# Patient Record
Sex: Female | Born: 1955 | Race: Black or African American | Hispanic: No | Smoking: Current every day smoker
Health system: Southern US, Community
[De-identification: ages and names within clinical notes are randomized; demographics above are authoritative.]

## PROBLEM LIST (undated history)

## (undated) DIAGNOSIS — R0989 Other specified symptoms and signs involving the circulatory and respiratory systems: Secondary | ICD-10-CM

## (undated) DIAGNOSIS — E119 Type 2 diabetes mellitus without complications: Secondary | ICD-10-CM

## (undated) DIAGNOSIS — F1020 Alcohol dependence, uncomplicated: Secondary | ICD-10-CM

## (undated) DIAGNOSIS — J449 Chronic obstructive pulmonary disease, unspecified: Secondary | ICD-10-CM

## (undated) DIAGNOSIS — D649 Anemia, unspecified: Secondary | ICD-10-CM

## (undated) DIAGNOSIS — I10 Essential (primary) hypertension: Secondary | ICD-10-CM

## (undated) DIAGNOSIS — F1027 Alcohol dependence with alcohol-induced persisting dementia: Secondary | ICD-10-CM

## (undated) DIAGNOSIS — K859 Acute pancreatitis without necrosis or infection, unspecified: Secondary | ICD-10-CM

## (undated) DIAGNOSIS — K760 Fatty (change of) liver, not elsewhere classified: Secondary | ICD-10-CM

## (undated) HISTORY — PX: ABDOMINAL HYSTERECTOMY: SHX81

---

## 2000-12-01 ENCOUNTER — Inpatient Hospital Stay (HOSPITAL_COMMUNITY): Admission: EM | Admit: 2000-12-01 | Discharge: 2000-12-04 | Payer: Self-pay | Admitting: *Deleted

## 2001-06-20 ENCOUNTER — Emergency Department (HOSPITAL_COMMUNITY): Admission: EM | Admit: 2001-06-20 | Discharge: 2001-06-20 | Payer: Self-pay | Admitting: Emergency Medicine

## 2001-06-20 ENCOUNTER — Encounter: Payer: Self-pay | Admitting: Emergency Medicine

## 2001-07-21 ENCOUNTER — Emergency Department (HOSPITAL_COMMUNITY): Admission: EM | Admit: 2001-07-21 | Discharge: 2001-07-21 | Payer: Self-pay | Admitting: Emergency Medicine

## 2001-12-28 ENCOUNTER — Encounter: Payer: Self-pay | Admitting: Emergency Medicine

## 2001-12-28 ENCOUNTER — Emergency Department (HOSPITAL_COMMUNITY): Admission: EM | Admit: 2001-12-28 | Discharge: 2001-12-28 | Payer: Self-pay | Admitting: Emergency Medicine

## 2002-01-03 ENCOUNTER — Emergency Department (HOSPITAL_COMMUNITY): Admission: EM | Admit: 2002-01-03 | Discharge: 2002-01-03 | Payer: Self-pay | Admitting: *Deleted

## 2002-06-23 ENCOUNTER — Emergency Department (HOSPITAL_COMMUNITY): Admission: EM | Admit: 2002-06-23 | Discharge: 2002-06-23 | Payer: Self-pay | Admitting: *Deleted

## 2002-06-23 ENCOUNTER — Encounter: Payer: Self-pay | Admitting: *Deleted

## 2002-11-24 ENCOUNTER — Emergency Department (HOSPITAL_COMMUNITY): Admission: EM | Admit: 2002-11-24 | Discharge: 2002-11-25 | Payer: Self-pay | Admitting: *Deleted

## 2002-11-25 ENCOUNTER — Encounter: Payer: Self-pay | Admitting: *Deleted

## 2003-09-05 ENCOUNTER — Emergency Department (HOSPITAL_COMMUNITY): Admission: EM | Admit: 2003-09-05 | Discharge: 2003-09-05 | Payer: Self-pay | Admitting: Emergency Medicine

## 2005-02-17 ENCOUNTER — Emergency Department (HOSPITAL_COMMUNITY): Admission: EM | Admit: 2005-02-17 | Discharge: 2005-02-17 | Payer: Self-pay | Admitting: Emergency Medicine

## 2006-02-04 ENCOUNTER — Ambulatory Visit (HOSPITAL_COMMUNITY): Admission: RE | Admit: 2006-02-04 | Discharge: 2006-02-04 | Payer: Self-pay | Admitting: Family Medicine

## 2006-06-03 ENCOUNTER — Emergency Department (HOSPITAL_COMMUNITY): Admission: EM | Admit: 2006-06-03 | Discharge: 2006-06-03 | Payer: Self-pay | Admitting: Emergency Medicine

## 2007-06-01 ENCOUNTER — Emergency Department (HOSPITAL_COMMUNITY): Admission: EM | Admit: 2007-06-01 | Discharge: 2007-06-01 | Payer: Self-pay | Admitting: Emergency Medicine

## 2007-07-12 ENCOUNTER — Emergency Department (HOSPITAL_COMMUNITY): Admission: EM | Admit: 2007-07-12 | Discharge: 2007-07-12 | Payer: Self-pay | Admitting: Emergency Medicine

## 2007-12-06 ENCOUNTER — Emergency Department (HOSPITAL_COMMUNITY): Admission: EM | Admit: 2007-12-06 | Discharge: 2007-12-07 | Payer: Self-pay | Admitting: Emergency Medicine

## 2008-02-09 ENCOUNTER — Emergency Department (HOSPITAL_COMMUNITY): Admission: EM | Admit: 2008-02-09 | Discharge: 2008-02-09 | Payer: Self-pay | Admitting: Emergency Medicine

## 2008-04-25 ENCOUNTER — Ambulatory Visit (HOSPITAL_COMMUNITY): Admission: RE | Admit: 2008-04-25 | Discharge: 2008-04-25 | Payer: Self-pay | Admitting: Family Medicine

## 2010-10-31 NOTE — Discharge Summary (Signed)
[facility]  Patient:    Gloria Santana, Gloria Santana                      MRN: [id number] Adm. Date:  [id number] Disc. Date: [id number] Attending:  Hipolito Bayley J                           Discharge Summary  INTRODUCTION:  [name] is a 55 year old white separated female, mother of three adult children.  She was admitted voluntarily after being referred by the boss, who felt that her depression was getting out of control.  Patient has history of alcoholism and recently she was drinking enough to deserve detox.  She has history of DTs.  Medically, patient suffers from emphysema for several years and she also lost some weight.  Details are available in the admission note.  HOSPITAL COURSE:  After admitting to the ward, patient was placed on special observation and phenobarbital detox.  Vital signs were stable with normal blood pressure, respiration rate and pulse and no overt signs of withdrawal. On the June 20, still kept ruminating about the past and I decided to increase Effexor.  Patient wanted to check HIV and the test was ordered.  She tolerated detox well and, on June 21, reported good sleep, no signs of withdrawal, no dangerous ideas and decreased and discontinued phenobarbital.  On June 22, she presented with bright affect, absence of dangerous ideas, no psychosis, no side effects from medication.  No signs of withdrawal.  It was recommended that patient be discharged home and follow in outpatient treatment.  LABORATORY DATA:  Vital signs, as mentioned before, were stable throughout the hospitalization.  CBC showed slightly decreased number of white blood cells 3.9 and differentiation was basically normal.  Chemistry 17 was normal. Slight elevation of thyroid T3 uptake 13.1 with normal up to 37.  T4 and TSH was within normal limits.  Urinalysis, with exception of some cloudiness, was normal.  DIAGNOSTIC IMPRESSION: Axis I:    1. Major depression,  recurrent, moderate to severe.            2. Alcohol dependence. Axis II:   Personality disorder not otherwise specified. Axis III:  1. Emphysema.            2. History of anemia. Axis IV:   Severe stressors (related to social environment, financial problems            and health problems). Axis V:  DISCHARGE MEDICATIONS: 1. Effexor XR 75 mg, 2 q.a.m. or 1 b.i.d. 2. Seroquel 25 mg, 1/2 tablet p.r.n. for racing thoughts and insomnia. 3. Ferrous sulfate 325 mg, 1 q.d. 4. Multivitamins 1 tablet q.d.  DISCHARGE RECOMMENDATIONS:  She should not drink alcohol.  Follow with AA groups.  Can return to work on December 06, 2000.  Patient has appointment scheduled with [facility] and is supposed to call to confirm appointment which she scheduled December 14, 2000.  Patient understood instructions and, at the time of discharge, did not have any side effects from medication and, in good condition, was discharged home. DD:  01/12/01 TD:  01/15/01 Job: [id number]
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
## 2011-03-12 LAB — DIFFERENTIAL
Band Neutrophils: 1
Basophils Relative: 2 — ABNORMAL HIGH
Lymphs Abs: 2.4
Metamyelocytes Relative: 0
Myelocytes: 0
Neutro Abs: 1.7
nRBC: 0

## 2011-03-12 LAB — BASIC METABOLIC PANEL
Chloride: 91 — ABNORMAL LOW
GFR calc Af Amer: >60
Glucose, Bld: 83
Potassium: 3.7
Sodium: 124 — ABNORMAL LOW

## 2011-03-12 LAB — POCT CARDIAC MARKERS
CKMB, poc: <1 — ABNORMAL LOW
Myoglobin, poc: 41.6
Operator id: 217151
Troponin i, poc: 0.07 — ABNORMAL HIGH

## 2011-03-12 LAB — CBC
MCV: 85.7
RBC: 4.17

## 2011-10-16 ENCOUNTER — Emergency Department (HOSPITAL_COMMUNITY)
Admission: EM | Admit: 2011-10-16 | Discharge: 2011-10-16 | Disposition: A | Discharge status: Home / Self Care | Payer: Self-pay | Attending: Emergency Medicine | Admitting: Emergency Medicine

## 2011-10-16 ENCOUNTER — Encounter (HOSPITAL_COMMUNITY): Payer: Self-pay | Admitting: *Deleted

## 2011-10-16 DIAGNOSIS — J449 Chronic obstructive pulmonary disease, unspecified: Secondary | ICD-10-CM | POA: Insufficient documentation

## 2011-10-16 DIAGNOSIS — F17200 Nicotine dependence, unspecified, uncomplicated: Secondary | ICD-10-CM | POA: Insufficient documentation

## 2011-10-16 DIAGNOSIS — M79609 Pain in unspecified limb: Secondary | ICD-10-CM | POA: Insufficient documentation

## 2011-10-16 DIAGNOSIS — J4489 Other specified chronic obstructive pulmonary disease: Secondary | ICD-10-CM | POA: Insufficient documentation

## 2011-10-16 DIAGNOSIS — L089 Local infection of the skin and subcutaneous tissue, unspecified: Secondary | ICD-10-CM | POA: Insufficient documentation

## 2011-10-16 HISTORY — DX: Chronic obstructive pulmonary disease, unspecified: J44.9

## 2011-10-16 MED ORDER — MICONAZOLE NITRATE 2 % EX AERP: INHALATION_SPRAY | CUTANEOUS | Status: DC

## 2011-10-16 MED ORDER — PENICILLIN V POTASSIUM 500 MG PO TABS: ORAL_TABLET | ORAL | Status: DC

## 2011-10-16 MED ORDER — PENICILLIN V POTASSIUM 250 MG PO TABS
500.0000 mg | ORAL_TABLET | Freq: Once | ORAL | Status: AC
  Administered 2011-10-16: 500 mg via ORAL
  Filled 2011-10-16: qty 2

## 2011-10-16 MED ORDER — ONDANSETRON HCL 4 MG PO TABS
4.0000 mg | ORAL_TABLET | Freq: Once | ORAL | Status: AC
  Administered 2011-10-16: 4 mg via ORAL
  Filled 2011-10-16: qty 1

## 2011-10-16 MED ORDER — FLUCONAZOLE 100 MG PO TABS
150.0000 mg | ORAL_TABLET | Freq: Once | ORAL | Status: AC
  Administered 2011-10-16: 150 mg via ORAL
  Filled 2011-10-16: qty 2

## 2011-10-16 NOTE — ED Provider Notes (Addendum)
History     CSN: [id number]  Arrival date & time 10/16/11  2126   First MD Initiated Contact with Patient 10/16/11 2148      Chief Complaint  Patient presents with  . Foot Pain    (Consider location/radiation/quality/duration/timing/severity/associated sxs/prior treatment) HPI Comments: Pt reports a 6 month hx of problems with soreness to the plantar surface of the right foot. Most recently she has noted lesions between the toes and now has some increase redness of the dorsum of the feet. No fever reported. Pain with walking at times.  The history is provided by the patient.    Past Medical History  Diagnosis Date  . COPD (chronic obstructive pulmonary disease)     Past Surgical History  Procedure Date  . Abdominal hysterectomy     Family History  Problem Relation Age of Onset  . Diabetes Mother     History  Substance Use Topics  . Smoking status: Current Everyday Smoker  . Smokeless tobacco: Not on file  . Alcohol Use: No    OB History    Grav Para Term Preterm Abortions TAB SAB Ect Mult Living                  Review of Systems  Constitutional: Negative for activity change.       All ROS Neg except as noted in HPI  HENT: Negative for nosebleeds and neck pain.   Eyes: Negative for photophobia and discharge.  Respiratory: Positive for shortness of breath. Negative for cough and wheezing.   Cardiovascular: Negative for chest pain and palpitations.  Gastrointestinal: Negative for abdominal pain and blood in stool.  Genitourinary: Negative for dysuria, frequency and hematuria.  Musculoskeletal: Negative for back pain and arthralgias.  Skin: Negative.   Neurological: Negative for dizziness, seizures and speech difficulty.  Psychiatric/Behavioral: Negative for hallucinations and confusion.    Allergies  Review of patient's allergies indicates no known allergies.  Home Medications  No current outpatient prescriptions on file.  BP 134/84  Pulse 69   Temp(Src) 97.4 F (36.3 C) (Oral)  Resp 20  Ht 5\' 7"  (1.702 m)  Wt 120 lb (54.432 kg)  BMI 18.79 kg/m2  SpO2 100%  Physical Exam  Nursing note and vitals reviewed. Constitutional: She is oriented to person, place, and time. She appears well-developed and well-nourished.  Non-toxic appearance.  HENT:  Head: Normocephalic.  Right Ear: Tympanic membrane and external ear normal.  Left Ear: Tympanic membrane and external ear normal.  Eyes: EOM and lids are normal. Pupils are equal, round, and reactive to light.  Neck: Normal range of motion. Neck supple. Carotid bruit is not present.  Cardiovascular: Normal rate, regular rhythm, normal heart sounds, intact distal pulses and normal pulses.   Pulmonary/Chest: Breath sounds normal. No respiratory distress.       Course breath sounds. No rales or wheezes.  Abdominal: Soft. Bowel sounds are normal. There is no tenderness. There is no guarding.  Musculoskeletal: Normal range of motion.       Scaling of skin of the plantar surface of the rt and left feet. Lesions between 2nd and 3rd toes. Mild to mod increase redness of the dorsum of the right foot. No streaking. No hot. DP difficult to palpate.  Lymphadenopathy:       Head (right side): No submandibular adenopathy present.       Head (left side): No submandibular adenopathy present.    She has no cervical adenopathy.  Neurological: She is alert and  oriented to person, place, and time. She has normal strength. No cranial nerve deficit or sensory deficit.  Skin: Skin is warm and dry.  Psychiatric: She has a normal mood and affect. Her speech is normal.    ED Course  Procedures: BEDSIDE DOPPLER OF LOWER EXTREMITIES -  The patient has a monophasic signal of the right and left dorsalis pedis. The left posterior tibial pulse is also monophasic. The right posterior tibial pulse is biphasic.  Labs Reviewed - No data to display No results found.   No diagnosis found.    MDM  I have reviewed  nursing notes, vital signs, and all appropriate lab and imaging results for this patient.  Pt believed to have a fungal infection with a secondary infection present. Will treat with diflucan and antifungal powder and penicillin. Pt to have feet rechecked in 3 days.       Kathie Dike, PA 10/16/11 2248  Kathie Dike, PA 01/08/12 0200  Kathie Dike, PA 02/12/12 1150

## 2011-10-16 NOTE — ED Provider Notes (Signed)
Medical screening examination/treatment/procedure(s) were performed by non-physician practitioner and as supervising physician I was immediately available for consultation/collaboration.   Glynn Octave, MD 10/16/11 2340

## 2011-10-16 NOTE — ED Notes (Signed)
Pt presents with foul odor rash in between toes on rt foot. Pt denies pain at this time however states is just uncomfortable, burning and itches. Pt denies being a diabetic. No open sores or ulcers noted. Pt reports standing at work all day and wears tennis shoes.

## 2011-10-16 NOTE — ED Notes (Signed)
Sore areas to plantar surface of rt foot and between toes for 6 mos.  Has been soaking foot.and using cremes.

## 2011-10-16 NOTE — Discharge Instructions (Signed)
Please dry feet carefully. Apply miconazole powder each morning and evening. Penicillin 2 times daily after a meal. Use a baby aspirin daily. Please return to the Emergency Dept. On Monday May 6th for recheck of your feet.

## 2011-10-18 ENCOUNTER — Other Ambulatory Visit: Payer: Self-pay

## 2012-01-08 NOTE — ED Provider Notes (Signed)
Medical screening examination/treatment/procedure(s) were performed by non-physician practitioner and as supervising physician I was immediately available for consultation/collaboration.   Glynn Octave, MD 01/08/12 [phone]

## 2012-02-15 NOTE — ED Provider Notes (Signed)
Medical screening examination/treatment/procedure(s) were performed by non-physician practitioner and as supervising physician I was immediately available for consultation/collaboration.   Raynald Rouillard, MD 02/15/12 1501 

## 2012-06-16 ENCOUNTER — Emergency Department (HOSPITAL_COMMUNITY)
Admission: EM | Admit: 2012-06-16 | Discharge: 2012-06-16 | Disposition: A | Discharge status: Home / Self Care | Payer: Self-pay | Attending: Emergency Medicine | Admitting: Emergency Medicine

## 2012-06-16 ENCOUNTER — Emergency Department (HOSPITAL_COMMUNITY): Payer: Self-pay

## 2012-06-16 ENCOUNTER — Encounter (HOSPITAL_COMMUNITY): Payer: Self-pay | Admitting: *Deleted

## 2012-06-16 DIAGNOSIS — J4489 Other specified chronic obstructive pulmonary disease: Secondary | ICD-10-CM | POA: Insufficient documentation

## 2012-06-16 DIAGNOSIS — Y92009 Unspecified place in unspecified non-institutional (private) residence as the place of occurrence of the external cause: Secondary | ICD-10-CM | POA: Insufficient documentation

## 2012-06-16 DIAGNOSIS — S1093XA Contusion of unspecified part of neck, initial encounter: Secondary | ICD-10-CM | POA: Insufficient documentation

## 2012-06-16 DIAGNOSIS — W1809XA Striking against other object with subsequent fall, initial encounter: Secondary | ICD-10-CM | POA: Insufficient documentation

## 2012-06-16 DIAGNOSIS — F17200 Nicotine dependence, unspecified, uncomplicated: Secondary | ICD-10-CM | POA: Insufficient documentation

## 2012-06-16 DIAGNOSIS — R1110 Vomiting, unspecified: Secondary | ICD-10-CM | POA: Insufficient documentation

## 2012-06-16 DIAGNOSIS — J449 Chronic obstructive pulmonary disease, unspecified: Secondary | ICD-10-CM | POA: Insufficient documentation

## 2012-06-16 DIAGNOSIS — F1010 Alcohol abuse, uncomplicated: Secondary | ICD-10-CM

## 2012-06-16 DIAGNOSIS — Y9301 Activity, walking, marching and hiking: Secondary | ICD-10-CM | POA: Insufficient documentation

## 2012-06-16 DIAGNOSIS — S0093XA Contusion of unspecified part of head, initial encounter: Secondary | ICD-10-CM

## 2012-06-16 DIAGNOSIS — S0003XA Contusion of scalp, initial encounter: Secondary | ICD-10-CM | POA: Insufficient documentation

## 2012-06-16 NOTE — ED Provider Notes (Signed)
History  This chart was scribed for Ward Givens, MD by Bennett Scrape, ED Scribe. This patient was seen in room APA16A/APA16A and the patient's care was started at 4:31 PM.  CSN: [id number]  Arrival date & time 06/16/12  1616   First MD Initiated Contact with Patient 06/16/12 1631      Chief Complaint  Patient presents with  . Headache     Patient is a 57 y.o. female presenting with headaches. The history is provided by the patient. No language interpreter was used.  Headache  This is a new problem. The current episode started 2 days ago. The problem has not changed since onset.The headache is associated with nothing. The pain is located in the right unilateral region. The pain does not radiate. Associated symptoms include vomiting. Pertinent negatives include no fever, no shortness of breath and no nausea. She has tried nothing for the symptoms.    Gloria Santana is a 57 y.o. female who presents to the Emergency Department complaining of intermittent HAs described as pressure lasting 10 to 15 minutes each after hitting the right side of her head after a fall 2 days ago while trying to walk to her home bathroom. She reports that she hit her head on her dresser and states that she had been drinking alcohol that night. She denies LOC.  She reports that eating improves her HA. She denies anything making it feel better, but hasn't tried anything. She denies photophobia or noise sensitvity.  She reports 2 episodes of emesis that is not always with the HAs for the past 2 days. She reports that the HAs are similar to prior HAs and she denies taking OTC medications at home to improve her symptoms. She denies neck pain, trouble walking, visual disturbances, diarrhea, abdominal pain    She has a h/o COPD and denies being on daily medciations. She is a current everyday 2 ppd smoker and everyday alcohol user (2 to 3 40oz beers daily). Pt reports that she has been drinking more than that since losing her  job 6 months ago. She states that she has been to detox for 3 days several years ago but resumed drinking.  [facility] is [facility] at the [facility].  Past Medical History  Diagnosis Date  . COPD (chronic obstructive pulmonary disease)     Past Surgical History  Procedure Date  . Abdominal hysterectomy     Family History  Problem Relation Age of Onset  . Diabetes Mother     History  Substance Use Topics  . Smoking status: Current Every Day Smoker  . Smokeless tobacco: Not on file  . Alcohol Use: Yes  Used to work at [employer] since July Lives at home Lives with daughters  No OB history provided.  Review of Systems  Constitutional: Negative for fever and chills.  Eyes: Negative for visual disturbance.  Respiratory: Negative for shortness of breath.   Cardiovascular: Negative for chest pain.  Gastrointestinal: Positive for vomiting. Negative for nausea, abdominal pain and diarrhea.  Neurological: Positive for headaches. Negative for dizziness and syncope.  All other systems reviewed and are negative.    Allergies  Review of patient's allergies indicates no known allergies.  Home Medications   No current outpatient prescriptions on file.  Triage Vitals: BP 127/95  Pulse 93  Temp 97.9 F (36.6 C) (Oral)  Resp 18  Ht 5\' 9"  (1.753 m)  Wt 120 lb (54.432 kg)  BMI 17.72 kg/m2  SpO2 99%  Vital signs normal    Physical Exam  Nursing note and vitals reviewed. Constitutional: She is oriented to person, place, and time. She appears well-developed and well-nourished.  Non-toxic appearance. She does not appear ill. No distress.  HENT:  Head: Normocephalic and atraumatic.  Right Ear: External ear normal.  Left Ear: External ear normal.  Nose: Nose normal. No mucosal edema or rhinorrhea.  Mouth/Throat: Oropharynx is clear and moist and mucous membranes are normal. No dental abscesses or uvula swelling.  Eyes: Conjunctivae normal and EOM are normal. Pupils  are equal, round, and reactive to light.  Neck: Normal range of motion and full passive range of motion without pain. Neck supple.  Cardiovascular: Normal rate, regular rhythm and normal heart sounds.  Exam reveals no gallop and no friction rub.   No murmur heard. Pulmonary/Chest: Effort normal. No respiratory distress. She has no wheezes. She has no rhonchi. She has no rales. She exhibits no tenderness and no crepitus.       Diminished lung sounds and diffuse rhonchi  Abdominal: Soft. Normal appearance and bowel sounds are normal. She exhibits no distension. There is no tenderness. There is no rebound and no guarding.  Musculoskeletal: Normal range of motion. She exhibits no edema and no tenderness.       Moves all extremities well.   Neurological: She is alert and oriented to person, place, and time. She has normal strength. No cranial nerve deficit.  Skin: Skin is warm, dry and intact. No rash noted. No erythema. No pallor.  Psychiatric: She has a normal mood and affect. Her speech is normal and behavior is normal. Her mood appears not anxious.    ED Course  Procedures (including critical care time)  DIAGNOSTIC STUDIES: Oxygen Saturation is 99% on room air, normal by my interpretation.    COORDINATION OF CARE: 4:56 PM- Advised pt that she needs to quit smoking and drinking alcohol. Pt turned down detox stating that she can quit drinking on her own; however, family states that they will try to convince her to take the help. Pt went to [facility] in [place] 3 years ago, but doesn't want to go back now. Discussed treatment plan which includes CT scan in the head with pt at bedside and pt agreed to plan.  6:47 PM- CT has resulted is PACS and is negative but has not crossed over into EPIC  6:54 PM-Pt rechecked and feels improved. Informed pt of CT results. Pt is still declining detox help. Discussed discharge plan with pt and pt agreed.    CT head Negative for acute intracranial  problem.   1. Contusion of head   2. Alcohol abuse    Plan discharge  Devoria Albe, MD, FACEP    MDM   I personally performed the services described in this documentation, which was scribed in my presence. The recorded information has been reviewed and considered.  Devoria Albe, MD, FACEP        Ward Givens, MD 06/16/12 [phone]

## 2012-06-16 NOTE — ED Notes (Signed)
Fell 2 days ago , struck lt side of head on dresser.  Headache since then.  No n/v.

## 2012-09-30 ENCOUNTER — Emergency Department (HOSPITAL_COMMUNITY)
Admission: EM | Admit: 2012-09-30 | Discharge: 2012-09-30 | Disposition: A | Discharge status: Home / Self Care | Payer: No Typology Code available for payment source | Attending: Emergency Medicine | Admitting: Emergency Medicine

## 2012-09-30 ENCOUNTER — Encounter (HOSPITAL_COMMUNITY): Payer: Self-pay | Admitting: *Deleted

## 2012-09-30 DIAGNOSIS — J449 Chronic obstructive pulmonary disease, unspecified: Secondary | ICD-10-CM | POA: Insufficient documentation

## 2012-09-30 DIAGNOSIS — F17200 Nicotine dependence, unspecified, uncomplicated: Secondary | ICD-10-CM | POA: Insufficient documentation

## 2012-09-30 DIAGNOSIS — J3489 Other specified disorders of nose and nasal sinuses: Secondary | ICD-10-CM | POA: Insufficient documentation

## 2012-09-30 DIAGNOSIS — J329 Chronic sinusitis, unspecified: Secondary | ICD-10-CM | POA: Insufficient documentation

## 2012-09-30 DIAGNOSIS — R040 Epistaxis: Secondary | ICD-10-CM | POA: Insufficient documentation

## 2012-09-30 DIAGNOSIS — J4489 Other specified chronic obstructive pulmonary disease: Secondary | ICD-10-CM | POA: Insufficient documentation

## 2012-09-30 LAB — CBC WITH DIFFERENTIAL/PLATELET
Basophils Absolute: 0 10*3/uL (ref 0.0–0.1)
Eosinophils Absolute: 0.1 10*3/uL (ref 0.0–0.7)
Eosinophils Relative: 3 % (ref 0–5)
Hemoglobin: 12 g/dL (ref 12.0–15.0)
Lymphs Abs: 1.4 10*3/uL (ref 0.7–4.0)
MCH: 28.3 pg (ref 26.0–34.0)
MCHC: 34.9 g/dL (ref 30.0–36.0)
Monocytes Relative: 12 % (ref 3–12)
Neutrophils Relative %: 40 % — ABNORMAL LOW (ref 43–77)
RBC: 4.24 MIL/uL (ref 3.87–5.11)

## 2012-09-30 LAB — PROTIME-INR
INR: 1.18 (ref 0.00–1.49)
Prothrombin Time: 14.8 seconds (ref 11.6–15.2)

## 2012-09-30 LAB — BASIC METABOLIC PANEL
BUN: 8 mg/dL (ref 6–23)
Glucose, Bld: 83 mg/dL (ref 70–99)
Potassium: 4.3 mEq/L (ref 3.5–5.1)

## 2012-09-30 MED ORDER — AZITHROMYCIN 250 MG PO TABS: ORAL_TABLET | ORAL | Status: DC

## 2012-09-30 MED ORDER — SILVER NITRATE-POT NITRATE 75-25 % EX MISC
1.0000 | Freq: Once | CUTANEOUS | Status: AC
  Administered 2012-09-30: 1 via TOPICAL
  Filled 2012-09-30: qty 1

## 2012-09-30 NOTE — ED Notes (Addendum)
Nosebleed from lt nostril  X 4 episodes   For 1 week.    Nasal congestion with yellow mucus.   No bleeding at present

## 2012-09-30 NOTE — ED Notes (Signed)
Pt seen and evaluated by EDPa for initial assessment. 

## 2012-10-02 NOTE — ED Provider Notes (Signed)
History     CSN: [id number]  Arrival date & time 09/30/12  1540   First MD Initiated Contact with Patient 09/30/12 1629      Chief Complaint  Patient presents with  . Epistaxis    (Consider location/radiation/quality/duration/timing/severity/associated sxs/prior treatment) Patient is a 57 y.o. female presenting with nosebleeds. The history is provided by the patient.  Epistaxis  This is a new problem. The current episode started more than 2 days ago. Episode frequency: intermittently. The problem has not changed since onset.The problem is associated with an unknown (sinus pressure, nasal congestion, sneezing) factor. The bleeding has been from the left nare. She has tried applying pressure for the symptoms. The treatment provided mild relief. Her past medical history is significant for colds and sinus problems. Her past medical history does not include nose-picking or frequent nosebleeds.    Past Medical History  Diagnosis Date  . COPD (chronic obstructive pulmonary disease)     Past Surgical History  Procedure Laterality Date  . Abdominal hysterectomy      Family History  Problem Relation Age of Onset  . Diabetes Mother     History  Substance Use Topics  . Smoking status: Current Every Day Smoker  . Smokeless tobacco: Not on file  . Alcohol Use: Yes    OB History   Grav Para Term Preterm Abortions TAB SAB Ect Mult Living                  Review of Systems  Constitutional: Negative for fever, chills, activity change and appetite change.  HENT: Positive for nosebleeds, congestion, rhinorrhea, sneezing and sinus pressure. Negative for sore throat, facial swelling, trouble swallowing, neck pain, neck stiffness and tinnitus.   Respiratory: Negative for chest tightness and shortness of breath.   Cardiovascular: Negative for chest pain.  Skin: Negative for rash.  Neurological: Negative for syncope, facial asymmetry, speech difficulty, weakness, light-headedness,  numbness and headaches.  Hematological: Does not bruise/bleed easily.  All other systems reviewed and are negative.    Allergies  Review of patient's allergies indicates no known allergies.  Home Medications   Current Outpatient Rx  Name  Route  Sig  Dispense  Refill  . aspirin-sod bicarb-citric acid (ALKA-SELTZER) 325 MG TBEF   Oral   Take 325 mg by mouth daily as needed.         Marland Kitchen ibuprofen (ADVIL,MOTRIN) 200 MG tablet   Oral   Take 400 mg by mouth daily as needed for pain.         . Multiple Vitamin (MULTIVITAMIN WITH MINERALS) TABS   Oral   Take 1 tablet by mouth daily.         Marland Kitchen azithromycin (ZITHROMAX Z-PAK) 250 MG tablet      Take two tablets on day one, then one tab qd days 2-5   6 tablet   0     BP 128/93  Pulse 90  Temp(Src) 98.4 F (36.9 C) (Oral)  Resp 18  Ht 5' 7.5" (1.715 m)  Wt 120 lb (54.432 kg)  BMI 18.51 kg/m2  SpO2 100%  Physical Exam  Nursing note and vitals reviewed. Constitutional: She is oriented to person, place, and time. She appears well-developed and well-nourished. No distress.  HENT:  Head: Normocephalic and atraumatic. No trismus in the jaw.  Right Ear: Tympanic membrane and ear canal normal.  Left Ear: Tympanic membrane and ear canal normal.  Nose: Mucosal edema and rhinorrhea present. No septal deviation or nasal septal hematoma.  Epistaxis is observed. Right sinus exhibits maxillary sinus tenderness. Left sinus exhibits maxillary sinus tenderness.  Mouth/Throat: Uvula is midline and mucous membranes are normal. No edematous. No oropharyngeal exudate, posterior oropharyngeal edema, posterior oropharyngeal erythema or tonsillar abscesses.  Abrasion to left septum.  Scant bleeding present.    Neck: Normal range of motion and phonation normal. Neck supple. No Brudzinski's sign and no Kernig's sign noted.  Cardiovascular: Normal rate, regular rhythm, normal heart sounds and intact distal pulses.   No murmur  heard. Pulmonary/Chest: Effort normal and breath sounds normal. She has no wheezes. She has no rales.  Musculoskeletal: Normal range of motion. She exhibits no edema.  Lymphadenopathy:    She has no cervical adenopathy.  Neurological: She is alert and oriented to person, place, and time. She exhibits normal muscle tone. Coordination normal.  Skin: Skin is warm and dry.    ED Course  EPISTAXIS MANAGEMENT Date/Time: 09/30/2012 6:10 PM Performed by: Trisha Mangle, Jaymason Ledesma L. Authorized by: Maxwell Caul Consent: Verbal consent obtained. written consent not obtained. Consent given by: patient Patient understanding: patient states understanding of the procedure being performed Patient identity confirmed: verbally with patient and arm band Time out: Immediately prior to procedure a "time out" was called to verify the correct patient, procedure, equipment, support staff and site/side marked as required. Anesthesia method: none. Patient sedated: no Treatment site: left anterior Repair method: silver nitrate Post-procedure assessment: bleeding stopped Treatment complexity: simple Patient tolerance: Patient tolerated the procedure well with no immediate complications.   (including critical care time)  Labs Reviewed  CBC WITH DIFFERENTIAL - Abnormal; Notable for the following:    WBC 3.1 (*)    HCT 34.4 (*)    Neutrophils Relative 40 (*)    Neutro Abs 1.2 (*)    All other components within normal limits  PROTIME-INR  BASIC METABOLIC PANEL     1. Left-sided epistaxis   2. Sinusitis       MDM     Minimal epistaxis of the left nostril secondary to septal abrasion.  Bleeding resolved after application of silver nitrate.  Airway patent.  Pt advised to avoid blowing her nose or sneezing for 24-48 hrs.  She agrees to return here if sx's return.  abrasion likely occurred secondary to excessive sneezing and blowing her nose.  PT, H&H are nml, no vertiginous sx's.  Pt is well appearing  and appears stable for discharge.    Tkai Large L. Trisha Mangle, PA-C 10/02/12 1916

## 2012-10-05 NOTE — ED Provider Notes (Signed)
Medical screening examination/treatment/procedure(s) were performed by non-physician practitioner and as supervising physician I was immediately available for consultation/collaboration. Kalvyn Desa, MD, FACEP   Chancelor Hardrick L Shlome Baldree, MD 10/05/12 1050 

## 2013-01-16 ENCOUNTER — Encounter (HOSPITAL_COMMUNITY): Payer: Self-pay

## 2013-01-16 ENCOUNTER — Emergency Department (HOSPITAL_COMMUNITY)
Admission: EM | Admit: 2013-01-16 | Discharge: 2013-01-16 | Discharge status: Against Medical Advice | Payer: No Typology Code available for payment source | Attending: Emergency Medicine | Admitting: Emergency Medicine

## 2013-01-16 ENCOUNTER — Emergency Department (HOSPITAL_COMMUNITY): Payer: No Typology Code available for payment source

## 2013-01-16 DIAGNOSIS — J4489 Other specified chronic obstructive pulmonary disease: Secondary | ICD-10-CM | POA: Insufficient documentation

## 2013-01-16 DIAGNOSIS — IMO0002 Reserved for concepts with insufficient information to code with codable children: Secondary | ICD-10-CM | POA: Insufficient documentation

## 2013-01-16 DIAGNOSIS — R062 Wheezing: Secondary | ICD-10-CM | POA: Insufficient documentation

## 2013-01-16 DIAGNOSIS — Y939 Activity, unspecified: Secondary | ICD-10-CM | POA: Insufficient documentation

## 2013-01-16 DIAGNOSIS — Y92009 Unspecified place in unspecified non-institutional (private) residence as the place of occurrence of the external cause: Secondary | ICD-10-CM | POA: Insufficient documentation

## 2013-01-16 DIAGNOSIS — F17200 Nicotine dependence, unspecified, uncomplicated: Secondary | ICD-10-CM | POA: Insufficient documentation

## 2013-01-16 DIAGNOSIS — S21209A Unspecified open wound of unspecified back wall of thorax without penetration into thoracic cavity, initial encounter: Secondary | ICD-10-CM | POA: Insufficient documentation

## 2013-01-16 DIAGNOSIS — S298XXA Other specified injuries of thorax, initial encounter: Secondary | ICD-10-CM | POA: Insufficient documentation

## 2013-01-16 DIAGNOSIS — W19XXXA Unspecified fall, initial encounter: Secondary | ICD-10-CM

## 2013-01-16 DIAGNOSIS — S41109A Unspecified open wound of unspecified upper arm, initial encounter: Secondary | ICD-10-CM | POA: Insufficient documentation

## 2013-01-16 DIAGNOSIS — Z79899 Other long term (current) drug therapy: Secondary | ICD-10-CM | POA: Insufficient documentation

## 2013-01-16 DIAGNOSIS — J449 Chronic obstructive pulmonary disease, unspecified: Secondary | ICD-10-CM | POA: Insufficient documentation

## 2013-01-16 DIAGNOSIS — W1809XA Striking against other object with subsequent fall, initial encounter: Secondary | ICD-10-CM | POA: Insufficient documentation

## 2013-01-16 MED ORDER — TETANUS-DIPHTH-ACELL PERTUSSIS 5-2.5-18.5 LF-MCG/0.5 IM SUSP: 0.5000 mL | Freq: Once | INTRAMUSCULAR | Status: DC

## 2013-01-16 NOTE — ED Provider Notes (Signed)
Medical screening examination/treatment/procedure(s) were performed by non-physician practitioner and as supervising physician I was immediately available for consultation/collaboration.  Doug Sou, MD 01/16/13 [phone]
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
## 2013-01-16 NOTE — ED Notes (Signed)
Pt is out in hallway, says she can take care of herself.and walked out.
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
## 2013-04-13 ENCOUNTER — Emergency Department (HOSPITAL_COMMUNITY)
Admission: EM | Admit: 2013-04-13 | Discharge: 2013-04-13 | Disposition: A | Discharge status: Home / Self Care | Payer: No Typology Code available for payment source | Attending: Emergency Medicine | Admitting: Emergency Medicine

## 2013-04-13 ENCOUNTER — Emergency Department (HOSPITAL_COMMUNITY): Payer: No Typology Code available for payment source

## 2013-04-13 ENCOUNTER — Encounter (HOSPITAL_COMMUNITY): Payer: Self-pay | Admitting: Emergency Medicine

## 2013-04-13 ENCOUNTER — Emergency Department (HOSPITAL_COMMUNITY): Payer: Self-pay

## 2013-04-13 DIAGNOSIS — J40 Bronchitis, not specified as acute or chronic: Secondary | ICD-10-CM

## 2013-04-13 DIAGNOSIS — J449 Chronic obstructive pulmonary disease, unspecified: Secondary | ICD-10-CM

## 2013-04-13 DIAGNOSIS — J441 Chronic obstructive pulmonary disease with (acute) exacerbation: Secondary | ICD-10-CM | POA: Insufficient documentation

## 2013-04-13 DIAGNOSIS — F17200 Nicotine dependence, unspecified, uncomplicated: Secondary | ICD-10-CM | POA: Insufficient documentation

## 2013-04-13 DIAGNOSIS — E041 Nontoxic single thyroid nodule: Secondary | ICD-10-CM | POA: Insufficient documentation

## 2013-04-13 LAB — BASIC METABOLIC PANEL
CO2: 29 mEq/L (ref 19–32)
Calcium: 9.8 mg/dL (ref 8.4–10.5)
Creatinine, Ser: 0.73 mg/dL (ref 0.50–1.10)
GFR calc Af Amer: >90 mL/min (ref >90)
GFR calc non Af Amer: >90 mL/min (ref >90)
Glucose, Bld: 77 mg/dL (ref 70–99)
Sodium: 137 mEq/L (ref 135–145)

## 2013-04-13 LAB — CBC WITH DIFFERENTIAL/PLATELET
Basophils Absolute: 0 10*3/uL (ref 0.0–0.1)
Basophils Relative: 1 % (ref 0–1)
Eosinophils Relative: 3 % (ref 0–5)
MCH: 28.9 pg (ref 26.0–34.0)
MCV: 83.8 fL (ref 78.0–100.0)
Monocytes Absolute: 0.6 10*3/uL (ref 0.1–1.0)
Monocytes Relative: 16 % — ABNORMAL HIGH (ref 3–12)
Neutrophils Relative %: 33 % — ABNORMAL LOW (ref 43–77)

## 2013-04-13 MED ORDER — PREDNISONE 20 MG PO TABS: ORAL_TABLET | ORAL | Status: DC

## 2013-04-13 MED ORDER — AZITHROMYCIN 250 MG PO TABS: ORAL_TABLET | ORAL | Status: DC

## 2013-04-13 MED ORDER — IOHEXOL 300 MG/ML  SOLN
80.0000 mL | Freq: Once | INTRAMUSCULAR | Status: AC | PRN
  Administered 2013-04-13: 80 mL via INTRAVENOUS

## 2013-04-13 MED ORDER — SODIUM CHLORIDE 0.9 % IV BOLUS (SEPSIS)
500.0000 mL | Freq: Once | INTRAVENOUS | Status: AC
  Administered 2013-04-13: 500 mL via INTRAVENOUS

## 2013-04-13 MED ORDER — ALBUTEROL SULFATE HFA 108 (90 BASE) MCG/ACT IN AERS
2.0000 | INHALATION_SPRAY | Freq: Once | RESPIRATORY_TRACT | Status: AC
  Administered 2013-04-13: 2 via RESPIRATORY_TRACT
  Filled 2013-04-13: qty 6.7

## 2013-04-13 MED ORDER — HYDROCODONE-HOMATROPINE 5-1.5 MG/5ML PO SYRP: 5.0000 mL | ORAL_SOLUTION | Freq: Four times a day (QID) | ORAL | Status: DC | PRN

## 2013-04-13 MED ORDER — HYDROCOD POLST-CHLORPHEN POLST 10-8 MG/5ML PO LQCR
5.0000 mL | Freq: Once | ORAL | Status: AC
  Administered 2013-04-13: 5 mL via ORAL
  Filled 2013-04-13: qty 5

## 2013-04-13 NOTE — Progress Notes (Signed)
ED/CM noted patient did not have health insurance and/or PCP listed in the computer.  Patient was given the [place] resource handout with information on the clinics, food pantries, and the handout for new health insurance sign-up.  Patient expressed appreciation for this. 

## 2013-04-13 NOTE — ED Notes (Signed)
Coughing times two weeks.  Getting worse.

## 2013-04-13 NOTE — ED Provider Notes (Signed)
CSN: [id number]     Arrival date & time 04/13/13  1500 History  This chart was scribed for Donnetta Hutching, MD by Dorothey Baseman, ED Scribe. This patient was seen in room APA01/[facility]01 and the patient's care was started at 3:17 PM.    Chief Complaint  Patient presents with  . Cough   The history is provided by the patient and a relative. No language interpreter was used.   HPI Comments: Gloria Santana is a 57 y.o. female who presents to the Emergency Department complaining of a cough with yellow sputum and associated congestion onset intermittently for the past 3 weeks. Patient also reports one episode of emesis on Tuesday, 2 days ago, with some exacerbation of shortness of breath. She denies fever. She reports a history of COPD. Patient is a current every day smoker, 1.5 PPD.   Past Medical History  Diagnosis Date  . COPD (chronic obstructive pulmonary disease)    Past Surgical History  Procedure Laterality Date  . Abdominal hysterectomy     Family History  Problem Relation Age of Onset  . Diabetes Mother    History  Substance Use Topics  . Smoking status: Current Every Day Smoker  . Smokeless tobacco: Not on file  . Alcohol Use: Yes   OB History   Grav Para Term Preterm Abortions TAB SAB Ect Mult Living                 Review of Systems  A complete 10 system review of systems was obtained and all systems are negative except as noted in the HPI and PMH.   Allergies  Review of patient's allergies indicates no known allergies.  Home Medications   Current Outpatient Rx  Name  Route  Sig  Dispense  Refill  . aspirin-sod bicarb-citric acid (ALKA-SELTZER) 325 MG TBEF   Oral   Take 325 mg by mouth daily as needed.         Marland Kitchen azithromycin (ZITHROMAX Z-PAK) 250 MG tablet      Take two tablets on day one, then one tab qd days 2-5   6 tablet   0   . ibuprofen (ADVIL,MOTRIN) 200 MG tablet   Oral   Take 400 mg by mouth daily as needed for pain.         . Multiple Vitamin  (MULTIVITAMIN WITH MINERALS) TABS   Oral   Take 1 tablet by mouth daily.          Triage Vitals: BP 124/89  Pulse 88  Temp(Src) 97.4 F (36.3 C) (Oral)  Resp 16  Ht 5\' 7"  (1.702 m)  Wt 112 lb (50.803 kg)  BMI 17.54 kg/m2  SpO2 98%  Physical Exam  Nursing note and vitals reviewed. Constitutional: She is oriented to person, place, and time. She appears well-developed and well-nourished.  HENT:  Head: Normocephalic and atraumatic.  Eyes: Conjunctivae and EOM are normal. Pupils are equal, round, and reactive to light.  Neck: Normal range of motion. Neck supple.  Cardiovascular: Normal rate, regular rhythm and normal heart sounds.   Pulmonary/Chest: Effort normal and breath sounds normal. No respiratory distress. She has no wheezes.  Abdominal: Soft. Bowel sounds are normal.  Musculoskeletal: Normal range of motion.  Neurological: She is alert and oriented to person, place, and time.  Skin: Skin is warm and dry.  Psychiatric: She has a normal mood and affect.    ED Course  Procedures (including critical care time)  DIAGNOSTIC STUDIES: Oxygen Saturation is 98%  on room air, normal by my interpretation.    COORDINATION OF CARE: 3:20 PM- Will order a chest x-ray, antibiotics, and a prednisone nebulizer. Advised patient to quit smoking. Discussed treatment plan with patient at bedside and patient verbalized agreement.    Labs Review Labs Reviewed  CBC WITH DIFFERENTIAL - Abnormal; Notable for the following:    Neutrophils Relative % 33 (*)    Neutro Abs 1.3 (*)    Lymphocytes Relative 47 (*)    Monocytes Relative 16 (*)    All other components within normal limits  BASIC METABOLIC PANEL    Imaging Review Dg Chest 2 View  04/13/2013   CLINICAL DATA:  Cough and congestion x3 weeks, history of emphysema  EXAM: CHEST  2 VIEW  COMPARISON:  Prior chest x-ray 12/06/2007  FINDINGS: Is stable cardiac and mediastinal contours. The lungs are hyperexpanded and there are central  bronchitic changes consistent with COPD/emphysema. Interval development of a 2.8 x 1.3 cm round soft tissue density nodule in the periphery of the left lower lobe. No focal airspace consolidation. No pleural effusion or pneumothorax. No acute osseous abnormality.  IMPRESSION: 1. New 2.8 x 1.3 cm pulmonary nodule in the periphery of the left lung base. Differential considerations include primary lung malignancy and metastatic pulmonary nodule. Recommend further evaluation with dedicated CT scan of the chest (can be performed on a nonemergent basis). 2. Otherwise, stable background changes of COPD and emphysema.   Electronically Signed   By: Malachy Moan M.D.   On: 04/13/2013 15:48   Ct Chest W Contrast  04/13/2013   CLINICAL DATA:  Lung nodule on chest x-ray  EXAM: CT CHEST WITH CONTRAST  TECHNIQUE: Multidetector CT imaging of the chest was performed during intravenous contrast administration.  CONTRAST:  80mL OMNIPAQUE IOHEXOL 300 MG/ML  SOLN  COMPARISON:  Chest x-ray same day  FINDINGS: Images of the thoracic inlet are unremarkable. Central airways are patent. Small calcified mediastinal and hilar lymph nodes are noted consistent wall prior granulomatous disease. Calcified left anterior mediastinal lymph nodes the largest measures 9 mm.  Heart size within normal limits. Visualized upper abdomen shows no adrenal gland mass.  No aortic aneurysm. No mediastinal hematoma. There is a low-density nodule left lobe of thyroid gland measures 7.5 mm. Further evaluation with thyroid gland ultrasound is recommended.  Images of the lung parenchyma shows no acute infiltrate or pulmonary edema. No pulmonary nodule.  Nodular density on the chest x-ray corresponds to a old appearing fracture of the left posterior 9th rib with incomplete union.  There is no pneumothorax. No pleural or pericardial effusion.  Sagittal images of the spine shows degenerative changes thoracic spine. Sagittal view of the sternum is unremarkable.   IMPRESSION: 1. No acute infiltrate or pulmonary edema. No pulmonary nodules. Nodular density on the chest x-ray corresponds to a old appearing fracture of the left posterior 9th rib with incomplete union. 2. Multiple calcified hilar and mediastinal lymph nodes consistent with prior granulomatous disease. 3. There is 7.5 mm low-density nodule left lower thyroid gland. Further evaluation with thyroid gland ultrasound is recommended.   Electronically Signed   By: Natasha Mead M.D.   On: 04/13/2013 19:10    EKG Interpretation   None       MDM  No diagnosis found. CT chest shows no obvious malignancy.   There is a 7.5 mm density in the left lower thyroid gland.  Will schedule ultrasound of same. Discharge medications Zithromax, prednisone, Albuterol inhaler, Hycodan cough syrup.  Advised to stop smoking.  I personally performed the services described in this documentation, which was scribed in my presence. The recorded information has been reviewed and is accurate.     Donnetta Hutching, MD 04/13/13 [phone]

## 2013-04-13 NOTE — ED Notes (Signed)
Patient has coughing episode - gagging and coughing - states she feels that she cannot get the mucous up, and feels as she cant breathe.  O2 sats 99-100% room air

## 2013-04-14 ENCOUNTER — Ambulatory Visit (HOSPITAL_COMMUNITY)
Admit: 2013-04-14 | Discharge: 2013-04-14 | Disposition: A | Discharge status: Home / Self Care | Payer: No Typology Code available for payment source | Source: Ambulatory Visit | Attending: Emergency Medicine | Admitting: Emergency Medicine

## 2013-04-14 DIAGNOSIS — E042 Nontoxic multinodular goiter: Secondary | ICD-10-CM | POA: Insufficient documentation

## 2013-04-14 NOTE — ED Provider Notes (Signed)
1. Three separate heterogeneous solid thyroid nodules are  identified. Findings do not meet current SRU consensus criteria for  biopsy. Follow-up by clinical exam is recommended. If patient has  known risk factors for thyroid carcinoma, consider follow-up  ultrasound in 12 months. If patient is clinically hyperthyroid,  consider nuclear medicine thyroid uptake and scan.    Reviewed [place] results.  Plan referral to endocrinologist. Patient advised.   Hilario Quarry, MD 04/14/13 [phone]

## 2013-12-08 ENCOUNTER — Encounter (HOSPITAL_COMMUNITY): Payer: Self-pay | Admitting: Emergency Medicine

## 2013-12-08 ENCOUNTER — Emergency Department (HOSPITAL_COMMUNITY): Payer: BC Managed Care – PPO

## 2013-12-08 ENCOUNTER — Emergency Department (HOSPITAL_COMMUNITY)
Admission: EM | Admit: 2013-12-08 | Discharge: 2013-12-08 | Disposition: A | Discharge status: Home / Self Care | Payer: BC Managed Care – PPO | Attending: Emergency Medicine | Admitting: Emergency Medicine

## 2013-12-08 DIAGNOSIS — E119 Type 2 diabetes mellitus without complications: Secondary | ICD-10-CM | POA: Insufficient documentation

## 2013-12-08 DIAGNOSIS — Z791 Long term (current) use of non-steroidal anti-inflammatories (NSAID): Secondary | ICD-10-CM | POA: Insufficient documentation

## 2013-12-08 DIAGNOSIS — J441 Chronic obstructive pulmonary disease with (acute) exacerbation: Secondary | ICD-10-CM | POA: Insufficient documentation

## 2013-12-08 DIAGNOSIS — Z792 Long term (current) use of antibiotics: Secondary | ICD-10-CM | POA: Insufficient documentation

## 2013-12-08 DIAGNOSIS — E089 Diabetes mellitus due to underlying condition without complications: Secondary | ICD-10-CM

## 2013-12-08 DIAGNOSIS — F17200 Nicotine dependence, unspecified, uncomplicated: Secondary | ICD-10-CM | POA: Insufficient documentation

## 2013-12-08 DIAGNOSIS — Z79899 Other long term (current) drug therapy: Secondary | ICD-10-CM | POA: Insufficient documentation

## 2013-12-08 DIAGNOSIS — IMO0002 Reserved for concepts with insufficient information to code with codable children: Secondary | ICD-10-CM | POA: Insufficient documentation

## 2013-12-08 HISTORY — DX: Other specified symptoms and signs involving the circulatory and respiratory systems: R09.89

## 2013-12-08 LAB — BASIC METABOLIC PANEL
BUN: 9 mg/dL (ref 6–23)
CO2: 22 mEq/L (ref 19–32)
Calcium: 9.5 mg/dL (ref 8.4–10.5)
Chloride: 87 mEq/L — ABNORMAL LOW (ref 96–112)
Creatinine, Ser: 0.74 mg/dL (ref 0.50–1.10)
GFR calc Af Amer: >90 mL/min (ref >90)
GFR calc non Af Amer: >90 mL/min (ref >90)
Glucose, Bld: 310 mg/dL — ABNORMAL HIGH (ref 70–99)
Potassium: 4.5 mEq/L (ref 3.7–5.3)
Sodium: 128 mEq/L — ABNORMAL LOW (ref 137–147)

## 2013-12-08 LAB — CBC WITH DIFFERENTIAL/PLATELET
BASOS PCT: 0 % (ref 0–1)
Basophils Absolute: 0 10*3/uL (ref 0.0–0.1)
EOS ABS: 0 10*3/uL (ref 0.0–0.7)
Eosinophils Relative: 0 % (ref 0–5)
HCT: 37.2 % (ref 36.0–46.0)
Hemoglobin: 13 g/dL (ref 12.0–15.0)
Lymphocytes Relative: 26 % (ref 12–46)
Lymphs Abs: 1.1 10*3/uL (ref 0.7–4.0)
MCH: 27.4 pg (ref 26.0–34.0)
MCHC: 34.9 g/dL (ref 30.0–36.0)
MCV: 78.3 fL (ref 78.0–100.0)
MONOS PCT: 15 % — AB (ref 3–12)
Monocytes Absolute: 0.6 10*3/uL (ref 0.1–1.0)
NEUTROS PCT: 59 % (ref 43–77)
Neutro Abs: 2.4 10*3/uL (ref 1.7–7.7)
Platelets: 191 10*3/uL (ref 150–400)
RBC: 4.75 MIL/uL (ref 3.87–5.11)
RDW: 13.4 % (ref 11.5–15.5)
WBC: 4 10*3/uL (ref 4.0–10.5)

## 2013-12-08 MED ORDER — METFORMIN HCL 500 MG PO TABS: 500.0000 mg | ORAL_TABLET | Freq: Every day | ORAL | Status: DC

## 2013-12-08 MED ORDER — TRAMADOL HCL 50 MG PO TABS: 50.0000 mg | ORAL_TABLET | Freq: Four times a day (QID) | ORAL | Status: DC | PRN

## 2013-12-08 NOTE — ED Provider Notes (Signed)
CSN: [id number]     Arrival date & time 12/08/13  [id number] History  This chart was scribed for Benny LennertJoseph L Zammit, MD by Shari HeritageAisha Amuda, ED Scribe. The patient was seen in room APA08/APA08. Patient's care was started at 10:10 AM.   Chief Complaint  Patient presents with  . Leg Pain    Patient is a 58 y.o. female presenting with leg pain. The history is provided by the patient. No language interpreter was used.  Leg Pain Location:  Leg Leg location:  L lower leg and R lower leg Pain details:    Quality:  Cramping (and soreness)   Radiates to:  Does not radiate   Onset quality:  Gradual   Duration:  2 weeks   Timing:  Constant   Progression:  Worsening Chronicity:  New Associated symptoms: no back pain, no fatigue, no fever, no muscle weakness, no neck pain, no numbness, no swelling and no tingling     HPI Comments: Gloria Santana is a 58 y.o. female who presents to the Emergency Department complaining of gradually worsening, constant bilateral calf pain that began 2 weeks ago. She describes pain as soreness at times and cramping at other times. Pain is worse with weight bearing and ambulation. There is no erythema, leg swelling, or numbness or weakness of the lower extremities. Patient reports that she has a history of "poor circulation" in her legs, but she has never had a venous Doppler study or other imaging to suggest PVD. She denies shortness of breath, chest pain, or any other symptoms at this time. She smokes about 0.5 packs of cigarettes per day.    Past Medical History  Diagnosis Date  . COPD (chronic obstructive pulmonary disease)    Past Surgical History  Procedure Laterality Date  . Abdominal hysterectomy     Family History  Problem Relation Age of Onset  . Diabetes Mother    History  Substance Use Topics  . Smoking status: Current Every Day Smoker  . Smokeless tobacco: Not on file  . Alcohol Use: Yes   OB History   Grav Para Term Preterm Abortions TAB SAB Ect Mult  Living                 Review of Systems  Constitutional: Negative for fever, appetite change and fatigue.  HENT: Negative for congestion, ear discharge and sinus pressure.   Eyes: Negative for discharge.  Respiratory: Negative for cough.   Cardiovascular: Negative for chest pain.  Gastrointestinal: Negative for abdominal pain and diarrhea.  Genitourinary: Negative for frequency and hematuria.  Musculoskeletal: Positive for gait problem and myalgias (bilateral calf pain). Negative for back pain and neck pain.  Skin: Negative for rash.  Neurological: Negative for seizures, weakness, numbness and headaches.  Psychiatric/Behavioral: Negative for hallucinations.    Allergies  Review of patient's allergies indicates no known allergies.  Home Medications   Prior to Admission medications   Medication Sig Start Date End Date Taking? Authorizing Provider  azithromycin (ZITHROMAX Z-PAK) 250 MG tablet 2 po day one, then 1 daily x 4 days 04/13/13   Donnetta HutchingBrian Cook, MD  HYDROcodone-homatropine [facility](HYCODAN) 5-1.5 MG/5ML syrup Take 5 mLs by mouth every 6 (six) hours as needed for cough. 04/13/13   Donnetta HutchingBrian Cook, MD  ibuprofen (ADVIL,MOTRIN) 200 MG tablet Take 400 mg by mouth daily as needed for pain.    Historical Provider, MD  predniSONE (DELTASONE) 20 MG tablet 3 tabs po day one, then 2 po daily x 4 days 04/13/13  Donnetta HutchingBrian Cook, MD   Triage Vitals: BP 131/88  Pulse 125  Temp(Src) 97.5 F (36.4 C) (Oral)  Resp 16  Ht 5\' 6"  (1.676 m)  Wt 125 lb (56.7 kg)  BMI 20.19 kg/m2  SpO2 96% Physical Exam  Constitutional: She is oriented to person, place, and time. She appears well-developed.  HENT:  Head: Normocephalic.  Eyes: Conjunctivae and EOM are normal. No scleral icterus.  Neck: Neck supple. No thyromegaly present.  Cardiovascular: Normal rate and regular rhythm.  Exam reveals no gallop and no friction rub.   No murmur heard. Pulmonary/Chest: No stridor. She has wheezes. She has no rales. She  exhibits no tenderness.  Mild wheezing bilaterally.   Abdominal: She exhibits no distension. There is no tenderness. There is no rebound.  Musculoskeletal: Normal range of motion. She exhibits no edema.  Capillary refills less 2 seconds in toes of both feet. 2+ DP pulses bilaterally. Mild tenderness of posterior legs bilaterally.   Lymphadenopathy:    She has no cervical adenopathy.  Neurological: She is oriented to person, place, and time. She exhibits normal muscle tone. Coordination normal.  Skin: No rash noted. No erythema.  Psychiatric: She has a normal mood and affect. Her behavior is normal.    ED Course  Procedures (including critical care time) DIAGNOSTIC STUDIES: Oxygen Saturation is 96% on room air, adequate by my interpretation.    COORDINATION OF CARE: 10:18 AM- Patient informed of current plan for treatment and evaluation and agrees with plan at this time.     Labs Review Labs Reviewed  CBC WITH DIFFERENTIAL - Abnormal; Notable for the following:    Monocytes Relative 15 (*)    All other components within normal limits  BASIC METABOLIC PANEL - Abnormal; Notable for the following:    Sodium 128 (*)    Chloride 87 (*)    Glucose, Bld 310 (*)    All other components within normal limits    Imaging Review Dg Chest 2 View  12/08/2013   CLINICAL DATA:  Smoker's cough.  EXAM: CHEST  2 VIEW  COMPARISON:  04/13/2013  FINDINGS: The lungs are hyperexpanded with flattening of the hemidiaphragms. There is increased lucency over the left lung as these findings are unchanged and compatible with COPD. There is no focal consolidation or effusion. Calcified mediastinal and hilar lymph nodes are present. Cardiomediastinal silhouette and remainder the exam is unchanged.  IMPRESSION: No active cardiopulmonary disease.  COPD.   Electronically Signed   By: Elberta Fortisaniel  Boyle M.D.   On: 12/08/2013 11:54     EKG Interpretation None      MDM   Final diagnoses:  None   Diabetes   Start  glucophange.  Ultram for leg pain The chart was scribed for me under my direct supervision.  I personally performed the history, physical, and medical decision making and all procedures in the evaluation of this patient.Benny Lennert.   Joseph L Zammit, MD 12/08/13 1315

## 2013-12-08 NOTE — ED Notes (Addendum)
Pt ambulated to RN desk and said, "I need to get going, I have to work tonight and I'm needing him to write me a note for work."  No difficulty with ambulation noted.  MD will be made aware.

## 2013-12-08 NOTE — ED Notes (Signed)
Pt reports concerns of possible lung mass that she was told she had around 4 months ago.  Pt requesting to have that looked into further.  MD Zammit made aware.

## 2013-12-08 NOTE — ED Notes (Signed)
Pt alert & oriented x4, stable gait. Patient given discharge instructions, paperwork & prescription(s). Patient  instructed to stop at the registration desk to finish any additional paperwork. Patient verbalized understanding. Pt left department w/ no further questions. 

## 2013-12-08 NOTE — Discharge Instructions (Signed)
Follow up next week for recheck °

## 2013-12-08 NOTE — ED Notes (Signed)
Pt states she has had "poor circulation" in legs for last few years. Last 2 weeks bilateral leg pain and weakness has gotten progressively worse, with activity and at rest.

## 2013-12-08 NOTE — Care Management Note (Signed)
ED/CM noted patient did not have health insurance and/or PCP listed in the computer.  Patient was given the [place] resource handout with information on the clinics, food pantries, and the handout for new health insurance sign-up.  Patient expressed appreciation for information received. 

## 2014-02-16 ENCOUNTER — Encounter (HOSPITAL_COMMUNITY): Payer: Self-pay | Admitting: Emergency Medicine

## 2014-02-16 ENCOUNTER — Emergency Department (HOSPITAL_COMMUNITY)
Admission: EM | Admit: 2014-02-16 | Discharge: 2014-02-17 | Disposition: A | Discharge status: Home / Self Care | Payer: BC Managed Care – PPO | Attending: Emergency Medicine | Admitting: Emergency Medicine

## 2014-02-16 DIAGNOSIS — I10 Essential (primary) hypertension: Secondary | ICD-10-CM | POA: Diagnosis not present

## 2014-02-16 DIAGNOSIS — E119 Type 2 diabetes mellitus without complications: Secondary | ICD-10-CM | POA: Diagnosis not present

## 2014-02-16 DIAGNOSIS — Z79899 Other long term (current) drug therapy: Secondary | ICD-10-CM | POA: Insufficient documentation

## 2014-02-16 DIAGNOSIS — E871 Hypo-osmolality and hyponatremia: Secondary | ICD-10-CM | POA: Diagnosis not present

## 2014-02-16 DIAGNOSIS — F1010 Alcohol abuse, uncomplicated: Secondary | ICD-10-CM | POA: Insufficient documentation

## 2014-02-16 DIAGNOSIS — J449 Chronic obstructive pulmonary disease, unspecified: Secondary | ICD-10-CM | POA: Insufficient documentation

## 2014-02-16 DIAGNOSIS — Z008 Encounter for other general examination: Secondary | ICD-10-CM | POA: Insufficient documentation

## 2014-02-16 DIAGNOSIS — F17200 Nicotine dependence, unspecified, uncomplicated: Secondary | ICD-10-CM | POA: Diagnosis not present

## 2014-02-16 DIAGNOSIS — J4489 Other specified chronic obstructive pulmonary disease: Secondary | ICD-10-CM | POA: Insufficient documentation

## 2014-02-16 HISTORY — DX: Alcohol dependence, uncomplicated: F10.20

## 2014-02-16 HISTORY — DX: Essential (primary) hypertension: I10

## 2014-02-16 HISTORY — DX: Type 2 diabetes mellitus without complications: E11.9

## 2014-02-16 LAB — BASIC METABOLIC PANEL
Anion gap: 18 — ABNORMAL HIGH (ref 5–15)
BUN: 4 mg/dL — ABNORMAL LOW (ref 6–23)
CHLORIDE: 87 meq/L — AB (ref 96–112)
CO2: 24 mEq/L (ref 19–32)
Calcium: 8.8 mg/dL (ref 8.4–10.5)
Creatinine, Ser: 0.72 mg/dL (ref 0.50–1.10)
GFR calc non Af Amer: >90 mL/min (ref >90)
GLUCOSE: 130 mg/dL — AB (ref 70–99)
POTASSIUM: 4 meq/L (ref 3.7–5.3)
SODIUM: 129 meq/L — AB (ref 137–147)

## 2014-02-16 LAB — COMPREHENSIVE METABOLIC PANEL
ALT: 12 U/L (ref 0–35)
AST: 30 U/L (ref 0–37)
Albumin: 4.7 g/dL (ref 3.5–5.2)
Alkaline Phosphatase: 126 U/L — ABNORMAL HIGH (ref 39–117)
Anion gap: 19 — ABNORMAL HIGH (ref 5–15)
BUN: 4 mg/dL — ABNORMAL LOW (ref 6–23)
CALCIUM: 9.5 mg/dL (ref 8.4–10.5)
CO2: 24 meq/L (ref 19–32)
Chloride: 79 mEq/L — ABNORMAL LOW (ref 96–112)
Creatinine, Ser: 0.56 mg/dL (ref 0.50–1.10)
GFR calc Af Amer: >90 mL/min (ref >90)
Glucose, Bld: 80 mg/dL (ref 70–99)
Potassium: 4.8 mEq/L (ref 3.7–5.3)
SODIUM: 122 meq/L — AB (ref 137–147)
Total Bilirubin: 0.8 mg/dL (ref 0.3–1.2)
Total Protein: 8.1 g/dL (ref 6.0–8.3)

## 2014-02-16 LAB — CBC WITH DIFFERENTIAL/PLATELET
Basophils Absolute: 0 10*3/uL (ref 0.0–0.1)
Basophils Relative: 0 % (ref 0–1)
EOS PCT: 0 % (ref 0–5)
Eosinophils Absolute: 0 10*3/uL (ref 0.0–0.7)
HEMATOCRIT: 37.7 % (ref 36.0–46.0)
Hemoglobin: 13.8 g/dL (ref 12.0–15.0)
LYMPHS ABS: 1.7 10*3/uL (ref 0.7–4.0)
Lymphocytes Relative: 52 % — ABNORMAL HIGH (ref 12–46)
MCH: 28.4 pg (ref 26.0–34.0)
MCHC: 26.6 g/dL — ABNORMAL LOW (ref 30.0–36.0)
MCV: 77.6 fL — AB (ref 78.0–100.0)
Monocytes Absolute: 0.5 10*3/uL (ref 0.1–1.0)
Monocytes Relative: 14 % — ABNORMAL HIGH (ref 3–12)
Neutro Abs: 1.1 10*3/uL — ABNORMAL LOW (ref 1.7–7.7)
Neutrophils Relative %: 34 % — ABNORMAL LOW (ref 43–77)
Platelets: 211 10*3/uL (ref 150–400)
RBC: 4.86 MIL/uL (ref 3.87–5.11)
RDW: 13.7 % (ref 11.5–15.5)
WBC: 3.4 10*3/uL — AB (ref 4.0–10.5)

## 2014-02-16 LAB — URINALYSIS, ROUTINE W REFLEX MICROSCOPIC
Bilirubin Urine: NEGATIVE
Glucose, UA: NEGATIVE mg/dL
HGB URINE DIPSTICK: NEGATIVE
Ketones, ur: NEGATIVE mg/dL
Leukocytes, UA: NEGATIVE
NITRITE: NEGATIVE
PROTEIN: NEGATIVE mg/dL
Specific Gravity, Urine: <1.005 — ABNORMAL LOW (ref 1.005–1.030)
UROBILINOGEN UA: 0.2 mg/dL (ref 0.0–1.0)
pH: 5.5 (ref 5.0–8.0)

## 2014-02-16 LAB — RAPID URINE DRUG SCREEN, HOSP PERFORMED
AMPHETAMINES: NOT DETECTED
Barbiturates: NOT DETECTED
Benzodiazepines: NOT DETECTED
Cocaine: NOT DETECTED
Opiates: NOT DETECTED
TETRAHYDROCANNABINOL: NOT DETECTED

## 2014-02-16 LAB — ETHANOL: ALCOHOL ETHYL (B): 373 mg/dL — AB (ref 0–11)

## 2014-02-16 MED ORDER — LORAZEPAM 1 MG PO TABS: 0.0000 mg | ORAL_TABLET | Freq: Two times a day (BID) | ORAL | Status: DC

## 2014-02-16 MED ORDER — SODIUM CHLORIDE 0.9 % IV BOLUS (SEPSIS)
1000.0000 mL | Freq: Once | INTRAVENOUS | Status: AC
  Administered 2014-02-16: 1000 mL via INTRAVENOUS

## 2014-02-16 MED ORDER — TRAMADOL HCL 50 MG PO TABS: 50.0000 mg | ORAL_TABLET | Freq: Four times a day (QID) | ORAL | Status: DC | PRN

## 2014-02-16 MED ORDER — ZIPRASIDONE MESYLATE 20 MG IM SOLR: 10.0000 mg | Freq: Four times a day (QID) | INTRAMUSCULAR | Status: DC | PRN

## 2014-02-16 MED ORDER — THIAMINE HCL 100 MG/ML IJ SOLN: 100.0000 mg | Freq: Every day | INTRAMUSCULAR | Status: DC

## 2014-02-16 MED ORDER — LORAZEPAM 1 MG PO TABS: 0.0000 mg | ORAL_TABLET | Freq: Four times a day (QID) | ORAL | Status: DC

## 2014-02-16 MED ORDER — SODIUM CHLORIDE 3 % IV SOLN
INTRAVENOUS | Status: AC
  Administered 2014-02-16: 50 mL/h via INTRAVENOUS
  Filled 2014-02-16: qty 500

## 2014-02-16 MED ORDER — METFORMIN HCL 500 MG PO TABS
500.0000 mg | ORAL_TABLET | Freq: Every day | ORAL | Status: DC
  Administered 2014-02-17: 500 mg via ORAL
  Filled 2014-02-16: qty 1

## 2014-02-16 MED ORDER — VITAMIN B-1 100 MG PO TABS
100.0000 mg | ORAL_TABLET | Freq: Every day | ORAL | Status: DC
  Administered 2014-02-17 (×2): 100 mg via ORAL
  Filled 2014-02-16 (×2): qty 1

## 2014-02-16 NOTE — BH Assessment (Signed)
[facility] Assessment Progress Note  Spoke with Dr. Jeraldine Loots and took history of patient, will see pt via tele assessment at 16:15.

## 2014-02-16 NOTE — ED Provider Notes (Signed)
CSN: [id number]     Arrival date & time 02/16/14  1344 History  This chart was scribed for Gerhard Munch, MD by Karle Plumber, ED Scribe. This patient was seen in room APAH8/APAH8 and the patient's care was started at 2:24 PM.  Chief Complaint  Patient presents with  . Medical Clearance   The history is provided by the patient.   HPI Comments:  Gloria Santana is a 58 y.o. female with h/o DM, COPD, and alcoholism brought in by [facility] who presents to the Emergency Department wanting detox from alcohol. She states she has been drinking for "years and years". Her daughters reports that she almost walked in front of a bus yesterday. Daughters reports that she fell yesterday. They states she has not been eating, only drinking beer. Pt reports she feels dehydrated. She denies pain. One of her daughters filled out IVC paperwork yesteday.  Past Medical History  Diagnosis Date  . COPD (chronic obstructive pulmonary disease)   . Poor circulation   . Alcohol dependence   . Diabetes mellitus without complication   . Hypertension    Past Surgical History  Procedure Laterality Date  . Abdominal hysterectomy     Family History  Problem Relation Age of Onset  . Diabetes Mother    History  Substance Use Topics  . Smoking status: Current Every Day Smoker -- 1.00 packs/day    Types: Cigarettes  . Smokeless tobacco: Not on file  . Alcohol Use: 2.4 - 3.6 oz/week    4-6 Cans of beer per week     Comment: 4-6 40 oz beers daily.   OB History   Grav Para Term Preterm Abortions TAB SAB Ect Mult Living                 Review of Systems  Constitutional:       Per HPI, otherwise negative  HENT:       Per HPI, otherwise negative  Respiratory:       Per HPI, otherwise negative  Cardiovascular:       Per HPI, otherwise negative  Gastrointestinal: Negative for vomiting.  Endocrine:       Negative aside from HPI  Genitourinary:       Neg aside from HPI   Musculoskeletal:        Per HPI, otherwise negative  Skin: Negative.   Neurological: Negative for syncope.    Allergies  Review of patient's allergies indicates no known allergies.  Home Medications   Prior to Admission medications   Medication Sig Start Date End Date Taking? Authorizing Provider  metFORMIN (GLUCOPHAGE) 500 MG tablet Take 1 tablet (500 mg total) by mouth daily with breakfast. 12/08/13   Benny Lennert, MD  traMADol (ULTRAM) 50 MG tablet Take 1 tablet (50 mg total) by mouth every 6 (six) hours as needed. 12/08/13   Benny Lennert, MD   Triage Vitals: BP 120/89  Pulse 83  Temp(Src) 98.5 F (36.9 C) (Oral)  Resp 17  Ht  (1.753 m)  Wt 115 lb (52.164 kg)  BMI 16.97 kg/m2  SpO2 100% Physical Exam  Nursing note and vitals reviewed. Constitutional: She is oriented to person, place, and time. She appears well-developed and well-nourished. No distress.  HENT:  Head: Normocephalic and atraumatic.  Eyes: Conjunctivae and EOM are normal.  Cardiovascular: Normal rate and regular rhythm.   Pulmonary/Chest: Effort normal and breath sounds normal. No stridor. No respiratory distress.  Abdominal: She exhibits no distension.  Musculoskeletal: She exhibits no edema.  Neurological: She is alert and oriented to person, place, and time. No cranial nerve deficit.  Skin: Skin is warm and dry.  Psychiatric: She has a normal mood and affect.    ED Course  Procedures (including critical care time) DIAGNOSTIC STUDIES: Oxygen Saturation is 100% on RA, normal by my interpretation.   COORDINATION OF CARE: 2:29 PM- Will order labs, fluids. Pt's family verbalizes understanding and agrees to plan.  Medications  sodium chloride (hypertonic) 3 % solution (50 mL/hr Intravenous New Bag/Given 02/16/14 1703)  sodium chloride 0.9 % bolus 1,000 mL (0 mLs Intravenous Stopped 02/16/14 1557)    Labs Review Labs Reviewed  CBC WITH DIFFERENTIAL - Abnormal; Notable for the following:    WBC 3.4 (*)    MCV 77.6  (*)    MCHC 26.6 (*)    Neutrophils Relative % 34 (*)    Neutro Abs 1.1 (*)    Lymphocytes Relative 52 (*)    Monocytes Relative 14 (*)    All other components within normal limits  COMPREHENSIVE METABOLIC PANEL - Abnormal; Notable for the following:    Sodium 122 (*)    Chloride 79 (*)    BUN 4 (*)    Alkaline Phosphatase 126 (*)    Anion gap 19 (*)    All other components within normal limits  URINALYSIS, ROUTINE W REFLEX MICROSCOPIC - Abnormal; Notable for the following:    Specific Gravity, Urine <1.005 (*)    All other components within normal limits  ETHANOL - Abnormal; Notable for the following:    Alcohol, Ethyl (B) 373 (*)    All other components within normal limits  BASIC METABOLIC PANEL - Abnormal; Notable for the following:    Sodium 129 (*)    Chloride 87 (*)    Glucose, Bld 130 (*)    BUN 4 (*)    Anion gap 18 (*)    All other components within normal limits  URINE RAPID DRUG SCREEN (HOSP PERFORMED)   I discussed patient's case with his family members. Family members have taken an IVC papers an outpatient. He states that they're very concerned about her increased alcohol intake, and states that over the past day she had one episode with severe intoxication, and stumbled close to the traffic.  Initial labs notable for hyponatremia, even beyond the patient's baseline. With some concern for the patient's listlessness, patient will receive hyper tonic saline.  Patient has already received IV fluid bolus.  Update: I discussed patient's case with our behavioral health team. Specifically discussed indication for involuntary commitment. The patient has some inebriation, she denies suicidal intent homicidal intent, psychiatric features. She does not meet criteria for involuntary commitment. Regardless, the patient does endorse desire for assistance with alcohol abuse   Update: Patient ambulatory  9:01 PM Patient resting  Following resuscitation with hypertonic  saline, the patient now has sodium of 129, which is normal for her.   MDM  Patient presents due to alcohol abuse, with request for assistance. Patient is listless initially, and initial labs are notable for critically abnormal sodium value. Patient received fluid resuscitation, and hypertonic saline. Following 3 hours of resuscitation with hypertonic saline, the patient's sodium level returned to her baseline. Patient remained hemodynamically stable following this intervention, and was medically cleared, pending further sobering up for assistance with placement for alcohol abuse  I personally performed the services described in this documentation, which was scribed in my presence. The recorded information has been  reviewed and is accurate.   CRITICAL CARE Performed by: Gerhard Munch Total critical care time: 40 Critical care time was exclusive of separately billable procedures and treating other patients. Critical care was necessary to treat or prevent imminent or life-threatening deterioration. Critical care was time spent personally by me on the following activities: development of treatment plan with patient and/or surrogate as well as nursing, discussions with consultants, evaluation of patient's response to treatment, examination of patient, obtaining history from patient or surrogate, ordering and performing treatments and interventions, ordering and review of laboratory studies, ordering and review of radiographic studies, pulse oximetry and re-evaluation of patient's condition.     Gerhard Munch, MD 02/16/14 2129

## 2014-02-16 NOTE — ED Notes (Signed)
Daughter took out IVC papers last night.  Has called [employer]rif[employer] dept[employer] to get papers sent.

## 2014-02-16 NOTE — ED Notes (Addendum)
Patient presents for help w/alcohol treatment.  Has been alcoholic since prior to 1998.  Daughter states drinking has been heavier over last month to point she is about to lose her job. Patient has had n/v recently. Has not been eating, only drinking beer.

## 2014-02-16 NOTE — ED Notes (Signed)
Pt's daughters left their phone numbers.  Roseanne Reno- [phone] Sharee Holster- [phone]
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
## 2014-02-17 LAB — BASIC METABOLIC PANEL
ANION GAP: 12 (ref 5–15)
BUN: 5 mg/dL — ABNORMAL LOW (ref 6–23)
CO2: 27 mEq/L (ref 19–32)
Calcium: 9.2 mg/dL (ref 8.4–10.5)
Chloride: 94 mEq/L — ABNORMAL LOW (ref 96–112)
Creatinine, Ser: 0.63 mg/dL (ref 0.50–1.10)
GFR calc Af Amer: >90 mL/min (ref >90)
Glucose, Bld: 91 mg/dL (ref 70–99)
POTASSIUM: 4.9 meq/L (ref 3.7–5.3)
Sodium: 133 mEq/L — ABNORMAL LOW (ref 137–147)

## 2014-02-17 NOTE — Discharge Instructions (Signed)
We have offered you placement but you have refused - you may return if you change your mind - see the list of family doctors below as well as substance abuse resources.   Emergency Department Resource Guide 1) Find a Doctor and Pay Out of Pocket Although you won't have to find out who is covered by your insurance plan, it is a good idea to ask around and get recommendations. You will then need to call the office and see if the doctor you have chosen will accept you as a new patient and what types of options they offer for patients who are self-pay. Some doctors offer discounts or will set up payment plans for their patients who do not have insurance, but you will need to ask so you aren't surprised when you get to your appointment.  2) Contact Your Local Health Department Not all health departments have doctors that can see patients for sick visits, but many do, so it is worth a call to see if yours does. If you don't know where your local health department is, you can check in your phone book. The CDC also has a tool to help you locate your state's health department, and many state websites also have listings of all of their local health departments.  3) Find a Walk-in Clinic If your illness is not likely to be very severe or complicated, you may want to try a walk in clinic. These are popping up all over the country in pharmacies, drugstores, and shopping centers. They're usually staffed by nurse practitioners or physician assistants that have been trained to treat common illnesses and complaints. They're usually fairly quick and inexpensive. However, if you have serious medical issues or chronic medical problems, these are probably not your best option.  No Primary Care Doctor: - Call Health Connect at  [phone] - they can help you locate a primary care doctor that  accepts your insurance, provides certain services, etc. - Physician Referral Service- [phone]  Chronic Pain  Problems: Organization         Address  Phone   Notes  [facility]  [phone] Patients need to be referred by their primary care doctor.   Medication Assistance: Organization         Address  Phone   Notes  [facility] Medication [facility] [address] [place] [place], [place] [address] [phone] --Must be a resident of [facility] -- Must have NO insurance coverage whatsoever (no Medicaid/ Medicare, etc.) -- The pt. MUST have a primary care doctor that directs their care regularly and follows them in the community   [facility]  [phone]   [employer]  [phone]    Agencies that provide inexpensive medical care: Organization         Address  Phone   Notes  [facility]  [phone]   [facility]    [phone]   [facility] [address] [place], [place] [address] [phone]   [facility] [address] [place], [place] [phone]   Planned Parenthood    [phone]   [facility]    [phone]   [facility] [place] [facility]  [address], [place] Phone:  [phone], Fax:  [phone] Hours of Operation:  9 am - 6 pm, M-F.  Also accepts Medicaid/Medicare and self-pay.  [facility]  [address], [place] Phone: [phone], Fax: [phone]. Hours of Operation:  8:30 am - 5:30 pm, M-F.  Also accepts Medicaid and self-pay.  °[facility] [place] [address], [place] Phone: [phone]   °[facility] [address], [place], [place] [phone], Ext. 123 Mondays & Thursdays: 7-9 AM.  First 15 patients are seen on a first come, first serve basis. °  ° °Medicaid-accepting [place] Providers: ° °Organization         Address  Phone   Notes  °[facility] [address], [place] [phone] Also  accepts self-pay patients.  °[facility] [address], [place] ° [phone]   °[facility] [address], [place] [phone]   °[facility] [address] [place] [address], [place] [phone]   °Veita Bland [address], [place]  ° [phone] Only accepts [place] Access Medicaid patients after they have their name applied to their card.  ° °Self-Pay (no insurance) in [place]: ° °Organization         Address  Phone   Notes  °Sickle Cell Patients, [facility] [address], [place] [phone]   °[facility] [address], [place] [phone]   °[facility] ° [address], [place] [phone]) [phone]   °[facility]/Dr. Osei-Bonsu ° [address] [place] [address], [place] or [address], [place] [phone] Phone number for both [place] and [place] locations is the same.  °[facility] [address], [place] [phone]) [phone]   °[facility] [address] [place] [address], [place] or [address] [phone]) [phone] °([phone]) [phone]   °[facility] [address], [place] [phone]) [phone], phone; [phone], fax Sees patients 1st and 3rd Saturday of every month.  Must not qualify for public or private insurance (i.e. Medicaid, Medicare, [place] Health Choice, Veterans' Benefits) • Household income should be no more than 200% of the poverty level •The clinic cannot treat you if you are pregnant or think you are pregnant • Sexually transmitted diseases are not treated at the clinic.  ° ° °Dental Care: °Organization         Address  Phone  Notes  °[place] [facility] [address], [place] [phone]) [phone] Accepts children up to age 21 who are enrolled in Medicaid or [place] Health Choice; pregnant  women with a Medicaid card; and children who have applied for Medicaid or [place] Health Choice, but were declined, whose parents can pay a reduced fee at time of service.  °[place] [facility] [place]  [address], [place] [phone]) [phone] Accepts children up to age 21 who are enrolled in Medicaid or [place] Health Choice; pregnant women with a Medicaid card; and children who have applied for Medicaid or  Health Choice, but were declined, whose parents can pay a reduced fee at time of service.  °[facility] ° [address],  [phone]) [phone] Patients are seen by appointment only. Walk-ins are not accepted. [facility] will see patients 18 years of age and older. °Monday - Tuesday (8am-5pm) °Most Wednesdays (8:30-5pm) °$30 per visit, cash only  °[facility]  [address], [facility] [phone] Patients are seen by appointment only. Walk-ins are not accepted. [place] will see patients 60 years of age and older. One Wednesday Evening (Monthly: Volunteer Based).  $30 per visit, cash only  [place]  [phone] for adults; Children under age 4, call Graduate Pediatric Dentistry at [phone]. Children aged 49-14, please call [phone] to request a pediatric application.  Dental services are provided in all areas of dental care including fillings, crowns and bridges, complete and partial dentures, implants, gum treatment, root canals, and extractions. Preventive care is also provided. Treatment is provided to both adults and children. Patients are selected via a lottery and there is often a waiting list.   [facility] [address] [place]  [phone] [email or web address]   [facility] [address] [place], [place] [phone], Ext. [phone] Second and Fourth Thursday of each month, opens at 6:30 AM; Clinic ends at 9 AM.  Patients are  seen on a first-come first-served basis, and a limited number are seen during each clinic.   [facility]  [address] Hillard Danker [place], [place] [phone]   Eligibility Requirements You must have lived in [place], [place], or [place] counties for at least the last three months.   You cannot be eligible for state or federal sponsored [employer], including [employer], [place], or [employer].   You generally cannot be eligible for healthcare insurance through your employer.    How to apply: Eligibility screenings are held every Tuesday and Wednesday afternoon from 1:00 pm until 4:00 pm. You do not need an appointment for the interview!  [facility] [address] [place], [place]   [name] Department  [place]  [phone]    Behavioral Health Resources in the Community: Intensive Outpatient Programs Organization         Address  Phone  Notes  [facility]. [address], [place], [place] [phone]   [facility] Outpatient [address], [place], [place]   [facility]: [facility] [address] [place] [address], [place], [place]   [place] [address],  [place], [place] or [phone]   Substance Abuse Resources Organization         Address  Phone  Notes  [facility] Drug Services  [phone]   [place]  [phone]   [facility] [place]   Chinita Pester  [phone]   Residential & Outpatient Substance Abuse Program  [phone]   Psychological Services Organization         Address  Phone  Notes  [facility]  [place]  [phone]   [place] [address] [place] [phone] or 7327526419    Mobile Crisis  Teams Organization         Address  Phone  Notes  [facility], Mobile Crisis Care Unit  [phone]   [facility]  [address] [place], [place]   [facility] [address] [place] [phone]    Self-Help/Support Groups Organization         Address  Phone             Notes  [facility] [place] - variety of support groups  336- I7437963 Call for more information  Narcotics Anonymous (NA), [facility] [address], [employer] [place]  2 meetings at this location   Statistician         Address  Phone  Notes  [facility] [address],    [place]  [phone]   [facility]  [address] [place] [address], [place], [place] [phone]   [facility] [address] [place], [place] [phone] Admissions: 8am-3pm M-F  [facility] [address],    [place], [place] [phone]   [facility] [address] [place], [place], [place] [phone]   [facility] [address]  [place], [place] [phone]   [facility] - Intensive Outpatient [address], [place], [place] [phone]   [facility] [address] [place]  [place], [place] [phone] or [phone]   [facility] [address], [place], [place] [phone] Accepts Medicaid  [facility] [address]  [place] [phone] Substance Abuse/Addiction Treatment   [facility] Organization         Address  Phone  Notes  [facility]  5734203541   Angie Fava, PhD [address] Ervin Knack [place], [place]   [phone] or [phone]   [facility]al   [address] [place] [address] [place], [place] [phone]   [facility] [address], [place], [place] [phone]  Insurance/Medicaid/sponsorship through [facility] Families [address]                                    [place], [place] [phone] Therapy/tele-psych/case  [facility] [address], [place] [phone]    Dr. Lolly Mustache  [phone]   [facility]  [employer] [facility] Dept. 1) [address], [place] 2) [address] [place] 3)  [address], [place] [phone]  [phone]   [facility] Child Abuse Hotline 801-393-0808 or [phone])646-3920 (After Hours)         [facility] Doctor List    Kari Baars MD. Specialty: Pulmonary Disease Contact information: [address]  [place] [address]  [phone]   Syliva Overman, MD. Specialty: [facility] Contact information: [address]  [place] [address]  [phone]   Lilyan Punt, MD. Specialty: Family Medicine Contact information: [address]  [place] [address]  [phone]   Avon Gully, MD Specialty: Internal Medicine Contact information: [address] [place] [address]  [phone]   Catalina Pizza, MD. Specialty: Internal Medicine Contact information: [address]  [place] [address]  [phone]   Butch Penny, MD. Specialty: Family Medicine Contact information: [address]  [place] [address]  [phone]   John Giovanni, MD. Specialty: [facility] Contact information: [address]  [place] [address]  [phone]   Carylon Perches, MD. Specialty: Internal Medicine Contact information: [address]  [place] [address]  [phone]

## 2014-02-17 NOTE — ED Notes (Signed)
Gloria Santana from [facility] re-assessing pt for placement.  Pt is requesting to be discharged home.

## 2014-02-17 NOTE — BH Assessment (Signed)
[facility] Assessment Progress Note   Called [facility] to follow up with pt referral and per [facility] @ [phone], pt declined there due to lack of acuity.  Pt is pending several other facilities and [facility] will continue to seek placement for the pt.  Casimer Lanius, MS, [facility] Licensed Professional Counselor Triage Specialist

## 2014-02-17 NOTE — BH Assessment (Signed)
[facility] Assessment Progress Note   Pt seen this day for reassessment by this clinician.  Pt stating she wants to go home, that she can detox on her own.  She denies any current withdrawal sx at this time.  Pt is under IVC by her daughter due to stating pt almost walked out in front of a bus.  Pt denies SI/HI or psychosis.  Pt stated she ate and slept well last night, but that she wants to go home, get a change of clothes and a shower.  Pt stated she was going to talk to EDP about going home.  Pt is pleasant, has euthymic mood, is oriented x 4, has soft, logical/coherent thought processes and normal speech.  Pt is pending several facilities for detox.  Casimer Lanius, MS, [facility] Licensed Professional Counselor Triage Specialist

## 2014-02-17 NOTE — BH Assessment (Addendum)
Per Joann Glover, AC at [facility], adult unit is currently at capacity. Contacted the following facilities for placement:  BED AVAILABLE, FAXED CLINICAL INFORMATION: [facility], per [facility], per Kathy [facility], per Kimberly [facility], per Kevin [facility], per Jackie  AT CAPACITY: [facility], per Elva [facility], per Jessica [facility], per Robyn [facility], per Lisa [facility], per Joan [facility], per Heather [facility], per Virginia [facility], per Vonda [facility], per Jennifer [facility], per Jerry [facility], per Susan  NO RESPONSE: [facility]  PT DECLINED: [employer]   Gloria Santana, LPC, [facility] Triage Specialist [phone]  

## 2014-02-17 NOTE — BH Assessment (Signed)
[facility] Assessment Progress Note  Called [facility] and scheduled pt's reassessment with this clinician for 0910.  Casimer Lanius, MS, [employer] Licensed Professional Counselor Triage Specialist

## 2014-02-17 NOTE — ED Provider Notes (Signed)
Pt requests d/c - she is ambulatory without difficulty - has normal MS, no tremor, no seizures, no DT's, IVC overturned yesterday.  No intentional plan or thoughts of  danger to self or others  Vida Roller, MD 02/17/14 [phone]

## 2014-02-17 NOTE — ED Notes (Signed)
Pt is requesting to see MD.  Dr Hyacinth Meeker informed of request.  Dr Hyacinth Meeker to see pt.

## 2014-02-17 NOTE — BH Assessment (Signed)
Gloria Santana at [facility] said Pt is being considered but [employer] must approve detox treatment. They will call back when they receive authorization.  Harlin Rain Ria Comment, [facility] Triage Specialist [phone]

## 2014-02-17 NOTE — ED Notes (Signed)
Reassessment by [facility] Wynona Canes) in progress at this time.

## 2014-05-05 ENCOUNTER — Emergency Department (HOSPITAL_COMMUNITY): Payer: BC Managed Care – PPO

## 2014-05-05 ENCOUNTER — Encounter (HOSPITAL_COMMUNITY): Payer: Self-pay | Admitting: *Deleted

## 2014-05-05 ENCOUNTER — Emergency Department (HOSPITAL_COMMUNITY)
Admission: EM | Admit: 2014-05-05 | Discharge: 2014-05-05 | Disposition: A | Discharge status: Home / Self Care | Payer: BC Managed Care – PPO | Attending: Emergency Medicine | Admitting: Emergency Medicine

## 2014-05-05 DIAGNOSIS — E119 Type 2 diabetes mellitus without complications: Secondary | ICD-10-CM | POA: Diagnosis not present

## 2014-05-05 DIAGNOSIS — Z79899 Other long term (current) drug therapy: Secondary | ICD-10-CM | POA: Insufficient documentation

## 2014-05-05 DIAGNOSIS — M549 Dorsalgia, unspecified: Secondary | ICD-10-CM

## 2014-05-05 DIAGNOSIS — M545 Low back pain, unspecified: Secondary | ICD-10-CM

## 2014-05-05 DIAGNOSIS — I10 Essential (primary) hypertension: Secondary | ICD-10-CM | POA: Diagnosis not present

## 2014-05-05 DIAGNOSIS — Z720 Tobacco use: Secondary | ICD-10-CM | POA: Insufficient documentation

## 2014-05-05 DIAGNOSIS — J449 Chronic obstructive pulmonary disease, unspecified: Secondary | ICD-10-CM | POA: Insufficient documentation

## 2014-05-05 LAB — URINE MICROSCOPIC-ADD ON

## 2014-05-05 LAB — URINALYSIS, ROUTINE W REFLEX MICROSCOPIC
Bilirubin Urine: NEGATIVE
GLUCOSE, UA: NEGATIVE mg/dL
Hgb urine dipstick: NEGATIVE
Ketones, ur: NEGATIVE mg/dL
NITRITE: NEGATIVE
PROTEIN: NEGATIVE mg/dL
Specific Gravity, Urine: 1.015 (ref 1.005–1.030)
Urobilinogen, UA: 0.2 mg/dL (ref 0.0–1.0)
pH: 6 (ref 5.0–8.0)

## 2014-05-05 MED ORDER — OXYCODONE-ACETAMINOPHEN 5-325 MG PO TABS: 1.0000 | ORAL_TABLET | ORAL | Status: DC | PRN

## 2014-05-05 MED ORDER — OXYCODONE-ACETAMINOPHEN 5-325 MG PO TABS
1.0000 | ORAL_TABLET | Freq: Once | ORAL | Status: AC
  Administered 2014-05-05: 1 via ORAL
  Filled 2014-05-05: qty 1

## 2014-05-05 NOTE — Discharge Instructions (Signed)
SEEK IMMEDIATE MEDICAL ATTENTION IF: New numbness, tingling, weakness, or problem with the use of your arms or legs.  Severe back pain not relieved with medications.  Change in bowel or bladder control (if you lose control of stool or urine, or if you are unable to urinate) Increasing pain in any areas of the body (such as chest or abdominal pain).  Shortness of breath, dizziness or fainting.  Nausea (feeling sick to your stomach), vomiting, fever, or sweats.   Back Exercises Back exercises help treat and prevent back injuries. The goal of back exercises is to increase the strength of your abdominal and back muscles and the flexibility of your back. These exercises should be started when you no longer have back pain. Back exercises include:  Pelvic Tilt. Lie on your back with your knees bent. Tilt your pelvis until the lower part of your back is against the floor. Hold this position 5 to 10 sec and repeat 5 to 10 times.  Knee to Chest. Pull first 1 knee up against your chest and hold for 20 to 30 seconds, repeat this with the other knee, and then both knees. This may be done with the other leg straight or bent, whichever feels better.  Sit-Ups or Curl-Ups. Bend your knees 90 degrees. Start with tilting your pelvis, and do a partial, slow sit-up, lifting your trunk only 30 to 45 degrees off the floor. Take at least 2 to 3 seconds for each sit-up. Do not do sit-ups with your knees out straight. If partial sit-ups are difficult, simply do the above but with only tightening your abdominal muscles and holding it as directed.  Hip-Lift. Lie on your back with your knees flexed 90 degrees. Push down with your feet and shoulders as you raise your hips a couple inches off the floor; hold for 10 seconds, repeat 5 to 10 times.  Back arches. Lie on your stomach, propping yourself up on bent elbows. Slowly press on your hands, causing an arch in your low back. Repeat 3 to 5 times. Any initial stiffness and  discomfort should lessen with repetition over time.  Shoulder-Lifts. Lie face down with arms beside your body. Keep hips and torso pressed to floor as you slowly lift your head and shoulders off the floor. Do not overdo your exercises, especially in the beginning. Exercises may cause you some mild back discomfort which lasts for a few minutes; however, if the pain is more severe, or lasts for more than 15 minutes, do not continue exercises until you see your caregiver. Improvement with exercise therapy for back problems is slow.  See your caregivers for assistance with developing a proper back exercise program. Document Released: 07/09/2004 Document Revised: 08/24/2011 Document Reviewed: 04/02/2011 [facility] Patient Information 2015 [employer], [place]. This information is not intended to replace advice given to you by your health care provider. Make sure you discuss any questions you have with your health care provider.

## 2014-05-05 NOTE — ED Notes (Signed)
Pt reporting that for approximately the past 4 weeks she has had pain in lower back that radiates into buttocks and into leg.  Denies relief with home meds.

## 2014-05-05 NOTE — ED Provider Notes (Signed)
CSN: [id number]     Arrival date & time 05/05/14  0145 History   First MD Initiated Contact with Patient 05/05/14 0206     Chief Complaint  Patient presents with  . Back Pain      Patient is a 58 y.o. female presenting with back pain. The history is provided by the patient.  Back Pain Location:  Lumbar spine Quality:  Aching Radiates to: buttocks. Pain severity:  Moderate Onset quality:  Gradual Duration:  3 weeks Timing:  Constant Progression:  Worsening Chronicity:  New Relieved by:  Nothing Worsened by:  Movement Associated symptoms: no abdominal pain, no bladder incontinence, no bowel incontinence, no chest pain, no dysuria, no fever and no weakness   Risk factors: no recent surgery   denies trauma No h/o back surgery   Past Medical History  Diagnosis Date  . COPD (chronic obstructive pulmonary disease)   . Poor circulation   . Alcohol dependence   . Diabetes mellitus without complication   . Hypertension    Past Surgical History  Procedure Laterality Date  . Abdominal hysterectomy     Family History  Problem Relation Age of Onset  . Diabetes Mother    History  Substance Use Topics  . Smoking status: Current Every Day Smoker -- 1.00 packs/day    Types: Cigarettes  . Smokeless tobacco: Not on file  . Alcohol Use: 2.4 - 3.6 oz/week    4-6 Cans of beer per week     Comment: 4-6 40 oz beers daily.   OB History    No data available     Review of Systems  Constitutional: Negative for fever.  Cardiovascular: Negative for chest pain.  Gastrointestinal: Negative for vomiting, abdominal pain and bowel incontinence.  Genitourinary: Negative for bladder incontinence and dysuria.  Musculoskeletal: Positive for back pain.  Neurological: Negative for weakness.  All other systems reviewed and are negative.     Allergies  Review of patient's allergies indicates no known allergies.  Home Medications   Prior to Admission medications   Medication Sig Start  Date End Date Taking? Authorizing Provider  metFORMIN (GLUCOPHAGE) 500 MG tablet Take 1 tablet (500 mg total) by mouth daily with breakfast. 12/08/13   Benny LennertJoseph L Zammit, MD  traMADol (ULTRAM) 50 MG tablet Take 1 tablet (50 mg total) by mouth every 6 (six) hours as needed. 12/08/13   Benny LennertJoseph L Zammit, MD   BP 138/86 mmHg  Pulse 83  Temp(Src) 98.7 F (37.1 C) (Oral)  Resp 18  Ht 5\' 7"  (1.702 m)  Wt 115 lb (52.164 kg)  BMI 18.01 kg/m2  SpO2 100% Physical Exam CONSTITUTIONAL: Well developed/well nourished HEAD: Normocephalic/atraumatic EYES: EOMI/PERRL ENMT: Mucous membranes moist NECK: supple no meningeal signs SPINE/BACK:entire spine nontender, lumbar paraspinal tenderness.   CV: S1/S2 noted, no murmurs/rubs/gallops noted LUNGS: Lungs are clear to auscultation bilaterally, no apparent distress ABDOMEN: soft, nontender, no rebound or guarding GU:no cva tenderness NEURO: Awake/alert, equal motor 5/5 strength noted with the following: hip flexion/knee flexion/extension, foot dorsi/plantar flexion, great toe extension intact bilaterally, no clonus bilaterally, plantar reflex appropriate (toes downgoing), no sensory deficit in any dermatome.  Equal patellar/achilles reflex noted (2+) in bilateral lower extremities.  Pt is able to ambulate unassisted. EXTREMITIES: pulses normal, full ROM SKIN: warm, color normal PSYCH: no abnormalities of mood noted, alert and oriented to situation   ED Course  Procedures Labs Review Labs Reviewed  URINALYSIS, ROUTINE W REFLEX MICROSCOPIC - Abnormal; Notable for the following:  Leukocytes, UA SMALL (*)    All other components within normal limits  URINE MICROSCOPIC-ADD ON - Abnormal; Notable for the following:    Squamous Epithelial / LPF MANY (*)    Bacteria, UA FEW (*)    All other components within normal limits    Imaging Review Dg Lumbar Spine Complete  05/05/2014   CLINICAL DATA:  Bilateral low back pain radiating into the buttocks and both  legs for 4 weeks. No injury.  EXAM: LUMBAR SPINE - COMPLETE 4+ VIEW  COMPARISON:  None.  FINDINGS: Degenerative changes at the lumbosacral interspace. Normal alignment of the lumbar spine. No vertebral compression deformities. No focal bone lesions.  IMPRESSION: Degenerative changes.  No displaced fractures identified.   Electronically Signed   By: Burman NievesWilliam  Stevens M.D.   On: 05/05/2014 02:40   Medications  oxyCODONE-acetaminophen (PERCOCET/ROXICET) 5-325 MG per tablet 1 tablet (1 tablet Oral Given 05/05/14 0342)     MDM  3:47 AM Urinalysis/lumbar xray negative Pt ambulatory Will need outpatient evaluation.  At this point there is no evidence of acute neurologic emergency   Final diagnoses:  Back pain  Acute low back pain    Nursing notes including past medical history and social history reviewed and considered in documentation Labs/vital reviewed myself and considered during evaluation xrays/imaging reviewed by myself and considered during evaluation     Joya Gaskinsonald W Nilton Lave, MD 05/05/14 [phone]

## 2014-11-01 ENCOUNTER — Encounter (HOSPITAL_COMMUNITY): Payer: Self-pay | Admitting: *Deleted

## 2014-11-01 ENCOUNTER — Emergency Department (HOSPITAL_COMMUNITY)
Admission: EM | Admit: 2014-11-01 | Discharge: 2014-11-01 | Disposition: A | Discharge status: Home / Self Care | Payer: Self-pay | Attending: Emergency Medicine | Admitting: Emergency Medicine

## 2014-11-01 DIAGNOSIS — Z79899 Other long term (current) drug therapy: Secondary | ICD-10-CM | POA: Insufficient documentation

## 2014-11-01 DIAGNOSIS — I10 Essential (primary) hypertension: Secondary | ICD-10-CM | POA: Insufficient documentation

## 2014-11-01 DIAGNOSIS — Z791 Long term (current) use of non-steroidal anti-inflammatories (NSAID): Secondary | ICD-10-CM | POA: Insufficient documentation

## 2014-11-01 DIAGNOSIS — E119 Type 2 diabetes mellitus without complications: Secondary | ICD-10-CM | POA: Insufficient documentation

## 2014-11-01 DIAGNOSIS — J449 Chronic obstructive pulmonary disease, unspecified: Secondary | ICD-10-CM | POA: Insufficient documentation

## 2014-11-01 DIAGNOSIS — A5901 Trichomonal vulvovaginitis: Secondary | ICD-10-CM | POA: Insufficient documentation

## 2014-11-01 DIAGNOSIS — A64 Unspecified sexually transmitted disease: Secondary | ICD-10-CM

## 2014-11-01 DIAGNOSIS — Z720 Tobacco use: Secondary | ICD-10-CM | POA: Insufficient documentation

## 2014-11-01 LAB — URINE MICROSCOPIC-ADD ON

## 2014-11-01 LAB — WET PREP, GENITAL
CLUE CELLS WET PREP: NONE SEEN
Yeast Wet Prep HPF POC: NONE SEEN

## 2014-11-01 LAB — URINALYSIS, ROUTINE W REFLEX MICROSCOPIC
Bilirubin Urine: NEGATIVE
Glucose, UA: NEGATIVE mg/dL
Ketones, ur: NEGATIVE mg/dL
NITRITE: NEGATIVE
Protein, ur: NEGATIVE mg/dL
SPECIFIC GRAVITY, URINE: 1.01 (ref 1.005–1.030)
Urobilinogen, UA: 0.2 mg/dL (ref 0.0–1.0)
pH: 5.5 (ref 5.0–8.0)

## 2014-11-01 MED ORDER — LIDOCAINE HCL (PF) 1 % IJ SOLN
INTRAMUSCULAR | Status: AC
  Filled 2014-11-01: qty 5

## 2014-11-01 MED ORDER — CIPROFLOXACIN HCL 500 MG PO TABS: 500.0000 mg | ORAL_TABLET | Freq: Two times a day (BID) | ORAL | Status: DC

## 2014-11-01 MED ORDER — AZITHROMYCIN 250 MG PO TABS
1000.0000 mg | ORAL_TABLET | Freq: Once | ORAL | Status: AC
Start: 2014-11-01 — End: 2014-11-01
  Administered 2014-11-01: 1000 mg via ORAL
  Filled 2014-11-01: qty 4

## 2014-11-01 MED ORDER — CEFTRIAXONE SODIUM 250 MG IJ SOLR
250.0000 mg | Freq: Once | INTRAMUSCULAR | Status: AC
  Administered 2014-11-01: 250 mg via INTRAMUSCULAR
  Filled 2014-11-01: qty 250

## 2014-11-01 MED ORDER — METRONIDAZOLE 500 MG PO TABS: 500.0000 mg | ORAL_TABLET | Freq: Two times a day (BID) | ORAL | Status: DC

## 2014-11-01 NOTE — ED Provider Notes (Signed)
CSN: [id number]     Arrival date & time 11/01/14  1619 History   First MD Initiated Contact with Patient 11/01/14 1928     Chief Complaint  Patient presents with  . Vaginal Discharge     (Consider location/radiation/quality/duration/timing/severity/associated sxs/prior Treatment) HPI Comments: The patient is a 59 year old female, she has a history of COPD, alcohol dependence, diabetes, hypertension and an abdominal hysterectomy in the 1980s secondary to heavy vaginal bleeding. She states that one week ago she developed gradual onset of persistent yellow discharge from her vagina, this does not cause any dysuria, abdominal pain, does not smell bad, it is not associated with diarrhea, rectal bleeding or constipation. She does endorse being sexually active, she thinks that she may be at risk for sexual transmitted disease. There has been no fevers chills nausea or vomiting.  Patient is a 59 y.o. female presenting with vaginal discharge. The history is provided by the patient.  Vaginal Discharge   Past Medical History  Diagnosis Date  . COPD (chronic obstructive pulmonary disease)   . Poor circulation   . Alcohol dependence   . Diabetes mellitus without complication   . Hypertension    Past Surgical History  Procedure Laterality Date  . Abdominal hysterectomy     Family History  Problem Relation Age of Onset  . Diabetes Mother    History  Substance Use Topics  . Smoking status: Current Every Day Smoker -- 1.00 packs/day    Types: Cigarettes  . Smokeless tobacco: Not on file  . Alcohol Use: 2.4 - 3.6 oz/week    4-6 Cans of beer per week     Comment: 4-6 40 oz beers daily.   OB History    No data available     Review of Systems  Genitourinary: Positive for vaginal discharge.  All other systems reviewed and are negative.     Allergies  Review of patient's allergies indicates no known allergies.  Home Medications   Prior to Admission medications   Medication Sig  Start Date End Date Taking? Authorizing Provider  FLUoxetine (PROZAC) 20 MG capsule Take 20 mg by mouth daily.   Yes Historical Provider, MD  meloxicam (MOBIC) 15 MG tablet Take 15 mg by mouth daily.   Yes Historical Provider, MD  ciprofloxacin (CIPRO) 500 MG tablet Take 1 tablet (500 mg total) by mouth 2 (two) times daily. 11/01/14   Eber HongBrian Ivalee Strauser, MD  metFORMIN (GLUCOPHAGE) 500 MG tablet Take 1 tablet (500 mg total) by mouth daily with breakfast. Patient not taking: Reported on 11/01/2014 12/08/13   Bethann BerkshireJoseph Zammit, MD  metroNIDAZOLE (FLAGYL) 500 MG tablet Take 1 tablet (500 mg total) by mouth 2 (two) times daily. 11/01/14   Eber HongBrian Nalany Steedley, MD  oxyCODONE-acetaminophen (PERCOCET/ROXICET) 5-325 MG per tablet Take 1 tablet by mouth every 4 (four) hours as needed for severe pain. Patient not taking: Reported on 11/01/2014 05/05/14   Zadie Rhineonald Wickline, MD   BP 131/87 mmHg  Pulse 93  Temp(Src) 97.8 F (36.6 C) (Oral)  Resp 16  Ht 5\' 7"  (1.702 m)  Wt 110 lb (49.896 kg)  BMI 17.22 kg/m2  SpO2 98% Physical Exam  Constitutional: She appears well-developed and well-nourished. No distress.  HENT:  Head: Normocephalic and atraumatic.  Mouth/Throat: Oropharynx is clear and moist. No oropharyngeal exudate.  Eyes: Conjunctivae and EOM are normal. Pupils are equal, round, and reactive to light. Right eye exhibits no discharge. Left eye exhibits no discharge. No scleral icterus.  Neck: Normal range of motion. Neck supple.  No JVD present. No thyromegaly present.  Cardiovascular: Normal rate, regular rhythm, normal heart sounds and intact distal pulses.  Exam reveals no gallop and no friction rub.   No murmur heard. Pulmonary/Chest: Effort normal and breath sounds normal. No respiratory distress. She has no wheezes. She has no rales.  Abdominal: Soft. Bowel sounds are normal. She exhibits no distension and no mass. There is no tenderness.  Genitourinary:  Chaperone present for exam, normal-appearing external  genitalia, moderate amount of yellow vaginal discharge present, no cervix present. No bleeding, no foreign body  Musculoskeletal: Normal range of motion. She exhibits no edema or tenderness.  Lymphadenopathy:    She has no cervical adenopathy.  Neurological: She is alert. Coordination normal.  Skin: Skin is warm and dry. No rash noted. No erythema.  Psychiatric: She has a normal mood and affect. Her behavior is normal.  Nursing note and vitals reviewed.   ED Course  Procedures (including critical care time) Labs Review Labs Reviewed  WET PREP, GENITAL - Abnormal; Notable for the following:    Trich, Wet Prep MODERATE (*)    WBC, Wet Prep HPF POC TOO NUMEROUS TO COUNT (*)    All other components within normal limits  URINALYSIS, ROUTINE W REFLEX MICROSCOPIC - Abnormal; Notable for the following:    Color, Urine STRAW (*)    Hgb urine dipstick TRACE (*)    Leukocytes, UA LARGE (*)    All other components within normal limits  URINE MICROSCOPIC-ADD ON - Abnormal; Notable for the following:    Squamous Epithelial / LPF FEW (*)    Bacteria, UA MANY (*)    All other components within normal limits  RPR  HIV ANTIBODY (ROUTINE TESTING)  GC/CHLAMYDIA PROBE AMP ([place])    Imaging Review No results found.    MDM   Final diagnoses:  STD (female)  Trichomonal vaginitis    STD present on testing - also has UTI - will d/c on abx.  VS normal  Stable for d/c.  Meds given in ED:  Medications  cefTRIAXone (ROCEPHIN) injection 250 mg (not administered)  azithromycin (ZITHROMAX) tablet 1,000 mg (not administered)    New Prescriptions   CIPROFLOXACIN (CIPRO) 500 MG TABLET    Take 1 tablet (500 mg total) by mouth 2 (two) times daily.   METRONIDAZOLE (FLAGYL) 500 MG TABLET    Take 1 tablet (500 mg total) by mouth 2 (two) times daily.        Eber HongBrian Lenna Hagarty, MD 11/01/14 [phone]

## 2014-11-01 NOTE — ED Notes (Signed)
Vaginal d/c for 1 week, last week had bleeding

## 2014-11-01 NOTE — Discharge Instructions (Signed)
Sexually Transmitted Disease A sexually transmitted disease (STD) is a disease or infection often passed to another person during sex. However, STDs can be passed through nonsexual ways. An STD can be passed through:  Spit (saliva).  Semen.  Blood.  Mucus from the vagina.  Pee (urine). HOW CAN I LESSEN MY CHANCES OF GETTING AN STD?  Use:  Latex condoms.  Water-soluble lubricants with condoms. Do not use petroleum jelly or oils.  Dental dams. These are small pieces of latex that are used as a barrier during oral sex.  Avoid having more than one sex partner.  Do not have sex with someone who has other sex partners.  Do not have sex with anyone you do not know or who is at high risk for an STD.  Avoid risky sex that can break your skin.  Do not have sex if you have open sores on your mouth or skin.  Avoid drinking too much alcohol or taking illegal drugs. Alcohol and drugs can affect your good judgment.  Avoid oral and anal sex acts.  Get shots (vaccines) for HPV and hepatitis.  If you are at risk of being infected with HIV, it is advised that you take a certain medicine daily to prevent HIV infection. This is called pre-exposure prophylaxis (PrEP). You may be at risk if:  You are a man who has sex with other men (MSM).  You are attracted to the opposite sex (heterosexual) and are having sex with more than one partner.  You take drugs with a needle.  You have sex with someone who has HIV.  Talk with your doctor about if you are at high risk of being infected with HIV. If you begin to take PrEP, get tested for HIV first. Get tested every 3 months for as long as you are taking PrEP. WHAT SHOULD I DO IF I THINK I HAVE AN STD?  See your doctor.  Tell your sex partner(s) that you have an STD. They should be tested and treated.  Do not have sex until your doctor says it is okay. WHEN SHOULD I GET HELP? Get help right away if:  You have bad belly (abdominal)  pain.  You are a man and have puffiness (swelling) or pain in your testicles.  You are a woman and have puffiness in your vagina. Document Released: 07/09/2004 Document Revised: 06/06/2013 Document Reviewed: 11/25/2012 [facility] Patient Information 2015 [employer], [place]. This information is not intended to replace advice given to you by your health care provider. Make sure you discuss any questions you have with your health care provider.  Please call your doctor for a followup appointment within 24-48 hours. When you talk to your doctor please let them know that you were seen in the emergency department and have them acquire all of your records so that they can discuss the findings with you and formulate a treatment plan to fully care for your new and ongoing problems.

## 2014-11-01 NOTE — ED Notes (Signed)
Pt c/o of yellowish discharge

## 2014-11-03 LAB — HIV ANTIBODY (ROUTINE TESTING W REFLEX): HIV SCREEN 4TH GENERATION: NONREACTIVE

## 2014-11-03 LAB — RPR: RPR Ser Ql: NONREACTIVE

## 2014-11-05 LAB — GC/CHLAMYDIA PROBE AMP (~~LOC~~) NOT AT ARMC
Chlamydia: NEGATIVE
NEISSERIA GONORRHEA: NEGATIVE

## 2014-11-21 ENCOUNTER — Emergency Department (HOSPITAL_COMMUNITY)
Admission: EM | Admit: 2014-11-21 | Discharge: 2014-11-21 | Disposition: A | Discharge status: Home / Self Care | Payer: Self-pay | Attending: Emergency Medicine | Admitting: Emergency Medicine

## 2014-11-21 ENCOUNTER — Encounter (HOSPITAL_COMMUNITY): Payer: Self-pay | Admitting: *Deleted

## 2014-11-21 ENCOUNTER — Emergency Department (HOSPITAL_COMMUNITY): Payer: Self-pay

## 2014-11-21 DIAGNOSIS — Z720 Tobacco use: Secondary | ICD-10-CM | POA: Insufficient documentation

## 2014-11-21 DIAGNOSIS — Z791 Long term (current) use of non-steroidal anti-inflammatories (NSAID): Secondary | ICD-10-CM | POA: Insufficient documentation

## 2014-11-21 DIAGNOSIS — Z79899 Other long term (current) drug therapy: Secondary | ICD-10-CM | POA: Insufficient documentation

## 2014-11-21 DIAGNOSIS — Y9389 Activity, other specified: Secondary | ICD-10-CM | POA: Insufficient documentation

## 2014-11-21 DIAGNOSIS — I10 Essential (primary) hypertension: Secondary | ICD-10-CM | POA: Insufficient documentation

## 2014-11-21 DIAGNOSIS — Z792 Long term (current) use of antibiotics: Secondary | ICD-10-CM | POA: Insufficient documentation

## 2014-11-21 DIAGNOSIS — J449 Chronic obstructive pulmonary disease, unspecified: Secondary | ICD-10-CM | POA: Insufficient documentation

## 2014-11-21 DIAGNOSIS — Z043 Encounter for examination and observation following other accident: Secondary | ICD-10-CM | POA: Insufficient documentation

## 2014-11-21 DIAGNOSIS — W19XXXA Unspecified fall, initial encounter: Secondary | ICD-10-CM

## 2014-11-21 DIAGNOSIS — W1839XA Other fall on same level, initial encounter: Secondary | ICD-10-CM | POA: Insufficient documentation

## 2014-11-21 DIAGNOSIS — F1010 Alcohol abuse, uncomplicated: Secondary | ICD-10-CM | POA: Insufficient documentation

## 2014-11-21 DIAGNOSIS — R197 Diarrhea, unspecified: Secondary | ICD-10-CM | POA: Insufficient documentation

## 2014-11-21 DIAGNOSIS — Y9289 Other specified places as the place of occurrence of the external cause: Secondary | ICD-10-CM | POA: Insufficient documentation

## 2014-11-21 DIAGNOSIS — E119 Type 2 diabetes mellitus without complications: Secondary | ICD-10-CM | POA: Insufficient documentation

## 2014-11-21 DIAGNOSIS — Y998 Other external cause status: Secondary | ICD-10-CM | POA: Insufficient documentation

## 2014-11-21 LAB — CBC WITH DIFFERENTIAL/PLATELET
BASOS ABS: 0 10*3/uL (ref 0.0–0.1)
BASOS PCT: 1 % (ref 0–1)
EOS ABS: 0.1 10*3/uL (ref 0.0–0.7)
Eosinophils Relative: 1 % (ref 0–5)
HCT: 36.3 % (ref 36.0–46.0)
HEMOGLOBIN: 12.9 g/dL (ref 12.0–15.0)
LYMPHS ABS: 2.6 10*3/uL (ref 0.7–4.0)
Lymphocytes Relative: 62 % — ABNORMAL HIGH (ref 12–46)
MCH: 29.6 pg (ref 26.0–34.0)
MCHC: 35.5 g/dL (ref 30.0–36.0)
MCV: 83.3 fL (ref 78.0–100.0)
MONOS PCT: 11 % (ref 3–12)
Monocytes Absolute: 0.5 10*3/uL (ref 0.1–1.0)
NEUTROS ABS: 1 10*3/uL — AB (ref 1.7–7.7)
Neutrophils Relative %: 25 % — ABNORMAL LOW (ref 43–77)
PLATELETS: 214 10*3/uL (ref 150–400)
RBC: 4.36 MIL/uL (ref 3.87–5.11)
RDW: 13.5 % (ref 11.5–15.5)
WBC: 4.1 10*3/uL (ref 4.0–10.5)

## 2014-11-21 LAB — COMPREHENSIVE METABOLIC PANEL
ALBUMIN: 4.4 g/dL (ref 3.5–5.0)
ALK PHOS: 122 U/L (ref 38–126)
ALT: 28 U/L (ref 14–54)
AST: 75 U/L — ABNORMAL HIGH (ref 15–41)
Anion gap: 12 (ref 5–15)
BUN: 8 mg/dL (ref 6–20)
CHLORIDE: 91 mmol/L — AB (ref 101–111)
CO2: 28 mmol/L (ref 22–32)
Calcium: 8.8 mg/dL — ABNORMAL LOW (ref 8.9–10.3)
Creatinine, Ser: 0.66 mg/dL (ref 0.44–1.00)
GFR calc non Af Amer: >60 mL/min (ref >60)
Glucose, Bld: 87 mg/dL (ref 65–99)
Potassium: 4 mmol/L (ref 3.5–5.1)
Sodium: 131 mmol/L — ABNORMAL LOW (ref 135–145)
Total Bilirubin: 0.6 mg/dL (ref 0.3–1.2)
Total Protein: 8 g/dL (ref 6.5–8.1)

## 2014-11-21 MED ORDER — FOLIC ACID 5 MG/ML IJ SOLN
1.0000 mg | Freq: Once | INTRAMUSCULAR | Status: AC
  Administered 2014-11-21: 1 mg via INTRAVENOUS
  Filled 2014-11-21: qty 0.2

## 2014-11-21 MED ORDER — FOLIC ACID 1 MG PO TABS: 1.0000 mg | ORAL_TABLET | Freq: Every day | ORAL | Status: DC

## 2014-11-21 MED ORDER — THIAMINE HCL 100 MG/ML IJ SOLN
100.0000 mg | Freq: Once | INTRAMUSCULAR | Status: AC
  Administered 2014-11-21: 100 mg via INTRAVENOUS
  Filled 2014-11-21: qty 2

## 2014-11-21 MED ORDER — VITAMIN B-1 100 MG PO TABS: 100.0000 mg | ORAL_TABLET | Freq: Every day | ORAL | Status: DC

## 2014-11-21 MED ORDER — SODIUM CHLORIDE 0.9 % IV BOLUS (SEPSIS)
1000.0000 mL | Freq: Once | INTRAVENOUS | Status: AC
  Administered 2014-11-21: 1000 mL via INTRAVENOUS

## 2014-11-21 MED ORDER — CHLORDIAZEPOXIDE HCL 25 MG PO CAPS: ORAL_CAPSULE | ORAL | Status: DC

## 2014-11-21 NOTE — Discharge Instructions (Signed)
Alcohol Use Disorder °Alcohol use disorder is a mental disorder. It is not a one-time incident of heavy drinking. Alcohol use disorder is the excessive and uncontrollable use of alcohol over time that leads to problems with functioning in one or more areas of daily living. People with this disorder risk harming themselves and others when they drink to excess. Alcohol use disorder also can cause other mental disorders, such as mood and anxiety disorders, and serious physical problems. People with alcohol use disorder often misuse other drugs.  °Alcohol use disorder is common and widespread. Some people with this disorder drink alcohol to cope with or escape from negative life events. Others drink to relieve chronic pain or symptoms of mental illness. People with a family history of alcohol use disorder are at higher risk of losing control and using alcohol to excess.  °SYMPTOMS  °Signs and symptoms of alcohol use disorder may include the following:  °· Consumption of alcohol in larger amounts or over a longer period of time than intended. °· Multiple unsuccessful attempts to cut down or control alcohol use.   °· A great deal of time spent obtaining alcohol, using alcohol, or recovering from the effects of alcohol (hangover). °· A strong desire or urge to use alcohol (cravings).   °· Continued use of alcohol despite problems at work, school, or home because of alcohol use.   °· Continued use of alcohol despite problems in relationships because of alcohol use. °· Continued use of alcohol in situations when it is physically hazardous, such as driving a car. °· Continued use of alcohol despite awareness of a physical or psychological problem that is likely related to alcohol use. Physical problems related to alcohol use can involve the brain, heart, liver, stomach, and intestines. Psychological problems related to alcohol use include intoxication, depression, anxiety, psychosis, delirium, and dementia.   °· The need for  increased amounts of alcohol to achieve the same desired effect, or a decreased effect from the consumption of the same amount of alcohol (tolerance). °· Withdrawal symptoms upon reducing or stopping alcohol use, or alcohol use to reduce or avoid withdrawal symptoms. Withdrawal symptoms include: °¨ Racing heart. °¨ Hand tremor. °¨ Difficulty sleeping. °¨ Nausea. °¨ Vomiting. °¨ Hallucinations. °¨ Restlessness. °¨ Seizures. °DIAGNOSIS °Alcohol use disorder is diagnosed through an assessment by your health care provider. Your health care provider may start by asking three or four questions to screen for excessive or problematic alcohol use. To confirm a diagnosis of alcohol use disorder, at least two symptoms must be present within a 12-month period. The severity of alcohol use disorder depends on the number of symptoms: °· Mild--two or three. °· Moderate--four or five. °· Severe--six or more. °Your health care provider may perform a physical exam or use results from lab tests to see if you have physical problems resulting from alcohol use. Your health care provider may refer you to a mental health professional for evaluation. °TREATMENT  °Some people with alcohol use disorder are able to reduce their alcohol use to low-risk levels. Some people with alcohol use disorder need to quit drinking alcohol. When necessary, mental health professionals with specialized training in substance use treatment can help. Your health care provider can help you decide how severe your alcohol use disorder is and what type of treatment you need. The following forms of treatment are available:  °· Detoxification. Detoxification involves the use of prescription medicines to prevent alcohol withdrawal symptoms in the first week after quitting. This is important for people with a history of symptoms   of withdrawal and for heavy drinkers who are likely to have withdrawal symptoms. Alcohol withdrawal can be dangerous and, in severe cases, cause  death. Detoxification is usually provided in a hospital or in-patient substance use treatment facility. °· Counseling or talk therapy. Talk therapy is provided by substance use treatment counselors. It addresses the reasons people use alcohol and ways to keep them from drinking again. The goals of talk therapy are to help people with alcohol use disorder find healthy activities and ways to cope with life stress, to identify and avoid triggers for alcohol use, and to handle cravings, which can cause relapse. °· Medicines. Different medicines can help treat alcohol use disorder through the following actions: °¨ Decrease alcohol cravings. °¨ Decrease the positive reward response felt from alcohol use. °¨ Produce an uncomfortable physical reaction when alcohol is used (aversion therapy). °· Support groups. Support groups are run by people who have quit drinking. They provide emotional support, advice, and guidance. °These forms of treatment are often combined. Some people with alcohol use disorder benefit from intensive combination treatment provided by specialized substance use treatment centers. Both inpatient and outpatient treatment programs are available. °Document Released: 07/09/2004 Document Revised: 10/16/2013 Document Reviewed: 09/08/2012 °ExitCare® Patient Information ©2015 [employer]. This information is not intended to replace advice given to you by your health care provider. Make sure you discuss any questions you have with your health care provider. ° ° ° ° °Emergency Department Resource Guide °1) Find a Doctor and Pay Out of Pocket °Although you won't have to find out who is covered by your insurance plan, it is a good idea to ask around and get recommendations. You will then need to call the office and see if the doctor you have chosen will accept you as a new patient and what types of options they offer for patients who are self-pay. Some doctors offer discounts or will set up payment plans for  their patients who do not have insurance, but you will need to ask so you aren't surprised when you get to your appointment. ° °2) Contact Your Local Health Department °Not all health departments have doctors that can see patients for sick visits, but many do, so it is worth a call to see if yours does. If you don't know where your local health department is, you can check in your phone book. The CDC also has a tool to help you locate your state's health department, and many state websites also have listings of all of their local health departments. ° °3) Find a Walk-in Clinic °If your illness is not likely to be very severe or complicated, you may want to try a walk in clinic. These are popping up all over the country in pharmacies, drugstores, and shopping centers. They're usually staffed by nurse practitioners or physician assistants that have been trained to treat common illnesses and complaints. They're usually fairly quick and inexpensive. However, if you have serious medical issues or chronic medical problems, these are probably not your best option. ° °No Primary Care Doctor: °- Call [facility] at  832-8000 - they can help you locate a primary care doctor that  accepts your insurance, provides certain services, etc. °- Physician Referral Service- 1-800-533-3463 ° °Chronic Pain Problems: °Organization         Address  Phone   Notes  °[facility]  (336) 297-2271 Patients need to be referred by their primary care doctor.  ° °Medication Assistance: °Organization           Address  Phone   Notes  °[place] [facility] [address] °[place], [place] [address] (336) 641-8030 --Must be a resident of [place] °-- Must have NO insurance coverage whatsoever (no Medicaid/ Medicare, etc.) °-- The pt. MUST have a primary care doctor that directs their care regularly and follows them in the community °  °[facility]  (866) 331-1348   °[employer]  (888)  892-1162   ° °Agencies that provide inexpensive medical care: °Organization         Address  Phone   Notes  °[facility]  (336) 832-8035   °[facility]    (336) 832-7272   °[facility] [address] °[place], [place] [address] (336) 832-4777   °[facility] [address], °[place] (336) 271-4999   °[facility]    (336) 373-0678   °[facility]    (336) 272-1050   °[facility] ° [address], [place] Phone:  (336) 832-4444, Fax:  (336) 832-4440 Hours of Operation:  9 am - 6 pm, M-F.  Also accepts Medicaid/Medicare and self-pay.  °[facility] ° [address], [place] Phone: (336) 832-3150, Fax: (336) 832-3151. Hours of Operation:  8:30 am - 5:30 pm, M-F.  Also accepts Medicaid and self-pay.  °[facility] [place] [address], [place] Phone: (336) 878-6027   °[facility] [address], [place], [place] (336)723-1848, Ext. 123 Mondays & Thursdays: 7-9 AM.  First 15 patients are seen on a first come, first serve basis. °  ° °Medicaid-accepting [place] Providers: ° °Organization         Address  Phone   Notes  °[facility] [address], [place] (336) 641-2100 Also accepts self-pay patients.  °[facility] [address], [place] ° (336) 856-9996   °[facility] [address], [place] (336) 288-8857   °[facility] [address] [place] [address], [place] (336) 299-7000   °Veita Bland [address], [place]  ° (336) 373-1557 Only accepts [place] Access Medicaid patients after they have their name applied to their card.  ° °Self-Pay (no insurance) in [place]: ° °Organization         Address  Phone   Notes  °Sickle Cell Patients, [facility] [address], [place] (336)  832-1970   °[facility] [address], [place] (336) 832-4400   °[facility] ° [address], [place] (336) 992-4800   °[facility]/Dr. Osei-Bonsu ° [address] [place] [address], [place] or [address], [place] (336) 841-8500 Phone number for both [place] and [place] locations is the same.  °[facility] [address], [place] (336) 299-0000   °[facility] [address] [place] [address], [place] or [address] (336) 852-7530 °(336) 878-2260   °[facility] [address],  (336) 350-1642, phone; (336) 294-5005, fax Sees patients 1st and 3rd Saturday of every month.  Must not qualify for public or private insurance (i.e. Medicaid, Medicare, [place] Health Choice, Veterans' Benefits) • Household income should be no more than 200% of the poverty level •The clinic cannot treat you if you are pregnant or think you   are pregnant • Sexually transmitted diseases are not treated at the clinic.  ° ° °Dental Care: °Organization         Address  Phone  Notes  °[facility] [address], [place] (336) 641-6152 Accepts children up to age 21 who are enrolled in Medicaid or [place] Health Choice; pregnant women with a Medicaid card; and children who have applied for Medicaid or [place] Health Choice, but were declined, whose parents can pay a reduced fee at time of service.  °[facility] [place]  [address], [place] (336) 641-7733 Accepts children up to age 21 who are enrolled in Medicaid or [place] Health Choice; pregnant women with a Medicaid card; and children who have applied for Medicaid or [place] Health Choice, but were declined, whose parents can pay a reduced fee at time of service.  °[facility] ° [address], [place] (336) 641-4533 Patients are  seen by appointment only. Walk-ins are not accepted. [facility] will see patients 18 years of age and older. °Monday - Tuesday (8am-5pm) °Most Wednesdays (8:30-5pm) °$30 per visit, cash only  °[facility] ° [address], [place] (336) 641-4533 Patients are seen by appointment only. Walk-ins are not accepted. [facility] will see patients 18 years of age and older. °One Wednesday Evening (Monthly: Volunteer Based).  $30 per visit, cash only  °[facility]  (919) 537-3737 for adults; Children under age 4, call Graduate Pediatric Dentistry at (919) [phone]. Children aged 4-14, please call (919) [phone] to request a pediatric application. ° Dental services are provided in all areas of dental care including fillings, crowns and bridges, complete and partial dentures, implants, gum treatment, root canals, and extractions. Preventive care is also provided. Treatment is provided to both adults and children. °Patients are selected via a lottery and there is often a waiting list. °  °[facility] [address], °[place] ° (336) 763-8833 [email or web address] °  °[facility] [address], [place], [place] (336)723-1848, Ext. 123 Second and Fourth Thursday of each month, opens at 6:30 AM; Clinic ends at 9 AM.  Patients are seen on a first-come first-served basis, and a limited number are seen during each clinic.  ° °[facility] ° [address], [place], [place] (336) 723-7904   Eligibility Requirements °You must have lived in [place], [place], or [place] counties for at least the last three months. °  You cannot be eligible for state or federal sponsored healthcare insurance, including Veterans Administration, Medicaid, or Medicare. °  You generally cannot be eligible for healthcare insurance through your employer.  °  How to apply: °Eligibility screenings are held every Tuesday and Wednesday afternoon from 1:00 pm until 4:00  pm. You do not need an appointment for the interview!  °[facility] [address], [place], [place] 336-631-2330   °[facility]  336-342-8273   °[facility]  336-703-3100   °[facility]  336-570-6415   ° °Behavioral Health Resources in the Community: °Intensive Outpatient Programs °Organization         Address  Phone  Notes  °[facility] [address], [place], [place] 336-878-6098   °[facility] [address], [place], [place] 336-832-9800   °[facility] [address], [place], [place] ° 336-882-2125   °[facility] [address],  °[place], [place] 1-800-853-5163 or 336-641-4981   °Substance Abuse Resources °Organization         Address  Phone  Notes  °[facility]  336-882-2125   °[facility]  336-784-9470   °[facility]  336-285-9073   °[facility]  336-845-3988   °Residential & Outpatient Substance Abuse Program  1-800-659-3381   °Psychological Services °Organization         Address  Phone  Notes  °[facility]  336- 832-9600   °[facility]  336- 378-7881   °[facility] [address], [place] 1-800-853-5163 or 336-641-4981   ° °Mobile Crisis Teams °Organization         Address  Phone  Notes  °[facility], Mobile Crisis Care Unit  1-877-626-1772   °Assertive °[facility] ° [address] [place], [place] 336-834-9664   °Sharon DeEsch [address] °[place] 336-554-5454   ° °Self-Help/Support Groups °Organization         Address  Phone             Notes  °[facility] - variety of support groups  336- 373-1402 Call for more information  °Narcotics Anonymous (NA), [facility] [address], °[place]  2 meetings at this location  ° °Residential Treatment Programs °Organization          Address  Phone  Notes  °[facility] [address],    °[place]  1-866-801-8205   °[facility] ° [address], [place], [place] 704-293-8524   °[facility] [address], [place] 336-845-3988 Admissions: 8am-3pm M-F  °[facility] [address]    °[place], [place] 336-841-1104   °[facility] [address], [place], [place] 336-379-7146   °[facility] [address]  °[place], [place] 336-285-9073   °[facility] - Intensive Outpatient [address], , [place] 336-852-3033   °[facility] [address]  °[place], [place] 1-877-615-2722 or 336-784-9470   °[facility] [address] [place], [place] 336-227-7417 Accepts Medicaid  °[facility] [address]  ° [place] 1-800-659-3381 Substance Abuse/Addiction Treatment  ° °[place] Behavioral Health Resources °Organization         Address  Phone  Notes  °[facility]  (888) 581-9988   °Julie Brannon, PhD [address] [place], [place]   (336) 349-5553 or (336) 951-0000   °[facility]   [address] °[place], [place] (336) 349-4454   °[facility] [address], [place], [place] (336) 342-8316 Insurance/Medicaid/sponsorship through [facility]  °[facility] [address]                                    [place], [place] (336) 342-8316 Therapy/tele-psych/case  °[facility] [address]  ° [place], [place] (336) 349-2233    °Dr. Arfeen  (336) 349-4544   °[facility] [place]  [employer] [place] [facility] 1) [address], [place] °2) [address], [place] °3)  [address], [place] (336) 349-3220 °(336) 342-7768 ° °(336) 342-8140   °[place] [facility] (336)   342-1394 or (336) 342-3537 (After Hours)    ° ° ° °

## 2014-11-21 NOTE — ED Provider Notes (Signed)
TIME SEEN: 9:10 PM   CHIEF COMPLAINT: Medical Clearance (Alcohol Problem)  HPI: HPI Comments: Gloria Santana is a 59 y.o. female who presents to the Emergency Department for medical clearance. Pt states she has an alcohol problem. Pt states she drinks about 3-4 40 ounce beers per day. Pt states that she has not been sober in 3 years. Pt reports HX of seizures during withdrawal. PT reports diarrhea for the past week. Pt also report having a HA. Reports multiple recent falls while intoxicated. Not on anticoagulation. Denies SI, HI, hallucinations, SOB, CP or fevers. PT reports previous detox program in [place], [place]. Daughter states that she is having mood swings ranging from wanting help to not wanting help at all and wanting to do it on her own. She denies any drug use.   ROS: See HPI Constitutional: no fever  Eyes: no drainage  ENT: no runny nose   Cardiovascular:  no chest pain  Resp: no SOB  GI: Diarrhea GU: no dysuria Integumentary: no rash  Allergy: no hives  Musculoskeletal: no leg swelling  Neurological: no slurred speech ROS otherwise negative  PAST MEDICAL HISTORY/PAST SURGICAL HISTORY:  Past Medical History  Diagnosis Date  . COPD (chronic obstructive pulmonary disease)   . Poor circulation   . Alcohol dependence   . Diabetes mellitus without complication   . Hypertension     MEDICATIONS:  Prior to Admission medications   Medication Sig Start Date End Date Taking? Authorizing Provider  ciprofloxacin (CIPRO) 500 MG tablet Take 1 tablet (500 mg total) by mouth 2 (two) times daily. 11/01/14   Eber Hong, MD  FLUoxetine (PROZAC) 20 MG capsule Take 20 mg by mouth daily.    Historical Provider, MD  meloxicam (MOBIC) 15 MG tablet Take 15 mg by mouth daily.    Historical Provider, MD  metFORMIN (GLUCOPHAGE) 500 MG tablet Take 1 tablet (500 mg total) by mouth daily with breakfast. Patient not taking: Reported on 11/01/2014 12/08/13   Bethann Berkshire, MD  metroNIDAZOLE  (FLAGYL) 500 MG tablet Take 1 tablet (500 mg total) by mouth 2 (two) times daily. 11/01/14   Eber Hong, MD  oxyCODONE-acetaminophen (PERCOCET/ROXICET) 5-325 MG per tablet Take 1 tablet by mouth every 4 (four) hours as needed for severe pain. Patient not taking: Reported on 11/01/2014 05/05/14   Zadie Rhine, MD    ALLERGIES:  No Known Allergies  SOCIAL HISTORY:  History  Substance Use Topics  . Smoking status: Current Every Day Smoker -- 1.00 packs/day    Types: Cigarettes  . Smokeless tobacco: Not on file  . Alcohol Use: 2.4 - 3.6 oz/week    4-6 Cans of beer per week     Comment: 4-6 40 oz beers daily. Family states pt drinks all day everyday. (back to back)    FAMILY HISTORY: Family History  Problem Relation Age of Onset  . Diabetes Mother     EXAM: BP 138/89 mmHg  Pulse 89  Temp(Src) 97.8 F (36.6 C)  Resp 20  SpO2 97% CONSTITUTIONAL: elderly and chronically ill appearing but in no distress intoxicated smells of alcohol with slurred speech.   HEAD: Normocephalic atraumatic EYES: Conjunctivae clear, PERRL ENT: normal nose; no rhinorrhea; moist mucous membranes; pharynx without lesions noted NECK: Supple, no meningismus, no LAD, no midline spinal tenderness or step-off or deformity  CARD: RRR; S1 and S2 appreciated; no murmurs, no clicks, no rubs, no gallops RESP: Normal chest excursion without splinting or tachypnea; breath sounds clear and equal bilaterally; no  wheezes, no rhonchi, no rales, no hypoxia or respiratory distress, speaking full sentences ABD/GI: Normal bowel sounds; non-distended; soft, non-tender, no rebound, no guarding, no peritoneal signs BACK:  The back appears normal and is non-tender to palpation, there is no CVA tenderness, no midline spinal tenderness or step-off or deformity EXT: Normal ROM in all joints; non-tender to palpation; no edema; normal capillary refill; no cyanosis, no calf tenderness or swelling    SKIN: Normal color for age and  race; warm NEURO: Moves all extremities equally, sensation to light touch intact diffusely, cranial nerves II through XII intact, normal gait PSYCH: The patient's mood and manner are appropriate. Grooming and personal hygiene are appropriate.  MEDICAL DECISION MAKING: Patient here requesting detox from alcohol. Has a history of withdrawal seizures but is currently intoxicated. I do not think that the patient is ready for detox. I think she is here because the daughters want her here. She has no psychiatric safety concerns. Family is concerned because she has had diarrhea and has been falling frequently. Will obtain labs and CT of her head and cervical spine. No other sign of trauma on exam. Have explained to patient and family that she will have to obtain detox as an outpatient and I will provide him with resources. Will also discharge with Librium to use as needed for withdrawal symptoms but have discussed with family that this medication can only be given if patient is not drinking. Have discussed with family that they need to be responsible for this prescription.   ED PROGRESS: Patient's labs unremarkable other than mild elevation of her AST. CT imaging shows no acute injury. Family is currently trying to get patient into a detox program. I feel she is safe to be discharged home with her family and they will continue to try to get her into a detox/rehabilitation program. We'll discharge with thiamine, folate and have advised her to start a multivitamin. Also given Librium taper but have talked to them at length about not taking this medication while drinking. Discussed return precautions. They verbalize understanding and are comfortable with plan.   I personally performed the services described in this documentation, which was scribed in my presence. The recorded information has been reviewed and is accurate.    Layla MawKristen N Maegen Wigle, DO 11/22/14 [phone]

## 2014-11-21 NOTE — ED Notes (Signed)
Pt states she is here wanting to get off of alcohol. Pt denies any si/hi but family states the pt is killing herself by drinking too much.

## 2014-11-26 ENCOUNTER — Emergency Department (HOSPITAL_COMMUNITY): Payer: Self-pay

## 2014-11-26 ENCOUNTER — Encounter (HOSPITAL_COMMUNITY): Payer: Self-pay | Admitting: Emergency Medicine

## 2014-11-26 ENCOUNTER — Emergency Department (HOSPITAL_COMMUNITY)
Admission: EM | Admit: 2014-11-26 | Discharge: 2014-11-26 | Disposition: A | Discharge status: Home / Self Care | Payer: Self-pay | Attending: Emergency Medicine | Admitting: Emergency Medicine

## 2014-11-26 DIAGNOSIS — E119 Type 2 diabetes mellitus without complications: Secondary | ICD-10-CM | POA: Insufficient documentation

## 2014-11-26 DIAGNOSIS — I10 Essential (primary) hypertension: Secondary | ICD-10-CM | POA: Insufficient documentation

## 2014-11-26 DIAGNOSIS — F1010 Alcohol abuse, uncomplicated: Secondary | ICD-10-CM

## 2014-11-26 DIAGNOSIS — Z79899 Other long term (current) drug therapy: Secondary | ICD-10-CM | POA: Insufficient documentation

## 2014-11-26 DIAGNOSIS — R112 Nausea with vomiting, unspecified: Secondary | ICD-10-CM

## 2014-11-26 DIAGNOSIS — R042 Hemoptysis: Secondary | ICD-10-CM

## 2014-11-26 DIAGNOSIS — Z792 Long term (current) use of antibiotics: Secondary | ICD-10-CM | POA: Insufficient documentation

## 2014-11-26 DIAGNOSIS — Z791 Long term (current) use of non-steroidal anti-inflammatories (NSAID): Secondary | ICD-10-CM | POA: Insufficient documentation

## 2014-11-26 DIAGNOSIS — Z720 Tobacco use: Secondary | ICD-10-CM | POA: Insufficient documentation

## 2014-11-26 DIAGNOSIS — J449 Chronic obstructive pulmonary disease, unspecified: Secondary | ICD-10-CM | POA: Insufficient documentation

## 2014-11-26 LAB — URINALYSIS, ROUTINE W REFLEX MICROSCOPIC
BILIRUBIN URINE: NEGATIVE
Glucose, UA: NEGATIVE mg/dL
KETONES UR: NEGATIVE mg/dL
NITRITE: NEGATIVE
Protein, ur: NEGATIVE mg/dL
Urobilinogen, UA: 0.2 mg/dL (ref 0.0–1.0)
pH: 5.5 (ref 5.0–8.0)

## 2014-11-26 LAB — CBC WITH DIFFERENTIAL/PLATELET
BASOS ABS: 0 10*3/uL (ref 0.0–0.1)
BASOS PCT: 0 % (ref 0–1)
Eosinophils Absolute: 0.1 10*3/uL (ref 0.0–0.7)
Eosinophils Relative: 2 % (ref 0–5)
HEMATOCRIT: 29.7 % — AB (ref 36.0–46.0)
Hemoglobin: 10.4 g/dL — ABNORMAL LOW (ref 12.0–15.0)
Lymphocytes Relative: 35 % (ref 12–46)
Lymphs Abs: 1.3 10*3/uL (ref 0.7–4.0)
MCH: 29.1 pg (ref 26.0–34.0)
MCHC: 35 g/dL (ref 30.0–36.0)
MCV: 83 fL (ref 78.0–100.0)
Monocytes Absolute: 0.6 10*3/uL (ref 0.1–1.0)
Monocytes Relative: 17 % — ABNORMAL HIGH (ref 3–12)
NEUTROS ABS: 1.8 10*3/uL (ref 1.7–7.7)
NEUTROS PCT: 46 % (ref 43–77)
Platelets: 108 10*3/uL — ABNORMAL LOW (ref 150–400)
RBC: 3.58 MIL/uL — ABNORMAL LOW (ref 3.87–5.11)
RDW: 13.3 % (ref 11.5–15.5)
WBC: 3.8 10*3/uL — ABNORMAL LOW (ref 4.0–10.5)

## 2014-11-26 LAB — BASIC METABOLIC PANEL
ANION GAP: 12 (ref 5–15)
BUN: >5 mg/dL — ABNORMAL LOW (ref 6–20)
CALCIUM: 8.3 mg/dL — AB (ref 8.9–10.3)
CO2: 26 mmol/L (ref 22–32)
CREATININE: 0.48 mg/dL (ref 0.44–1.00)
Chloride: 86 mmol/L — ABNORMAL LOW (ref 101–111)
GFR calc Af Amer: >60 mL/min (ref >60)
GFR calc non Af Amer: >60 mL/min (ref >60)
GLUCOSE: 97 mg/dL (ref 65–99)
Potassium: 3.6 mmol/L (ref 3.5–5.1)
SODIUM: 124 mmol/L — AB (ref 135–145)

## 2014-11-26 LAB — PROTIME-INR
INR: 1.23 (ref 0.00–1.49)
Prothrombin Time: 15.7 seconds — ABNORMAL HIGH (ref 11.6–15.2)

## 2014-11-26 LAB — URINE MICROSCOPIC-ADD ON

## 2014-11-26 LAB — HEPATIC FUNCTION PANEL
ALT: 22 U/L (ref 14–54)
AST: 49 U/L — ABNORMAL HIGH (ref 15–41)
Albumin: 3.6 g/dL (ref 3.5–5.0)
Alkaline Phosphatase: 90 U/L (ref 38–126)
BILIRUBIN INDIRECT: 0.7 mg/dL (ref 0.3–0.9)
Bilirubin, Direct: 0.3 mg/dL (ref 0.1–0.5)
Total Bilirubin: 1 mg/dL (ref 0.3–1.2)
Total Protein: 6.6 g/dL (ref 6.5–8.1)

## 2014-11-26 LAB — TYPE AND SCREEN
ABO/RH(D): O POS
ANTIBODY SCREEN: NEGATIVE

## 2014-11-26 LAB — ETHANOL: ALCOHOL ETHYL (B): 350 mg/dL — AB (ref <5)

## 2014-11-26 LAB — APTT: aPTT: 33 seconds (ref 24–37)

## 2014-11-26 MED ORDER — ONDANSETRON HCL 4 MG/2ML IJ SOLN: 4.0000 mg | Freq: Once | INTRAMUSCULAR | Status: DC

## 2014-11-26 MED ORDER — THIAMINE HCL 100 MG/ML IJ SOLN
100.0000 mg | Freq: Once | INTRAMUSCULAR | Status: AC
  Administered 2014-11-26: 100 mg via INTRAVENOUS
  Filled 2014-11-26: qty 2

## 2014-11-26 MED ORDER — FOLIC ACID 5 MG/ML IJ SOLN
1.0000 mg | Freq: Once | INTRAMUSCULAR | Status: AC
  Administered 2014-11-26: 1 mg via INTRAVENOUS
  Filled 2014-11-26: qty 0.2

## 2014-11-26 MED ORDER — SODIUM CHLORIDE 0.9 % IV BOLUS (SEPSIS)
1000.0000 mL | Freq: Once | INTRAVENOUS | Status: AC
  Administered 2014-11-26: 1000 mL via INTRAVENOUS

## 2014-11-26 NOTE — ED Provider Notes (Signed)
TIME SEEN: 8:26 PM   CHIEF COMPLAINT: Cough, hemoptysis, alcohol abuse  HPI: HPI Comments: Gloria Santana is a 59 y.o. female with PMHx of COPD, alcohol dependence, DM and HTN who presents to the Emergency Department complaining of productive cough onset today. Family states that she has been have small streaks of blood in her sputum. Family states that she has been vomiting for the past 2 days as well. No blood in her vomit. No coffee-ground emesis. No bloody stool or melena. No abdominal pain. No chest pain or shortness of breath. Pt states she was not able to get into rehab program since her last visit. Family states she is still drinking alcohol daily. No drug use. Not on anticoagulation.    ROS: See HPI Constitutional: no fever  Eyes: no drainage  ENT: no runny nose   Cardiovascular:  no chest pain  Resp: no SOB  GI: Vomiting  GU: no dysuria Integumentary: no rash  Allergy: no hives  Musculoskeletal: no leg swelling  Neurological: no slurred speech ROS otherwise negative  PAST MEDICAL HISTORY/PAST SURGICAL HISTORY:  Past Medical History  Diagnosis Date  . COPD (chronic obstructive pulmonary disease)   . Poor circulation   . Alcohol dependence   . Diabetes mellitus without complication   . Hypertension     MEDICATIONS:  Prior to Admission medications   Medication Sig Start Date End Date Taking? Authorizing Provider  chlordiazePOXIDE (LIBRIUM) 25 MG capsule  PO TID x 1D, then 25-50mg  PO BID X 1D, then 25-50mg  PO QD X 1D 11/21/14   Kristen N Ward, DO  ciprofloxacin (CIPRO) 500 MG tablet Take 1 tablet (500 mg total) by mouth 2 (two) times daily. 11/01/14   Eber Hong, MD  FLUoxetine (PROZAC) 20 MG capsule Take 20 mg by mouth daily.    Historical Provider, MD  folic acid (FOLVITE) 1 MG tablet Take 1 tablet (1 mg total) by mouth daily. 11/21/14   Kristen N Ward, DO  meloxicam (MOBIC) 15 MG tablet Take 15 mg by mouth daily.    Historical Provider, MD  metFORMIN (GLUCOPHAGE)  500 MG tablet Take 1 tablet (500 mg total) by mouth daily with breakfast. Patient not taking: Reported on 11/01/2014 12/08/13   Bethann Berkshire, MD  metroNIDAZOLE (FLAGYL) 500 MG tablet Take 1 tablet (500 mg total) by mouth 2 (two) times daily. 11/01/14   Eber Hong, MD  oxyCODONE-acetaminophen (PERCOCET/ROXICET) 5-325 MG per tablet Take 1 tablet by mouth every 4 (four) hours as needed for severe pain. Patient not taking: Reported on 11/01/2014 05/05/14   Zadie Rhine, MD  thiamine (VITAMIN B-1) 100 MG tablet Take 1 tablet (100 mg total) by mouth daily. 11/21/14   Kristen N Ward, DO    ALLERGIES:  No Known Allergies  SOCIAL HISTORY:  History  Substance Use Topics  . Smoking status: Current Every Day Smoker -- 1.00 packs/day    Types: Cigarettes  . Smokeless tobacco: Not on file  . Alcohol Use: 2.4 - 3.6 oz/week    4-6 Cans of beer per week     Comment: 4-6 40 oz beers daily. Family states pt drinks all day everyday. (back to back)    FAMILY HISTORY: Family History  Problem Relation Age of Onset  . Diabetes Mother     EXAM: BP 107/77 mmHg  Pulse 92  Temp(Src) 98.6 F (37 C)  Resp 20  Ht  (1.727 m)  Wt 110 lb (49.896 kg)  BMI 16.73 kg/m2  SpO2 94%  CONSTITUTIONAL: Alert and oriented and responds appropriately to questions. Well-appearing; well-nourished, appears intoxicated but is in no distress HEAD: Normocephalic EYES: Conjunctivae clear, PERRL ENT: normal nose; no rhinorrhea; moist mucous membranes; pharynx without lesions noted NECK: Supple, no meningismus, no LAD  CARD: RRR; S1 and S2 appreciated; no murmurs, no clicks, no rubs, no gallops RESP: Normal chest excursion without splinting or tachypnea; breath sounds clear and equal bilaterally; no wheezes, no rhonchi, no rales, no hypoxia or respiratory distress, speaking full sentences ABD/GI: Normal bowel sounds; non-distended; soft, non-tender, no rebound, no guarding, no peritoneal signs RECTAL:  No gross blood  or melena, normal rectal tone, small external nonthrombosed hemorrhoids, guaiac negative BACK:  The back appears normal and is non-tender to palpation, there is no CVA tenderness EXT: Normal ROM in all joints; non-tender to palpation; no edema; normal capillary refill; no cyanosis, no calf tenderness or swelling    SKIN: Normal color for age and race; warm NEURO: Moves all extremities equally, sensation to light touch intact diffusely, cranial nerves II through XII intact PSYCH: The patient's mood and manner are appropriate. Grooming and personal hygiene are appropriate.  MEDICAL DECISION MAKING: Patient here with mild hemoptysis, vomiting today and intoxication. She is not vomiting blood or having coffee-ground emesis despite nursing notes. No chest pain or shortness of breath. No abdominal pain. She is guaiac negative. Will check labs, chest x-ray will give IV fluids, Zofran, thiamine and folate.  ED PROGRESS: Patient's labs show sodium of 124 likely from her chronic alcohol abuse and dehydration. She has received IV fluids in the ED. Her alcohol level is 350 but she has a sober driver, her daughter to take her home. She has not vomited on the ED and has not had any episodes of hemoptysis. Chest x-ray shows findings of COPD but no other abnormality. Police have arrived with IVC paperwork has been taken out the patient's other daughter Boyd Kerbs because of her alcohol abuse. Patient is not having SI, HI, hallucinations or psychosis. Have discussed this with patient and her family today and on her last visit that we cannot involuntary commit her because of substance abuse.  Will resend her IVC paperwork. I feel she is safe to be discharged home with outpatient follow-up. I have discussed with family that although it is unfortunate she continues to drink this is not a reason to keep her in the hospital. Daughter who is at bedside verbalizes understanding.   I personally performed the services described in  this documentation, which was scribed in my presence. The recorded information has been reviewed and is accurate.     Layla Maw Ward, DO 11/26/14 2252

## 2014-11-26 NOTE — ED Notes (Signed)
Pt c/o vomiting bright red blood that started today. Pt has been continuously drinking alcohol.

## 2014-11-26 NOTE — ED Notes (Signed)
CRITICAL VALUE ALERT  Critical value received:  etoh 350  Date of notification:  11/26/14  Time of notification:  2126  Critical value read back:Yes.    Nurse who received alert:  [facility]  MD notified (1st page):  Ward  Time of first page:  2136  MD notified (2nd page):  Time of second page:  Responding MD:  Ward  Time MD responded:  2136

## 2014-11-26 NOTE — ED Notes (Signed)
[facility] in to serve pt with IVC papers. EDP notified and explained to family that pt could not be IVC's for substance abuse. Also explained that pt was stable medically and was to be discharged.

## 2014-11-26 NOTE — Discharge Instructions (Signed)
Alcohol Use Disorder °Alcohol use disorder is a mental disorder. It is not a one-time incident of heavy drinking. Alcohol use disorder is the excessive and uncontrollable use of alcohol over time that leads to problems with functioning in one or more areas of daily living. People with this disorder risk harming themselves and others when they drink to excess. Alcohol use disorder also can cause other mental disorders, such as mood and anxiety disorders, and serious physical problems. People with alcohol use disorder often misuse other drugs.  °Alcohol use disorder is common and widespread. Some people with this disorder drink alcohol to cope with or escape from negative life events. Others drink to relieve chronic pain or symptoms of mental illness. People with a family history of alcohol use disorder are at higher risk of losing control and using alcohol to excess.  °SYMPTOMS  °Signs and symptoms of alcohol use disorder may include the following:  °· Consumption of alcohol in larger amounts or over a longer period of time than intended. °· Multiple unsuccessful attempts to cut down or control alcohol use.   °· A great deal of time spent obtaining alcohol, using alcohol, or recovering from the effects of alcohol (hangover). °· A strong desire or urge to use alcohol (cravings).   °· Continued use of alcohol despite problems at work, school, or home because of alcohol use.   °· Continued use of alcohol despite problems in relationships because of alcohol use. °· Continued use of alcohol in situations when it is physically hazardous, such as driving a car. °· Continued use of alcohol despite awareness of a physical or psychological problem that is likely related to alcohol use. Physical problems related to alcohol use can involve the brain, heart, liver, stomach, and intestines. Psychological problems related to alcohol use include intoxication, depression, anxiety, psychosis, delirium, and dementia.   °· The need for  increased amounts of alcohol to achieve the same desired effect, or a decreased effect from the consumption of the same amount of alcohol (tolerance). °· Withdrawal symptoms upon reducing or stopping alcohol use, or alcohol use to reduce or avoid withdrawal symptoms. Withdrawal symptoms include: °· Racing heart. °· Hand tremor. °· Difficulty sleeping. °· Nausea. °· Vomiting. °· Hallucinations. °· Restlessness. °· Seizures. °DIAGNOSIS °Alcohol use disorder is diagnosed through an assessment by your health care provider. Your health care provider may start by asking three or four questions to screen for excessive or problematic alcohol use. To confirm a diagnosis of alcohol use disorder, at least two symptoms must be present within a 12-month period. The severity of alcohol use disorder depends on the number of symptoms: °· Mild--two or three. °· Moderate--four or five. °· Severe--six or more. °Your health care provider may perform a physical exam or use results from lab tests to see if you have physical problems resulting from alcohol use. Your health care provider may refer you to a mental health professional for evaluation. °TREATMENT  °Some people with alcohol use disorder are able to reduce their alcohol use to low-risk levels. Some people with alcohol use disorder need to quit drinking alcohol. When necessary, mental health professionals with specialized training in substance use treatment can help. Your health care provider can help you decide how severe your alcohol use disorder is and what type of treatment you need. The following forms of treatment are available:  °· Detoxification. Detoxification involves the use of prescription medicines to prevent alcohol withdrawal symptoms in the first week after quitting. This is important for people with a history of symptoms   of withdrawal and for heavy drinkers who are likely to have withdrawal symptoms. Alcohol withdrawal can be dangerous and, in severe cases, cause  death. Detoxification is usually provided in a hospital or in-patient substance use treatment facility.  Counseling or talk therapy. Talk therapy is provided by substance use treatment counselors. It addresses the reasons people use alcohol and ways to keep them from drinking again. The goals of talk therapy are to help people with alcohol use disorder find healthy activities and ways to cope with life stress, to identify and avoid triggers for alcohol use, and to handle cravings, which can cause relapse.  Medicines.Different medicines can help treat alcohol use disorder through the following actions:  Decrease alcohol cravings.  Decrease the positive reward response felt from alcohol use.  Produce an uncomfortable physical reaction when alcohol is used (aversion therapy).  Support groups. Support groups are run by people who have quit drinking. They provide emotional support, advice, and guidance. These forms of treatment are often combined. Some people with alcohol use disorder benefit from intensive combination treatment provided by specialized substance use treatment centers. Both inpatient and outpatient treatment programs are available. Document Released: 07/09/2004 Document Revised: 10/16/2013 Document Reviewed: 09/08/2012 [facility] Patient Information 2015 [place], [place]. This information is not intended to replace advice given to you by your health care provider. Make sure you discuss any questions you have with your health care provider.  Hemoptysis Hemoptysis, which means coughing up blood, can be a sign of a minor problem or a serious medical condition. The blood that is coughed up may come from the lungs and airways. Coughed-up blood can also come from bleeding that occurs outside the lungs and airways. Blood can drain into the windpipe during a severe nosebleed or when blood is vomited from the stomach. Because hemoptysis can be a sign of something serious, a medical evaluation is  required. For some people with hemoptysis, no definite cause is ever identified. CAUSES  The most common cause of hemoptysis is bronchitis. Some other common causes include:   A ruptured blood vessel caused by coughing or an infection.   A medical condition that causes damage to the large air passageways (bronchiectasis).   A blood clot in the lungs (pulmonary embolism).   Pneumonia.   Tuberculosis.   Breathing in a small foreign object.   Cancer. For some people with hemoptysis, no definite cause is ever identified.  HOME CARE INSTRUCTIONS  Only take over-the-counter or prescription medicines as directed by your caregiver. Do not use cough suppressants unless your caregiver approves.  If your caregiver prescribes antibiotic medicines, take them as directed. Finish them even if you start to feel better.  Do not smoke. Also avoid secondhand smoke.  Follow up with your caregiver as directed. SEEK IMMEDIATE MEDICAL CARE IF:   You cough up bloody mucus for longer than a week.  You have a blood-producing cough that is severe or getting worse.  You have a blood-producing cough thatcomes and goes over time.  You develop problems with your breathing.   You vomit blood.  You develop bloody or black-colored stools.  You have chest pain.   You develop night sweats.  You feel faint or pass out.   You have a fever or persistent symptoms for more than 2-3 days.  You have a fever and your symptoms suddenly get worse. MAKE SURE YOU:  Understand these instructions.  Will watch your condition.  Will get help right away if you are not doing well  or get worse. Document Released: 08/10/2001 Document Revised: 05/18/2012 Document Reviewed: 03/18/2012 [facility] Patient Information 2015 [place], [place]. This information is not intended to replace advice given to you by your health care provider. Make sure you discuss any questions you have with your health care  provider.    Emergency Department Resource Guide 1) Find a Doctor and Pay Out of Pocket Although you won't have to find out who is covered by your insurance plan, it is a good idea to ask around and get recommendations. You will then need to call the office and see if the doctor you have chosen will accept you as a new patient and what types of options they offer for patients who are self-pay. Some doctors offer discounts or will set up payment plans for their patients who do not have insurance, but you will need to ask so you aren't surprised when you get to your appointment.  2) Contact Your Local Health Department Not all health departments have doctors that can see patients for sick visits, but many do, so it is worth a call to see if yours does. If you don't know where your local health department is, you can check in your phone book. The CDC also has a tool to help you locate your state's health department, and many state websites also have listings of all of their local health departments.  3) Find a Walk-in Clinic If your illness is not likely to be very severe or complicated, you may want to try a walk in clinic. These are popping up all over the country in pharmacies, drugstores, and shopping centers. They're usually staffed by nurse practitioners or physician assistants that have been trained to treat common illnesses and complaints. They're usually fairly quick and inexpensive. However, if you have serious medical issues or chronic medical problems, these are probably not your best option.  No Primary Care Doctor: - Call Health Connect at  [phone] - they can help you locate a primary care doctor that  accepts your insurance, provides certain services, etc. - Physician Referral Service- [phone]  Chronic Pain Problems: Organization         Address  Phone   Notes  [facility]  [phone] Patients need to be referred by their primary care doctor.    Medication Assistance: Organization         Address  Phone   Notes  [facility] Medication [facility] [address] [place] [place], [place] [address] [phone] --Must be a resident of [facility] -- Must have NO insurance coverage whatsoever (no Medicaid/ Medicare, etc.) -- The pt. MUST have a primary care doctor that directs their care regularly and follows them in the community   [facility]ist  [phone]   [employer]  [phone]    Agencies that provide inexpensive medical care: Organization         Address  Phone   Notes  [facility]  [phone]   [facility]    [phone]   [facility] [address] [place], [place] [address] [phone]   [facility] [address] [place], [place] [phone]   Planned Parenthood    [phone]   [facility]    [phone]   Community Health and [facility]  [address], [place] Phone:  [phone], Fax:  [phone] Hours of Operation:  9 am -  6 pm, M-F.  Also accepts Medicaid/Medicare and self-pay.  [facility]  [address], [place] Phone: [phone], Fax: [phone]. Hours of Operation:  8:30 am - 5:30 pm, M-F.  Also accepts Medicaid and self-pay.  [facility] [place] [address] [place] Phone: [phone]   [facility] [address] Natasha Bence [place], [place] [phone], Ext. [phone] Mondays & Thursdays: 7-9 AM.  First 15 patients are seen on a first come, first serve basis.    Medicaid-accepting [facility] Providers:  Organization         Address  Phone   Notes  [facility] [address], [place] [phone] Also accepts self-pay patients.  [facility] [address] Laurell Josephs [place], [place]  [phone]   [facility] [address], [place] [phone]   [facility] [address] [place] [phone]   Renaye Rakers [address], [place]   [phone] Only accepts [place] Access [place] patients after they have their name applied to their card.   Self-Pay (no insurance) in [facility]:  Organization         Address  Phone   Notes  Sickle Cell Patients, [facility] Internal Medicine [address] [place], [place] [phone]   [facility] Urgent Care [address] [place], [place] [phone]   [facility] [place]  [address], [place] [phone]   [facility]/Dr. Osei-Bonsu  [address] [place] or [address], [place] [phone] Phone number for both [place] and [place] locations is the same.  Urgent Medical and [facility] [address], [place] [phone]   [facility] [address] [place] or [address] [phone]   [facility] [address] [place] [phone], phone; [phone], fax Sees patients 1st and 3rd Saturday of every month.  Must not qualify for public or private insurance (i.e. Medicaid, Medicare, [place] Health Choice, Veterans' Benefits)  Household income should be no more than 200% of the poverty level The clinic cannot treat you if you are pregnant or think you are pregnant  Sexually transmitted diseases are not treated at the clinic.    Dental Care: Organization         Address  Phone  Notes  [facility] Department of [facility] [address] [place], [place] [phone] Accepts children up to age 37 who are enrolled in [place] or [place] Health Choice; pregnant women with a Medicaid card; and children who have applied for Medicaid or [place] Health Choice, but were declined, whose parents can pay a reduced fee at time of service.   [facility] Department of [facility]  [address], [place] [phone] Accepts children up to age 4 who are enrolled in [place] or [place] Health Choice; pregnant women with a Medicaid card; and children who have applied for Medicaid or [place] Health Choice, but were declined, whose parents can pay a reduced fee at time of service.  [facility]  [address] [place], [place] [phone] Patients are seen by appointment only. Walk-ins are not accepted. [facility] will see patients 12 years of age and  older. Monday - Tuesday (8am-5pm) Most Wednesdays (8:30-5pm) $30 per visit, cash only  [facility] Adult [employer] PROGRAM  [address], [facility] [phone] Patients are seen by appointment only. Walk-ins are not accepted. [facility] will see patients 34 years of age and older. One Wednesday Evening (Monthly: Volunteer Based).  $30 per visit, cash only  [employer]  [phone] for adults; Children under age 70, call Graduate Pediatric Dentistry at [phone]. Children aged 28-14, please call [phone] to request a pediatric application.  Dental services are provided in all areas of dental care including fillings, crowns and bridges, complete and partial dentures, implants, gum treatment, root canals, and extractions. Preventive care is also provided. Treatment is provided to both adults and children. Patients are selected via a lottery and there is often a waiting list.   [facility] [address] [place]  [phone] [email or web address]   [facility] [address] [place], [place] [phone], Ext. [phone] Second and Fourth Thursday of each month, opens at 6:30 AM; Clinic ends at 9 AM.  Patients are seen on a first-come first-served basis, and a limited number are seen during each clinic.   [facility]  [address] Ether Griffins [place], [place] [phone]   Eligibility Requirements You must have lived in [place], [place], or [place] counties for at least the last three months.   You cannot be eligible for state or federal sponsored [place], including [employer], [place], or [employer].   You generally cannot be eligible for healthcare insurance through your employer.    How to apply: Eligibility screenings are held every Tuesday and Wednesday afternoon from 1:00 pm until 4:00 pm. You do not need an appointment for the interview!  [facility] [address] [place], [place] [phone]   [facility] Health Department  [phone]   [facility] Health Department  [phone]   [facility] Health Department  [phone]    Behavioral Health Resources in the Community: Intensive Outpatient Programs Organization         Address  Phone  Notes  [facility] [address], [place], [place] [phone]   [facility] Outpatient [address], [place], [place] [phone]   [facility] [address], [place], [place]  [phone]   [facility] Mental Health [address]  [place], [place] [phone] or [phone]   Substance Abuse Resources Organization         Address  Phone  Notes  [facility]  [phone]   [facility]  [phone]   [facility] [place]  [phone]   Floydene Flock  [phone]   Residential & Outpatient Substance Abuse Program  [phone]   Psychological Services Organization         Address  Phone  Notes  [facility] Behavioral Health  [phone]   [facility]  [phone]   [facility] Mental Health [address] [place] [phone] or [phone]    Mobile Crisis Teams Organization         Address  Phone  Notes  [facility], Mobile Crisis Care Unit  [phone]   [facility]  [address] [place], [place] [phone]   [facility] [address] [place] [phone]    Self-Help/Support Groups Organization         Address  Phone             Notes  [facility] [place] - variety of support groups  336- I7437963 Call for more information  Narcotics Anonymous (NA), [facility] [address], [employer] [place]  2 meetings at this location   Statistician         Address  Phone  Notes  [facility] [address],    [place]  [phone]   [facility]  [address], [place] [id number], [place], [place] [phone]   [facility] [address] [place], [place] [phone] Admissions: 8am-3pm M-F  [facility] [address],    [place], [place] [phone]   [facility] [address] [place], [place], [place] [phone]   [facility] [address],  [place], [place] [phone]   [facility] - Intensive Outpatient [address] Laurell Josephs [address], [place], [place] [phone]   [facility] ([facility] [address] [place]  [place], [place] [phone] or [phone]   [facility] ([facility]) [address], [place], [place] [phone] Accepts Medicaid  [facility] [address]  [place] [phone] Substance Abuse/Addiction Treatment   [facility] Organization         Address  Phone  Notes  [facility]  [phone]   Angie Fava, PhD [address] Ervin Knack [place], [place]   [phone] or [phone]   [facility]   [address] [place], [place] [phone]   [facility] [address], [place], [place] [phone] Insurance/Medicaid/sponsorship through [facility] and Families [address]                                    [place], [place] [phone]  Therapy/tele-psych/case  [facility] [address], [place] [phone]    Dr. Lolly Mustache  [phone]   [facility]  [employer] [facility] Dept. 1) [address], [place] 2) [address] [place] 3)  [address], [place] [phone]  [phone]   [facility] Child Abuse Hotline 219-271-4016 or [phone] (After Hours)

## 2014-11-26 NOTE — ED Notes (Signed)
Pt served IVC papers, EDP explained to family that pt could not be admitted as she was medically stable and not expressing harm to herself or others. Daughters no satisfied with this, but accepted explanation as given by nurse, EDP and RCPD. Pt left with DC paperwork, AO&ambulatory. No concerns voiced by patient.

## 2015-05-13 ENCOUNTER — Emergency Department (HOSPITAL_COMMUNITY): Payer: Self-pay

## 2015-05-13 ENCOUNTER — Encounter (HOSPITAL_COMMUNITY): Payer: Self-pay | Admitting: Emergency Medicine

## 2015-05-13 ENCOUNTER — Emergency Department (HOSPITAL_COMMUNITY)
Admission: EM | Admit: 2015-05-13 | Discharge: 2015-05-13 | Disposition: A | Discharge status: Home / Self Care | Payer: Self-pay | Attending: Emergency Medicine | Admitting: Emergency Medicine

## 2015-05-13 DIAGNOSIS — R040 Epistaxis: Secondary | ICD-10-CM | POA: Insufficient documentation

## 2015-05-13 DIAGNOSIS — F17210 Nicotine dependence, cigarettes, uncomplicated: Secondary | ICD-10-CM | POA: Insufficient documentation

## 2015-05-13 DIAGNOSIS — Z79899 Other long term (current) drug therapy: Secondary | ICD-10-CM | POA: Insufficient documentation

## 2015-05-13 DIAGNOSIS — J40 Bronchitis, not specified as acute or chronic: Secondary | ICD-10-CM

## 2015-05-13 DIAGNOSIS — J441 Chronic obstructive pulmonary disease with (acute) exacerbation: Secondary | ICD-10-CM | POA: Insufficient documentation

## 2015-05-13 DIAGNOSIS — I10 Essential (primary) hypertension: Secondary | ICD-10-CM | POA: Insufficient documentation

## 2015-05-13 DIAGNOSIS — E119 Type 2 diabetes mellitus without complications: Secondary | ICD-10-CM | POA: Insufficient documentation

## 2015-05-13 MED ORDER — AZITHROMYCIN 250 MG PO TABS: 250.0000 mg | ORAL_TABLET | Freq: Every day | ORAL | Status: DC

## 2015-05-13 NOTE — ED Notes (Signed)
Pt states that she has had a sore throat with cough for the past few days.  Denies fever or SOB.

## 2015-05-13 NOTE — Discharge Instructions (Signed)

## 2015-05-13 NOTE — ED Provider Notes (Signed)
CSN: [id number]     Arrival date & time 05/13/15  1124 History  By signing my name below, I, Gwenyth Ober, attest that this documentation has been prepared under the direction and in the presence of Ok Edwards, PA-C.  Electronically Signed: Gwenyth Ober, ED Scribe. 05/13/2015. 12:33 PM.   Chief Complaint  Patient presents with  . Sore Throat  . Cough   The history is provided by the patient. No language interpreter was used.    HPI Comments: Gloria Santana is a 59 y.o. female with a history of COPD and emphysema who presents to the Emergency Department complaining of intermittent, moderate cough that started 3 days ago. She states sore throat, 2 episodes of epistaxis and intermittent SOB as associated symptoms. Pt smokes cigarettes. She denies fever.   No PCP Past Medical History  Diagnosis Date  . COPD (chronic obstructive pulmonary disease) (HCC)   . Poor circulation   . Alcohol dependence (HCC)   . Hypertension   . Diabetes mellitus without complication (HCC)     Pt states she is not diabetic   Past Surgical History  Procedure Laterality Date  . Abdominal hysterectomy     Family History  Problem Relation Age of Onset  . Diabetes Mother    Social History  Substance Use Topics  . Smoking status: Current Every Day Smoker -- 1.00 packs/day    Types: Cigarettes  . Smokeless tobacco: None  . Alcohol Use: 2.4 - 3.6 oz/week    4-6 Cans of beer per week     Comment: 4-6 40 oz beers daily. Family states pt drinks all day everyday. (back to back)   OB History    No data available     Review of Systems  Constitutional: Negative for fever.  HENT: Positive for nosebleeds and sore throat.   Respiratory: Positive for cough and shortness of breath.   All other systems reviewed and are negative.  Allergies  Review of patient's allergies indicates no known allergies.  Home Medications   Prior to Admission medications   Medication Sig Start Date End Date  Taking? Authorizing Provider  aspirin-sod bicarb-citric acid (ALKA-SELTZER) 325 MG TBEF tablet Take 650 mg by mouth every 6 (six) hours as needed (cold).   Yes Historical Provider, MD  ibuprofen (ADVIL,MOTRIN) 200 MG tablet Take 400 mg by mouth every 6 (six) hours as needed for headache.   Yes Historical Provider, MD  Multiple Vitamin (MULTIVITAMIN WITH MINERALS) TABS tablet Take 1 tablet by mouth daily.   Yes Historical Provider, MD  thiamine (VITAMIN B-1) 100 MG tablet Take 1 tablet (100 mg total) by mouth daily. 11/21/14  Yes Kristen N Ward, DO   BP 150/94 mmHg  Pulse 88  Temp(Src) 97.8 F (36.6 C) (Oral)  Resp 18  Ht  (1.702 m)  Wt 120 lb (54.432 kg)  BMI 18.79 kg/m2  SpO2 100% Physical Exam  Constitutional: She appears well-developed and well-nourished. No distress.  HENT:  Head: Normocephalic and atraumatic.  Eyes: Conjunctivae and EOM are normal.  Neck: Neck supple. No tracheal deviation present.  Cardiovascular: Normal rate.   Pulmonary/Chest: Effort normal. No respiratory distress.  Decreased breath sounds all lobes  Skin: Skin is warm and dry.  Psychiatric: She has a normal mood and affect. Her behavior is normal.  Nursing note and vitals reviewed.   ED Course  Procedures  DIAGNOSTIC STUDIES: Oxygen Saturation is 100% on RA, normal by my interpretation.    COORDINATION OF CARE: 12:34  PM Discussed treatment plan with pt at bedside and pt agreed to plan.   Labs Review Labs Reviewed - No data to display  Imaging Review Dg Chest 1 View  05/13/2015  CLINICAL DATA:  Possible nipple markers on radiograph. EXAM: CHEST 1 VIEW COMPARISON:  Earlier today FINDINGS: 1303 hours. The nodular densities projecting over the mid lungs on the frontal radiograph corresponded nipple shadows. No other significant change. IMPRESSION: Hyperinflation, without acute disease. Electronically Signed   By: Jeronimo GreavesKyle  Talbot M.D.   On: 05/13/2015 13:46   Dg Chest 2 View  05/13/2015  CLINICAL  DATA:  PRODUCTIVE COUGH AND CONGESTION X 2 WEEKS, COPD, EMPHYSEMA, SMOKER 1 PK/DAY, HTN EXAM: CHEST  2 VIEW COMPARISON:  11/26/2014 FINDINGS: Mild hyperinflation. Remote left rib trauma. Midline trachea. Normal heart size. Mildly tortuous thoracic aorta. No pleural effusion or pneumothorax. Suspect bilateral nipple shadows. Calcified granuloma within the medial left upper lobe, only apparent on the lateral view. No lobar consolidation. IMPRESSION: Hyperinflation, without acute disease. Probable bilateral nipple shadows. Repeat frontal radiograph with nipple markers could confirm. Electronically Signed   By: Jeronimo GreavesKyle  Talbot M.D.   On: 05/13/2015 12:37   I have personally reviewed and evaluated these images as part of my medical decision-making.   EKG Interpretation None      MDM   Final diagnoses:  Bronchitis    zithromax See your Physician for recheck in 3-4 days if symptoms persist   Elson AreasLeslie K Sofia, PA-C 05/13/15 1633  [address] DriveLeslie K LockridgeSofia, PA-C 05/13/15 1633  Benjiman CoreNathan Pickering, MD 05/14/15 [phone]

## 2016-09-06 ENCOUNTER — Emergency Department (HOSPITAL_COMMUNITY)
Admission: EM | Admit: 2016-09-06 | Discharge: 2016-09-06 | Disposition: A | Discharge status: Home / Self Care | Payer: Self-pay | Attending: Emergency Medicine | Admitting: Emergency Medicine

## 2016-09-06 ENCOUNTER — Encounter (HOSPITAL_COMMUNITY): Payer: Self-pay | Admitting: Emergency Medicine

## 2016-09-06 DIAGNOSIS — I10 Essential (primary) hypertension: Secondary | ICD-10-CM | POA: Insufficient documentation

## 2016-09-06 DIAGNOSIS — R040 Epistaxis: Secondary | ICD-10-CM | POA: Insufficient documentation

## 2016-09-06 DIAGNOSIS — Z79899 Other long term (current) drug therapy: Secondary | ICD-10-CM | POA: Insufficient documentation

## 2016-09-06 DIAGNOSIS — J449 Chronic obstructive pulmonary disease, unspecified: Secondary | ICD-10-CM | POA: Insufficient documentation

## 2016-09-06 DIAGNOSIS — F17210 Nicotine dependence, cigarettes, uncomplicated: Secondary | ICD-10-CM | POA: Insufficient documentation

## 2016-09-06 DIAGNOSIS — E119 Type 2 diabetes mellitus without complications: Secondary | ICD-10-CM | POA: Insufficient documentation

## 2016-09-06 NOTE — ED Notes (Signed)
Pt reports that she smmokes, drinks and has ahd 4 bouts of epistaxis from her L nares this week- per family she bleeds a lot  She is not bleeding currently

## 2016-09-06 NOTE — ED Notes (Signed)
Patient given discharge instruction, verbalized understand. Patient ambulatory out of the department.  

## 2016-09-06 NOTE — ED Provider Notes (Signed)
[facility] Provider Note   CSN: [id number] Arrival date & time: 09/06/16  2127 By signing my name below, I, Bridgette Habermann, attest that this documentation has been prepared under the direction and in the presence of Azalia Bilis, MD. Electronically Signed: Bridgette Habermann, ED Scribe. 09/06/16. 11:33 PM.  History   Chief Complaint Chief Complaint  Patient presents with  . Epistaxis    HPI The history is provided by the patient. No language interpreter was used.   HPI Comments: Gloria Santana is a 61 y.o. female with h/o DM, HTN, COPD, and alcohol dependence, who presents to the Emergency Department complaining of intermittent episodes of epistaxis from left nare onset 3 days ago. Pt denies any recent injury or trauma to the nose. She states her episodes seem to resolve when placing pressure on her nose. Denies h/o similar symptoms. Pt is not on blood thinners. Pt denies fever, chills, or any other associated symptoms.  Past Medical History:  Diagnosis Date  . Alcohol dependence (HCC)   . COPD (chronic obstructive pulmonary disease) (HCC)   . Diabetes mellitus without complication (HCC)    Pt states she is not diabetic  . Hypertension   . Poor circulation     There are no active problems to display for this patient.   Past Surgical History:  Procedure Laterality Date  . ABDOMINAL HYSTERECTOMY      OB History    No data available       Home Medications    Prior to Admission medications   Medication Sig Start Date End Date Taking? Authorizing Provider  aspirin-sod bicarb-citric acid (ALKA-SELTZER) 325 MG TBEF tablet Take 650 mg by mouth every 6 (six) hours as needed (cold).    Historical Provider, MD  azithromycin (ZITHROMAX) 250 MG tablet Take 1 tablet (250 mg total) by mouth daily. Take first 2 tablets together, then 1 every day until finished. 05/13/15   Elson Areas, PA-C  ibuprofen (ADVIL,MOTRIN) 200 MG tablet Take 400 mg by mouth every 6 (six) hours as needed for  headache.    Historical Provider, MD  Multiple Vitamin (MULTIVITAMIN WITH MINERALS) TABS tablet Take 1 tablet by mouth daily.    Historical Provider, MD  thiamine (VITAMIN B-1) 100 MG tablet Take 1 tablet (100 mg total) by mouth daily. 11/21/14   Layla Maw Ward, DO    Family History Family History  Problem Relation Age of Onset  . Diabetes Mother     Social History Social History  Substance Use Topics  . Smoking status: Current Every Day Smoker    Packs/day: 1.00    Types: Cigarettes  . Smokeless tobacco: Never Used  . Alcohol use 2.4 - 3.6 oz/week    4 - 6 Cans of beer per week     Comment: 4-6 40 oz beers daily. Family states pt drinks all day everyday. (back to back)     Allergies   Patient has no known allergies.   Review of Systems Review of Systems 10 Systems reviewed and all are negative for acute change except as noted in the HPI. Physical Exam Updated Vital Signs BP (!) 144/94 (BP Location: Left Arm)   Pulse (!) 102   Temp 100.1 F (37.8 C) (Temporal)   Resp 20   Ht 5\' 7"  (1.702 m)   Wt 120 lb (54.4 kg)   SpO2 98%   BMI 18.79 kg/m   Physical Exam  Constitutional: She is oriented to person, place, and time. She appears well-developed  and well-nourished.  HENT:  Head: Normocephalic.  Stigmata of recent bleed in left nare. No active bleeding now.  Eyes: EOM are normal.  Neck: Normal range of motion.  Pulmonary/Chest: Effort normal.  Abdominal: She exhibits no distension.  Musculoskeletal: Normal range of motion.  Neurological: She is alert and oriented to person, place, and time.  Psychiatric: She has a normal mood and affect.  Nursing note and vitals reviewed.    ED Treatments / Results  DIAGNOSTIC STUDIES: Oxygen Saturation is 98% on RA, normal by my interpretation.   COORDINATION OF CARE: 11:33 PM-Discussed next steps with pt. Pt verbalized understanding and is agreeable with the plan.   Labs (all labs ordered are listed, but only abnormal  results are displayed) Labs Reviewed - No data to display  EKG  EKG Interpretation None       Radiology No results found.  Procedures Procedures (including critical care time)  Medications Ordered in ED Medications - No data to display   Initial Impression / Assessment and Plan / ED Course  I have reviewed the triage vital signs and the nursing notes.  Pertinent labs & imaging results that were available during my care of the patient were reviewed by me and considered in my medical decision making (see chart for details).     No active bleeding at this time.  No use of anticoagulants.  No indication for additional management or treatment here in the emergency department.  Epistaxis instructions given.  Patient understands return to the ER for new or worsening symptoms  Final Clinical Impressions(s) / ED Diagnoses   Final diagnoses:  Epistaxis    New Prescriptions New Prescriptions   No medications on file   I personally performed the services described in this documentation, which was scribed in my presence. The recorded information has been reviewed and is accurate.        Azalia BilisKevin Tonita Bills, MD 09/07/16 [phone]

## 2016-09-06 NOTE — ED Notes (Signed)
MD at the bedside to educate patient and family on nose bleedings. No active bleeding while in the room

## 2016-09-06 NOTE — ED Triage Notes (Signed)
Pt states this is the 4th time this week having nose bleeds all on the left side,  No other symptom

## 2016-09-12 ENCOUNTER — Encounter (HOSPITAL_COMMUNITY): Payer: Self-pay | Admitting: Emergency Medicine

## 2016-09-12 ENCOUNTER — Emergency Department (HOSPITAL_COMMUNITY)
Admission: EM | Admit: 2016-09-12 | Discharge: 2016-09-12 | Disposition: A | Discharge status: Home / Self Care | Payer: Self-pay | Attending: Emergency Medicine | Admitting: Emergency Medicine

## 2016-09-12 DIAGNOSIS — F10120 Alcohol abuse with intoxication, uncomplicated: Secondary | ICD-10-CM | POA: Insufficient documentation

## 2016-09-12 DIAGNOSIS — F10920 Alcohol use, unspecified with intoxication, uncomplicated: Secondary | ICD-10-CM

## 2016-09-12 DIAGNOSIS — J449 Chronic obstructive pulmonary disease, unspecified: Secondary | ICD-10-CM | POA: Insufficient documentation

## 2016-09-12 DIAGNOSIS — I10 Essential (primary) hypertension: Secondary | ICD-10-CM | POA: Insufficient documentation

## 2016-09-12 DIAGNOSIS — Z79899 Other long term (current) drug therapy: Secondary | ICD-10-CM | POA: Insufficient documentation

## 2016-09-12 DIAGNOSIS — E119 Type 2 diabetes mellitus without complications: Secondary | ICD-10-CM | POA: Insufficient documentation

## 2016-09-12 DIAGNOSIS — F17210 Nicotine dependence, cigarettes, uncomplicated: Secondary | ICD-10-CM | POA: Insufficient documentation

## 2016-09-12 DIAGNOSIS — E871 Hypo-osmolality and hyponatremia: Secondary | ICD-10-CM | POA: Insufficient documentation

## 2016-09-12 DIAGNOSIS — R040 Epistaxis: Secondary | ICD-10-CM | POA: Insufficient documentation

## 2016-09-12 LAB — COMPREHENSIVE METABOLIC PANEL
ALK PHOS: 72 U/L (ref 38–126)
ALT: 48 U/L (ref 14–54)
ANION GAP: 8 (ref 5–15)
AST: 86 U/L — ABNORMAL HIGH (ref 15–41)
Albumin: 4 g/dL (ref 3.5–5.0)
BUN: <5 mg/dL — ABNORMAL LOW (ref 6–20)
CALCIUM: 8.8 mg/dL — AB (ref 8.9–10.3)
CO2: 25 mmol/L (ref 22–32)
CREATININE: 0.52 mg/dL (ref 0.44–1.00)
Chloride: 93 mmol/L — ABNORMAL LOW (ref 101–111)
GFR calc non Af Amer: >60 mL/min (ref >60)
Glucose, Bld: 88 mg/dL (ref 65–99)
Potassium: 4 mmol/L (ref 3.5–5.1)
Sodium: 126 mmol/L — ABNORMAL LOW (ref 135–145)
Total Bilirubin: 0.4 mg/dL (ref 0.3–1.2)
Total Protein: 7.1 g/dL (ref 6.5–8.1)

## 2016-09-12 LAB — RAPID URINE DRUG SCREEN, HOSP PERFORMED
AMPHETAMINES: NOT DETECTED
Barbiturates: NOT DETECTED
Benzodiazepines: NOT DETECTED
Cocaine: NOT DETECTED
OPIATES: NOT DETECTED
Tetrahydrocannabinol: NOT DETECTED

## 2016-09-12 LAB — URINALYSIS, ROUTINE W REFLEX MICROSCOPIC
Bilirubin Urine: NEGATIVE
Glucose, UA: NEGATIVE mg/dL
Hgb urine dipstick: NEGATIVE
Ketones, ur: NEGATIVE mg/dL
LEUKOCYTES UA: NEGATIVE
NITRITE: NEGATIVE
PROTEIN: NEGATIVE mg/dL
Specific Gravity, Urine: 1.003 — ABNORMAL LOW (ref 1.005–1.030)
pH: 5 (ref 5.0–8.0)

## 2016-09-12 LAB — CBC
HEMATOCRIT: 33.9 % — AB (ref 36.0–46.0)
Hemoglobin: 11.9 g/dL — ABNORMAL LOW (ref 12.0–15.0)
MCH: 30.6 pg (ref 26.0–34.0)
MCHC: 35.1 g/dL (ref 30.0–36.0)
MCV: 87.1 fL (ref 78.0–100.0)
PLATELETS: 193 10*3/uL (ref 150–400)
RBC: 3.89 MIL/uL (ref 3.87–5.11)
RDW: 12.8 % (ref 11.5–15.5)
WBC: 3.2 10*3/uL — ABNORMAL LOW (ref 4.0–10.5)

## 2016-09-12 LAB — PROTIME-INR
INR: 1.03
Prothrombin Time: 13.5 seconds (ref 11.4–15.2)

## 2016-09-12 LAB — ETHANOL: Alcohol, Ethyl (B): 372 mg/dL (ref <5)

## 2016-09-12 MED ORDER — SODIUM CHLORIDE 0.9 % IV BOLUS (SEPSIS)
1000.0000 mL | Freq: Once | INTRAVENOUS | Status: AC
  Administered 2016-09-12: 1000 mL via INTRAVENOUS

## 2016-09-12 NOTE — ED Triage Notes (Signed)
Per EMS they were called out for a nosebleed, when they arrived it was 50-100 ml of blood on the floor.  Family stated that she was coughing up blood, ems states that it was blood drainage.  Pt states that the nosebleeds has been happening once a day for the past seven days.  Pt had two beers today.    176/86 --->diastolic 90s, 137/100. CBG 285 O2 saturation 97%

## 2016-09-12 NOTE — Discharge Instructions (Signed)
Take your usual prescriptions as previously directed.  If you have a nosebleed:  Blow your nose gently, then lean your head forward and pinch both of your nostrils firmly and continuously for at least 15 to 20 minutes.  Call your regular medical doctor and the ENT doctor on Monday to schedule a follow up appointment this week.  Return to the Emergency Department immediately sooner if worsening.

## 2016-09-12 NOTE — ED Provider Notes (Signed)
[facility] Provider Note   CSN: [id number] Arrival date & time: 09/12/16  1709     History   Chief Complaint Chief Complaint  Patient presents with  . Epistaxis    HPI Gloria Santana is a 61 y.o. female.  The history is provided by the patient and a relative. The history is limited by the condition of the patient (intoxicated).    Pt was seen at 1740. Per pt and her family, c/o gradual onset and persistence of multiple intermittent episodes of epistaxis for the past 1 week. Family also states pt "is really weak." Pt has hx heavy daily etoh use. Pt denies falling, no injury to face, no abd pain, no N/V/D, no CP/SOB, no syncope, no focal motor weakness, no tingling/numbness in extremities.   Past Medical History:  Diagnosis Date  . Alcohol dependence (HCC)   . COPD (chronic obstructive pulmonary disease) (HCC)   . Diabetes mellitus without complication (HCC)    Pt states she is not diabetic  . Hypertension   . Poor circulation     There are no active problems to display for this patient.   Past Surgical History:  Procedure Laterality Date  . ABDOMINAL HYSTERECTOMY      OB History    No data available       Home Medications    Prior to Admission medications   Medication Sig Start Date End Date Taking? Authorizing Provider  aspirin-sod bicarb-citric acid (ALKA-SELTZER) 325 MG TBEF tablet Take 650 mg by mouth every 6 (six) hours as needed (cold).    Historical Provider, MD  azithromycin (ZITHROMAX) 250 MG tablet Take 1 tablet (250 mg total) by mouth daily. Take first 2 tablets together, then 1 every day until finished. 05/13/15   Elson Areas, PA-C  ibuprofen (ADVIL,MOTRIN) 200 MG tablet Take 400 mg by mouth every 6 (six) hours as needed for headache.    Historical Provider, MD  Multiple Vitamin (MULTIVITAMIN WITH MINERALS) TABS tablet Take 1 tablet by mouth daily.    Historical Provider, MD  thiamine (VITAMIN B-1) 100 MG tablet Take 1 tablet (100 mg  total) by mouth daily. 11/21/14   Layla Maw Ward, DO    Family History Family History  Problem Relation Age of Onset  . Diabetes Mother     Social History Social History  Substance Use Topics  . Smoking status: Current Every Day Smoker    Packs/day: 1.00    Types: Cigarettes  . Smokeless tobacco: Never Used  . Alcohol use 2.4 - 3.6 oz/week    4 - 6 Cans of beer per week     Comment: 4-6 40 oz beers daily. Family states pt drinks all day everyday. (back to back)     Allergies   Patient has no known allergies.   Review of Systems Review of Systems  Unable to perform ROS: Other     Physical Exam Updated Vital Signs BP 117/87 (BP Location: Left Arm)   Pulse 84   Temp 97.9 F (36.6 C) (Oral)   Resp 18   Ht  (1.702 m)   Wt 120 lb (54.4 kg)   SpO2 98%   BMI 18.79 kg/m   19:50 Orthostatic Vital Signs HV  Orthostatic Lying   BP- Lying: 117/81  Pulse- Lying: 81      Orthostatic Sitting  BP- Sitting: 118/80  Pulse- Sitting: 88      Orthostatic Standing at 0 minutes  BP- Standing at 0 minutes: 97/77  Pulse- Standing at 0 minutes: 81    21:48 Orthostatic Vital Signs AFTER IVF HV  Orthostatic Lying   BP- Lying: 103/78  Pulse- Lying: 75      Orthostatic Sitting  BP- Sitting: 114/88  Pulse- Sitting: 78      Orthostatic Standing at 0 minutes  BP- Standing at 0 minutes:  122/97  Pulse- Standing at 0 minutes: 91     Physical Exam 1745: Physical examination:  Nursing notes reviewed; Vital signs and O2 SAT reviewed;  Constitutional: Well developed, Well nourished, Well hydrated, In no acute distress; Head:  Normocephalic, atraumatic; Eyes: EOMI, PERRL, No scleral icterus; ENMT: Mouth and pharynx normal, Mucous membranes moist. +edematous, friable nasal turbinates bilat with dried blood anteriorly. No blood in posterior pharynx..; Neck: Supple, Full range of motion, No lymphadenopathy; Cardiovascular: Regular rate and rhythm, [place]; Respiratory: Breath  sounds clear & equal bilaterally, No wheezes.  Speaking full sentences with ease, Normal respiratory effort/excursion; Chest: Nontender, Movement normal; Abdomen: Soft, Nontender, Nondistended, Normal bowel sounds; Genitourinary: No CVA tenderness; Extremities: Pulses normal, No tenderness, No edema, No calf edema or asymmetry.; Neuro: AA&Ox3, Major CN grossly intact. No facial droop. Speech slurred. No gross focal motor or sensory deficits in extremities.; Skin: Color normal, Warm, Dry.; .Psych:  Intoxicated.   ED Treatments / Results  Labs (all labs ordered are listed, but only abnormal results are displayed)   EKG  EKG Interpretation None       Radiology   Procedures Procedures (including critical care time)  Medications Ordered in ED Medications - No data to display   Initial Impression / Assessment and Plan / ED Course  I have reviewed the triage vital signs and the nursing notes.  Pertinent labs & imaging results that were available during my care of the patient were reviewed by me and considered in my medical decision making (see chart for details).  MDM Reviewed: previous chart, nursing note and vitals Reviewed previous: labs Interpretation: labs   Results for orders placed or performed during the hospital encounter of 09/12/16  CBC  Result Value Ref Range   WBC 3.2 (L) 4.0 - 10.5 K/uL   RBC 3.89 3.87 - 5.11 MIL/uL   Hemoglobin 11.9 (L) 12.0 - 15.0 g/dL   HCT 16.1 (L) 09.6 - 04.5 %   MCV 87.1 78.0 - 100.0 fL   MCH 30.6 26.0 - 34.0 pg   MCHC 35.1 30.0 - 36.0 g/dL   RDW 40.9 81.1 - 91.4 %   Platelets 193 150 - 400 K/uL  Protime-INR  Result Value Ref Range   Prothrombin Time 13.5 11.4 - 15.2 seconds   INR 1.03   Comprehensive metabolic panel  Result Value Ref Range   Sodium 126 (L) 135 - 145 mmol/L   Potassium 4.0 3.5 - 5.1 mmol/L   Chloride 93 (L) 101 - 111 mmol/L   CO2 25 22 - 32 mmol/L   Glucose, Bld 88 65 - 99 mg/dL   BUN <5 (L) 6 - 20 mg/dL    Creatinine, Ser 7.82 0.44 - 1.00 mg/dL   Calcium 8.8 (L) 8.9 - 10.3 mg/dL   Total Protein 7.1 6.5 - 8.1 g/dL   Albumin 4.0 3.5 - 5.0 g/dL   AST 86 (H) 15 - 41 U/L   ALT 48 14 - 54 U/L   Alkaline Phosphatase 72 38 - 126 U/L   Total Bilirubin 0.4 0.3 - 1.2 mg/dL   GFR calc non Af Amer >60 >60 mL/min   GFR  calc Af Amer >60 >60 mL/min   Anion gap 8 5 - 15  Ethanol  Result Value Ref Range   Alcohol, Ethyl (B) 372 (HH) <5 mg/dL   Results for PENNE, ROSENSTOCK (MRN [id number]) as of 09/12/2016 22:44  Ref. Range 12/08/2013 10:25 02/16/2014 14:41 02/16/2014 19:30 02/17/2014 05:54 11/21/2014 21:30 11/26/2014 20:35 09/12/2016 18:27  Sodium Latest Ref Range: 135 - 145 mmol/L 128 (L) 122 (L) 129 (L) 133 (L) 131 (L) 124 (L) 126 (L)   1930:  Pt intoxicated, known hyponatremia for the past several years (likely from etoh use).  H/H per baseline. Family insistent pt "needs IV fluids because she's so weak." Will dose IVF, trial PO, ambulate, obtain orthostatic VS.   2230:  Pt has ambulated with steady gait, resps easy, NAD. Pt has tol PO well without N/V. Orthostatic VS improved after IVF bolus. No further epistaxis while in the ED. Pt acting per baseline. Multiple family members here, will take pt home. Dx and testing d/w pt and family.  Questions answered.  Verb understanding, agreeable to d/c home with outpt f/u.   Final Clinical Impressions(s) / ED Diagnoses   Final diagnoses:  None    New Prescriptions New Prescriptions   No medications on file      Samuel Jester, DO 09/15/16 1858

## 2016-09-12 NOTE — ED Notes (Signed)
Pt given coca cola to drink per fluid challenge and pt's request

## 2016-09-12 NOTE — ED Notes (Signed)
Pt ambulated to bathroom with a steady gait and minimal assistance. Much improved since first attempt at ambulation.

## 2016-09-12 NOTE — ED Notes (Signed)
Date and time results received: 09/12/16  1924 (use smartphrase ".now" to insert current time)  Test: ETOH Critical Value: 372  Name of Provider Notified: McMannus  Orders Received? Or Actions Taken?: MD notified

## 2016-09-12 NOTE — ED Notes (Signed)
Attempted to ambulate pt. Pt took a few extremely unsteady steps around the room, and this tech returned pt to her bed.

## 2018-02-06 ENCOUNTER — Emergency Department (HOSPITAL_COMMUNITY): Payer: Medicaid Other

## 2018-02-06 ENCOUNTER — Other Ambulatory Visit: Payer: Self-pay

## 2018-02-06 ENCOUNTER — Encounter (HOSPITAL_COMMUNITY): Payer: Self-pay | Admitting: Emergency Medicine

## 2018-02-06 ENCOUNTER — Emergency Department (HOSPITAL_COMMUNITY)
Admission: EM | Admit: 2018-02-06 | Discharge: 2018-02-06 | Disposition: A | Discharge status: Home / Self Care | Payer: Medicaid Other | Attending: Emergency Medicine | Admitting: Emergency Medicine

## 2018-02-06 DIAGNOSIS — W010XXA Fall on same level from slipping, tripping and stumbling without subsequent striking against object, initial encounter: Secondary | ICD-10-CM | POA: Diagnosis not present

## 2018-02-06 DIAGNOSIS — I10 Essential (primary) hypertension: Secondary | ICD-10-CM | POA: Diagnosis not present

## 2018-02-06 DIAGNOSIS — Y929 Unspecified place or not applicable: Secondary | ICD-10-CM | POA: Diagnosis not present

## 2018-02-06 DIAGNOSIS — Y939 Activity, unspecified: Secondary | ICD-10-CM | POA: Insufficient documentation

## 2018-02-06 DIAGNOSIS — F17210 Nicotine dependence, cigarettes, uncomplicated: Secondary | ICD-10-CM | POA: Insufficient documentation

## 2018-02-06 DIAGNOSIS — E119 Type 2 diabetes mellitus without complications: Secondary | ICD-10-CM | POA: Diagnosis not present

## 2018-02-06 DIAGNOSIS — Y998 Other external cause status: Secondary | ICD-10-CM | POA: Diagnosis not present

## 2018-02-06 DIAGNOSIS — S42002A Fracture of unspecified part of left clavicle, initial encounter for closed fracture: Secondary | ICD-10-CM | POA: Diagnosis not present

## 2018-02-06 DIAGNOSIS — J449 Chronic obstructive pulmonary disease, unspecified: Secondary | ICD-10-CM | POA: Insufficient documentation

## 2018-02-06 DIAGNOSIS — S4992XA Unspecified injury of left shoulder and upper arm, initial encounter: Secondary | ICD-10-CM | POA: Diagnosis present

## 2018-02-06 MED ORDER — MELOXICAM 7.5 MG PO TABS: 7.5000 mg | ORAL_TABLET | Freq: Every day | ORAL | 0 refills | Status: DC

## 2018-02-06 MED ORDER — ONDANSETRON HCL 4 MG PO TABS
4.0000 mg | ORAL_TABLET | Freq: Once | ORAL | Status: AC
  Administered 2018-02-06: 4 mg via ORAL
  Filled 2018-02-06: qty 1

## 2018-02-06 MED ORDER — HYDROCODONE-ACETAMINOPHEN 5-325 MG PO TABS
2.0000 | ORAL_TABLET | Freq: Once | ORAL | Status: AC
  Administered 2018-02-06: 2 via ORAL
  Filled 2018-02-06: qty 2

## 2018-02-06 MED ORDER — HYDROCODONE-ACETAMINOPHEN 5-325 MG PO TABS: 1.0000 | ORAL_TABLET | ORAL | 0 refills | Status: DC | PRN

## 2018-02-06 MED ORDER — IBUPROFEN 400 MG PO TABS
400.0000 mg | ORAL_TABLET | Freq: Once | ORAL | Status: AC
  Administered 2018-02-06: 400 mg via ORAL
  Filled 2018-02-06: qty 1

## 2018-02-06 NOTE — ED Provider Notes (Signed)
[facility] EMERGENCY DEPARTMENT Provider Note   CSN: [id number] Arrival date & time: 02/06/18  1501     History   Chief Complaint Chief Complaint  Patient presents with  . Fall    HPI Gloria Santana is a 62 y.o. female.  Patient is a 62 year old female who presents to the emergency department with a complaint of shoulder pain following a fall.  The patient and family state that the patient had a fall on last night.  The patient states that she stumbled over her feet and fell.  She injured the left arm and collarbone area.  The patient denies any other injury at this time.  She has not had any unusual numbness or tingling of the left upper extremity.  She states that she cannot move the shoulder without severe pain.  Patient denies any recent operations or procedures involving the left upper extremity.  Patient denies being on any anticoagulation medications.  She presents now for assistance with this issue.  The history is provided by the patient and a relative.    Past Medical History:  Diagnosis Date  . Alcohol dependence (HCC)   . COPD (chronic obstructive pulmonary disease) (HCC)   . Diabetes mellitus without complication (HCC)    Pt states she is not diabetic  . Hypertension   . Poor circulation     There are no active problems to display for this patient.   Past Surgical History:  Procedure Laterality Date  . ABDOMINAL HYSTERECTOMY       OB History   None      Home Medications    Prior to Admission medications   Medication Sig Start Date End Date Taking? Authorizing Provider  ibuprofen (ADVIL,MOTRIN) 200 MG tablet Take 400 mg by mouth every 6 (six) hours as needed for headache.    [provider]    Family History Family History  Problem Relation Age of Onset  . Diabetes Mother     Social History Social History   Tobacco Use  . Smoking status: Current Every Day Smoker    Packs/day: 2.00    Types: Cigarettes  . Smokeless tobacco:  Never Used  Substance Use Topics  . Alcohol use: Yes    Alcohol/week: 4.0 - 6.0 standard drinks    Types: 4 - 6 Cans of beer per week    Comment: 4-6 40 oz beers daily. Family states pt drinks all day everyday. (back to back)  . Drug use: No     Allergies   Patient has no known allergies.   Review of Systems Review of Systems  Constitutional: Negative for activity change.       All ROS Neg except as noted in HPI  HENT: Negative for nosebleeds.   Eyes: Negative for photophobia and discharge.  Respiratory: Negative for cough, shortness of breath and wheezing.   Cardiovascular: Negative for chest pain and palpitations.  Gastrointestinal: Negative for abdominal pain and blood in stool.  Genitourinary: Negative for dysuria, frequency and hematuria.  Musculoskeletal: Positive for arthralgias. Negative for back pain and neck pain.       Shoulder and clavicle pain  Skin: Negative.   Neurological: Negative for dizziness, seizures and speech difficulty.  Psychiatric/Behavioral: Negative for confusion and hallucinations.     Physical Exam Updated Vital Signs BP (!) 142/97 (BP Location: Right Arm)   Pulse 93   Temp 98.9 F (37.2 C) (Temporal)   Resp 20   Ht 5\' 8"  (1.727 m)   Wt  49.4 kg   SpO2 100%   BMI 16.57 kg/m   Physical Exam  Constitutional: She is oriented to person, place, and time. She appears well-developed and well-nourished.  Non-toxic appearance.  HENT:  Head: Normocephalic.  Right Ear: Tympanic membrane and external ear normal.  Left Ear: Tympanic membrane and external ear normal.  Eyes: Pupils are equal, round, and reactive to light. EOM and lids are normal.  Neck: Normal range of motion. Neck supple. Carotid bruit is not present.  Cardiovascular: Normal rate, regular rhythm, normal heart sounds, intact distal pulses and normal pulses.  Pulmonary/Chest: Breath sounds normal. No respiratory distress.  There is symmetrical rise and fall of the chest.  The  patient speaks in complete sentences without problem.  Abdominal: Soft. Bowel sounds are normal. There is no tenderness. There is no guarding.  Musculoskeletal:       Left shoulder: She exhibits decreased range of motion, bony tenderness and pain.  Capillary refill on the left is less than 3 seconds.  Radial pulse is 2+.  There is no deformity of the left hand, wrist, or elbow.  There is pain of the left shoulder near the [facility] joint area.  There is pain of the clavicle at the distal portion.  The patient has pain with even minimal attempted movement of the shoulder.  There is full range of motion of the right shoulder, elbow, wrist, and fingers.  No pelvis or lower extremity pain with change of position or walking.  Lymphadenopathy:       Head (right side): No submandibular adenopathy present.       Head (left side): No submandibular adenopathy present.    She has no cervical adenopathy.  Neurological: She is alert and oriented to person, place, and time. She has normal strength. No cranial nerve deficit or sensory deficit.  Skin: Skin is warm and dry.  Psychiatric: She has a normal mood and affect. Her speech is normal.  Nursing note and vitals reviewed.    ED Treatments / Results  Labs (all labs ordered are listed, but only abnormal results are displayed) Labs Reviewed - No data to display  EKG None  Radiology Dg Clavicle Left  Result Date: 02/06/2018 CLINICAL DATA:  Fall last night, clavicle pain. EXAM: LEFT CLAVICLE - 2+ VIEWS COMPARISON:  Plain film of the LEFT shoulder performed earlier today. FINDINGS: These additional views of the LEFT clavicle confirm a slightly displaced fracture of the distal LEFT clavicle. Alignment at the LEFT [facility] joint remains within normal limits. Humeral head appears intact and normally positioned. Soft tissues about the LEFT shoulder are unremarkable. IMPRESSION: Slightly displaced fracture of the distal LEFT clavicle. Electronically Signed   By: Bary Richard M.D.   On: 02/06/2018 16:40   Dg Shoulder Left  Result Date: 02/06/2018 CLINICAL DATA:  Left shoulder injury after fall last night. Shoulder pain with limited mobility. EXAM: LEFT SHOULDER - 2+ VIEW COMPARISON:  Report from shoulder radiographs dated 12/28/2001 FINDINGS: The humeral head appears appropriately aligned with the glenoid on the frontal and generally aligned on the transscapular view, but less definitively aligned on the axial view, which is probably projectional due to obliquity of the scapula. Questionable distal clavicular fracture adjacent to the [facility] joint, dedicated clavicle views are recommended. IMPRESSION: 1. Questionable distal clavicular fracture adjacent to the [facility] joint. Dedicated clavicle views are recommended for better characterization of this region. Electronically Signed   By: Gaylyn Rong M.D.   On: 02/06/2018 15:47  Procedures Procedures (including critical care time)  FRACTURE CARE LEFT CLAVICLE  Patient sustained a fall on last evening, and injured the left shoulder and clavicle.  X-ray confirms a slightly displaced fracture of the distal left clavicle.  I have discussed the fracture with the patient and the family in terms which they understand.  They gave permission for the immobilization process.  The patient was identified by armband.  A procedural timeout was taken.  The patient was fitted with a sling.  After the sling was applied, the radial pulse is 2+, the capillary refill is less than 3 seconds.  There is no significant change in the patient's pain level.  The patient is treated in the emergency department with hydrocodone and ibuprofen.  Patient tolerated the procedure without problem.  Medications Ordered in ED Medications - No data to display   Initial Impression / Assessment and Plan / ED Course  I have reviewed the triage vital signs and the nursing notes.  Pertinent labs & imaging results that were available during my care of the  patient were reviewed by me and considered in my medical decision making (see chart for details).       Final Clinical Impressions(s) / ED Diagnoses MDM  Vital signs reviewed.  Pulse oximetry is 100% on room air.  Within normal limits by my interpretation.  The patient sustained a fall on last night, and injured the left shoulder and clavicle.  X-ray of the shoulder and clavicle suggest a slightly displaced fracture of the distal left clavicle.  The patient was fitted with a sling, and started on medication for pain and inflammation.  The patient will follow-up with Dr. Romeo AppleHarrison for orthopedic management at this point.  The patient is to return to the emergency department if any changes in the color of the hand, changes in sensation, changes in condition, problems, or concerns.  Patient and family are in agreement with this plan.   Final diagnoses:  Fracture of unspecified part of left clavicle, initial encounter for closed fracture    ED Discharge Orders         Ordered    HYDROcodone-acetaminophen (NORCO/VICODIN) 5-325 MG tablet  Every 4 hours PRN     02/06/18 1735    meloxicam (MOBIC) 7.5 MG tablet  Daily     02/06/18 1735           Ivery QualeBryant, Rashun Grattan, PA-C 02/06/18 1749    Vanetta MuldersZackowski, Scott, MD 02/13/18 [phone]

## 2018-02-06 NOTE — Discharge Instructions (Signed)
Your x-ray shows a fracture of your left clavicle near the shoulder area.  Please use your sling until seen by the orthopedic specialist.  Please see the orthopedic specialist as soon as possible.  Please use meloxicam daily with a meal.  Please use Tylenol extra strength every 4 hours for mild pain.  May use hydrocodone for more severe pain. This medication may cause drowsiness. Please do not drink, drive, or participate in activity that requires concentration while taking this medication.

## 2018-02-06 NOTE — ED Triage Notes (Signed)
Pt comes in for a fall last night and L arm and collarbone pain. States she lost her balance trying to go to the bathroom. Denies blood thinner use, LOC, or behavioral changes.

## 2018-02-15 ENCOUNTER — Telehealth: Payer: Self-pay | Admitting: Orthopedic Surgery

## 2018-02-15 NOTE — Telephone Encounter (Signed)
A gentleman called to set up appointment for [facility].  He said she broke her collarbone.  He said she went to ED on 02/06/18 which was almost a week ago.  He said she didn't have insurance.  I told him that I really needed to discuss the situation with her.  He said he would have her call our office to schedule the appointment.

## 2018-04-05 ENCOUNTER — Other Ambulatory Visit (HOSPITAL_COMMUNITY): Payer: Self-pay | Admitting: Internal Medicine

## 2018-04-05 DIAGNOSIS — Z1231 Encounter for screening mammogram for malignant neoplasm of breast: Secondary | ICD-10-CM

## 2018-04-06 ENCOUNTER — Ambulatory Visit (HOSPITAL_COMMUNITY)
Admission: RE | Admit: 2018-04-06 | Discharge: 2018-04-06 | Disposition: A | Discharge status: Home / Self Care | Payer: Medicaid Other | Source: Ambulatory Visit | Attending: Internal Medicine | Admitting: Internal Medicine

## 2018-04-06 ENCOUNTER — Other Ambulatory Visit (HOSPITAL_COMMUNITY): Payer: Self-pay | Admitting: Internal Medicine

## 2018-04-06 DIAGNOSIS — R000 Tachycardia, unspecified: Secondary | ICD-10-CM

## 2018-04-06 DIAGNOSIS — Z1231 Encounter for screening mammogram for malignant neoplasm of breast: Secondary | ICD-10-CM | POA: Diagnosis present

## 2018-04-07 ENCOUNTER — Encounter: Payer: Self-pay | Admitting: Gastroenterology

## 2018-04-07 ENCOUNTER — Ambulatory Visit (HOSPITAL_COMMUNITY): Payer: Medicaid Other | Attending: Internal Medicine

## 2018-04-22 ENCOUNTER — Other Ambulatory Visit (HOSPITAL_COMMUNITY): Payer: Self-pay | Admitting: Internal Medicine

## 2018-04-22 DIAGNOSIS — R945 Abnormal results of liver function studies: Secondary | ICD-10-CM

## 2018-05-02 ENCOUNTER — Ambulatory Visit (HOSPITAL_COMMUNITY)
Admission: RE | Admit: 2018-05-02 | Discharge: 2018-05-02 | Disposition: A | Discharge status: Home / Self Care | Payer: Medicaid Other | Source: Ambulatory Visit | Attending: Internal Medicine | Admitting: Internal Medicine

## 2018-05-06 ENCOUNTER — Ambulatory Visit (HOSPITAL_COMMUNITY)
Admission: RE | Admit: 2018-05-06 | Discharge: 2018-05-06 | Disposition: A | Discharge status: Home / Self Care | Payer: Medicaid Other | Source: Ambulatory Visit | Attending: Internal Medicine | Admitting: Internal Medicine

## 2018-05-06 DIAGNOSIS — K8020 Calculus of gallbladder without cholecystitis without obstruction: Secondary | ICD-10-CM | POA: Diagnosis not present

## 2018-05-06 DIAGNOSIS — K760 Fatty (change of) liver, not elsewhere classified: Secondary | ICD-10-CM | POA: Diagnosis not present

## 2018-05-06 DIAGNOSIS — K7689 Other specified diseases of liver: Secondary | ICD-10-CM | POA: Diagnosis not present

## 2018-05-06 DIAGNOSIS — R945 Abnormal results of liver function studies: Secondary | ICD-10-CM

## 2018-05-11 ENCOUNTER — Encounter: Payer: Self-pay | Admitting: Gastroenterology

## 2018-07-13 ENCOUNTER — Telehealth: Payer: Self-pay | Admitting: *Deleted

## 2018-07-13 ENCOUNTER — Encounter: Payer: Self-pay | Admitting: *Deleted

## 2018-07-13 ENCOUNTER — Encounter: Payer: Self-pay | Admitting: Gastroenterology

## 2018-07-13 ENCOUNTER — Ambulatory Visit (INDEPENDENT_AMBULATORY_CARE_PROVIDER_SITE_OTHER): Payer: Medicaid Other | Admitting: Gastroenterology

## 2018-07-13 ENCOUNTER — Other Ambulatory Visit: Payer: Self-pay | Admitting: *Deleted

## 2018-07-13 VITALS — BP 116/82 | HR 103 | Temp 97.5°F | Ht 67.0 in | Wt 112.6 lb

## 2018-07-13 DIAGNOSIS — K760 Fatty (change of) liver, not elsewhere classified: Secondary | ICD-10-CM

## 2018-07-13 DIAGNOSIS — Z1211 Encounter for screening for malignant neoplasm of colon: Secondary | ICD-10-CM | POA: Diagnosis not present

## 2018-07-13 MED ORDER — CLENPIQ 10-3.5-12 MG-GM -GM/160ML PO SOLN: 1.0000 | Freq: Once | ORAL | 0 refills | Status: AC

## 2018-07-13 NOTE — Progress Notes (Addendum)
REVIEWED EMR FROM 2016 TO PRESENT. [facility] TCS/EGD AFTER JUN 1 DUE TO COVID 19 RESTRICTIONS.  Primary Care Physician:  Rosita Fire, MD Primary Gastroenterologist:  Dr. Oneida Alar   Chief Complaint  Patient presents with  . Colonoscopy  . Cirrhosis    HPI:   Gloria Santana is a 63 y.o. female presenting today at the request of Dr. Legrand Rams due to concerns for cirrhosis in setting of alcohol abuse. She reports colonoscopy/EGD probably around 10 years ago. We do not have this in the system.   No significant abdominal pain. Occasional confusion. Retired now and stays at home. No mental status changes. About 4-5 months ago saw bright red blood on tissue. Mostly constipation. BM about every day, sometimes every 2-3 days. Ocasional reflux. No PPI. No dysphagia. States she doesn't believe she has lost weight. Sometimes no appetite. Rare N/V.   [place] RUQ Nov 2019 with fatty liver, question hepatomegaly. Cholelithiasis with single mobile 11 mm gallstone.   Outside labs Oct 2019: negative Hep A total antibody, negative Hep B surface antigen, negative Hep C antibody, AST 68, ALT 26, Alk Phos 141, Albumin 4.3, Tbili 1.6, indirect 1.1, Sodium 130, Hgb 13.9, platelets 172. HIV non-reactive.   Past Medical History:  Diagnosis Date  . Alcohol dependence ([place])   . COPD (chronic obstructive pulmonary disease) ([place])   . Diabetes mellitus without complication ([place])    Pt states she is not diabetic  . Hypertension   . Poor circulation     Past Surgical History:  Procedure Laterality Date  . ABDOMINAL HYSTERECTOMY      Current Outpatient Medications  Medication Sig Dispense Refill  . ibuprofen (ADVIL,MOTRIN) 200 MG tablet Take 400 mg by mouth every 6 (six) hours as needed for headache.     No current facility-administered medications for this visit.     Allergies as of 07/13/2018  . (No Known Allergies)    Family History  Problem Relation Age of Onset  . Diabetes Mother   . Colon cancer Neg Hx     . Colon polyps Neg Hx     Social History   Socioeconomic History  . Marital status: Legally Separated    Spouse name: Not on file  . Number of children: Not on file  . Years of education: Not on file  . Highest education level: Not on file  Occupational History  . Not on file  Social Needs  . Financial resource strain: Not on file  . Food insecurity:    Worry: Not on file    Inability: Not on file  . Transportation needs:    Medical: Not on file    Non-medical: Not on file  Tobacco Use  . Smoking status: Current Every Day Smoker    Packs/day: 2.00    Types: Cigarettes  . Smokeless tobacco: Never Used  Substance and Sexual Activity  . Alcohol use: Yes    Alcohol/week: 4.0 - 6.0 standard drinks    Types: 4 - 6 Cans of beer per week    Comment: 4-6 40 oz beers daily. Family states pt drinks all day everyday. (back to back). Patient states sometimes drinks more.   . Drug use: No  . Sexual activity: Yes    Birth control/protection: Surgical  Lifestyle  . Physical activity:    Days per week: Not on file    Minutes per session: Not on file  . Stress: Not on file  Relationships  . Social connections:    Talks  on phone: Not on file    Gets together: Not on file    Attends religious service: Not on file    Active member of club or organization: Not on file    Attends meetings of clubs or organizations: Not on file    Relationship status: Not on file  . Intimate partner violence:    Fear of current or ex partner: Not on file    Emotionally abused: Not on file    Physically abused: Not on file    Forced sexual activity: Not on file  Other Topics Concern  . Not on file  Social History Narrative  . Not on file    Review of Systems: Gen: Denies any fever, chills, fatigue, weight loss, lack of appetite.  CV: Denies chest pain, heart palpitations, peripheral edema, syncope.  Resp: Denies shortness of breath at rest or with exertion. Denies wheezing or cough.  GI: see  HPI GU : Denies urinary burning, urinary frequency, urinary hesitancy MS: Denies joint pain, muscle weakness, cramps, or limitation of movement.  Derm: Denies rash, itching, dry skin Psych: Denies depression, anxiety, memory loss, and confusion Heme: Denies bruising, bleeding, and enlarged lymph nodes.  Physical Exam: BP 116/82   Pulse (!) 103   Temp (!) 97.5 F (36.4 C) (Oral)   Ht _0  (1.702 m)   Wt 112 lb 9.6 oz (51.1 kg)   BMI 17.64 kg/m  General:   Alert and oriented. Pleasant and cooperative. Thin. Head:  Normocephalic and atraumatic. Eyes:  Without icterus, sclera clear and conjunctiva pink.  Ears:  Normal auditory acuity. Nose:  No deformity, discharge,  or lesions. Mouth:  No deformity or lesions, oral mucosa pink.  Lungs:  Clear to auscultation bilaterally.  Heart:  S1, S2 present without murmurs appreciated.  Abdomen:  +BS, soft, non-tender and non-distended. No HSM noted. No guarding or rebound. No masses appreciated.  Rectal:  Deferred  Msk:  Symmetrical without gross deformities. Normal posture. Extremities:  Without edema. Neurologic:  Alert and  oriented x4. Psych:  Alert and cooperative. Normal mood and affect.

## 2018-07-13 NOTE — Patient Instructions (Signed)
We have arranged a colonoscopy and possible upper endoscopy with Dr. Darrick Penna in the near future.  I have also ordered a special ultrasound of your liver.  It is very important to stop drinking for your health, as we talked about.  I will see you in 3 months or sooner if needed!  It was a pleasure to see you today. I strive to create trusting relationships with patients to provide genuine, compassionate, and quality care. I value your feedback. If you receive a survey regarding your visit,  I greatly appreciate you taking time to fill this out.   Gelene Mink, PhD, ANP-BC [facility]

## 2018-07-13 NOTE — Telephone Encounter (Signed)
PA for ultrasound was approved via [employer]'s website. Auth# [id number] dates 07/13/2018-08/12/2018

## 2018-07-14 ENCOUNTER — Telehealth: Payer: Self-pay | Admitting: *Deleted

## 2018-07-14 NOTE — Telephone Encounter (Signed)
Pre-op scheduled for 09/28/2018 at 11:00am. Letter mailed. LMTCB

## 2018-07-14 NOTE — Assessment & Plan Note (Signed)
Chronic alcohol abuse, drinking 4-6 40ounce beers per day. Platelets normal. Negative hepatitis panel. Transaminases normal. Concern for hepatomegaly on RUQ ultrasound. Will update with elastography. May need EGD if concern for advanced fibrosis/cirrhosis on ultrasound. Possible EGD at time of colonoscopy. Risks and benefits discussed with stated understanding. Propofol due to history of alcohol abuse. Discussed in detail the risks of morbidity and early death if she continues to drink as she is doing. She is aware.

## 2018-07-14 NOTE — Assessment & Plan Note (Addendum)
Pleasant 63 year old female with need for initial screening colonoscopy. Noting scant tissue hematochezia 4-5 months ago in setting of constipation but none since. No family history of colorectal cancer or colon polyps.  Proceed with colonoscopy with Dr. Darrick Penna in the near future. The risks, benefits, and alternatives have been discussed in detail with the patient. They state understanding and desire to proceed.  Propofol due to alcohol abuse.

## 2018-07-18 NOTE — Progress Notes (Signed)
CC'D TO PCP °

## 2018-07-21 ENCOUNTER — Ambulatory Visit (HOSPITAL_COMMUNITY)
Admission: RE | Admit: 2018-07-21 | Discharge: 2018-07-21 | Disposition: A | Discharge status: Home / Self Care | Payer: Medicaid Other | Source: Ambulatory Visit | Attending: Gastroenterology | Admitting: Gastroenterology

## 2018-07-21 DIAGNOSIS — K760 Fatty (change of) liver, not elsewhere classified: Secondary | ICD-10-CM | POA: Diagnosis present

## 2018-07-28 NOTE — Progress Notes (Signed)
Elastography reviewed. She has minimal fibrosis calculated at F0/F1. She needs to abstain from alcohol in the setting of fatty liver, as she could have progression of fibrosis over time leading to cirrhosis.

## 2018-07-28 NOTE — Progress Notes (Signed)
LMOM to call.

## 2018-07-28 NOTE — Progress Notes (Signed)
Left message for pt to call.

## 2018-08-01 NOTE — Progress Notes (Signed)
Letter mailed to pt to call.  

## 2018-09-01 ENCOUNTER — Ambulatory Visit: Payer: Medicaid Other | Admitting: Nurse Practitioner

## 2018-09-04 ENCOUNTER — Other Ambulatory Visit: Payer: Self-pay

## 2018-09-04 ENCOUNTER — Emergency Department (HOSPITAL_COMMUNITY)
Admission: EM | Admit: 2018-09-04 | Discharge: 2018-09-05 | Disposition: A | Discharge status: Home / Self Care | Payer: Medicaid Other | Attending: Emergency Medicine | Admitting: Emergency Medicine

## 2018-09-04 ENCOUNTER — Encounter (HOSPITAL_COMMUNITY): Payer: Self-pay | Admitting: *Deleted

## 2018-09-04 DIAGNOSIS — R7401 Elevation of levels of liver transaminase levels: Secondary | ICD-10-CM

## 2018-09-04 DIAGNOSIS — J449 Chronic obstructive pulmonary disease, unspecified: Secondary | ICD-10-CM | POA: Insufficient documentation

## 2018-09-04 DIAGNOSIS — F17210 Nicotine dependence, cigarettes, uncomplicated: Secondary | ICD-10-CM | POA: Insufficient documentation

## 2018-09-04 DIAGNOSIS — R740 Nonspecific elevation of levels of transaminase and lactic acid dehydrogenase [LDH]: Secondary | ICD-10-CM | POA: Insufficient documentation

## 2018-09-04 DIAGNOSIS — I10 Essential (primary) hypertension: Secondary | ICD-10-CM | POA: Diagnosis not present

## 2018-09-04 DIAGNOSIS — F1010 Alcohol abuse, uncomplicated: Secondary | ICD-10-CM | POA: Insufficient documentation

## 2018-09-04 DIAGNOSIS — K921 Melena: Secondary | ICD-10-CM | POA: Diagnosis present

## 2018-09-04 DIAGNOSIS — K649 Unspecified hemorrhoids: Secondary | ICD-10-CM | POA: Diagnosis not present

## 2018-09-04 DIAGNOSIS — E119 Type 2 diabetes mellitus without complications: Secondary | ICD-10-CM | POA: Insufficient documentation

## 2018-09-04 NOTE — ED Triage Notes (Signed)
Pt c/o some bright red blood in her bowel movements

## 2018-09-04 NOTE — ED Provider Notes (Signed)
Emergency Department Provider Note   I have reviewed the triage vital signs and the nursing notes.   HISTORY  Chief Complaint Rectal Bleeding   HPI Gloria Santana is a 63 y.o. female who presents the emergency department today after one episode of bright red blood in her stool.  Patient states that she has had this intermittently in the past but not in the last 5 years.  She is not had any syncope, lightheadedness, nausea or other associated symptoms.  Has not had any dark stools.  She states her next colonoscopy is coming in a couple weeks.  No blood thinners.  Drinks quite a bit of alcohol as much as 640 ounce beers a day. No abdominal pain but states she does have some liver issues.  No other associated or modifying symptoms.    Past Medical History:  Diagnosis Date  . Alcohol dependence (HCC)   . COPD (chronic obstructive pulmonary disease) (HCC)   . Diabetes mellitus without complication (HCC)    Pt states she is not diabetic  . Hypertension   . Poor circulation     Patient Active Problem List   Diagnosis Date Noted  . Fatty liver 07/13/2018  . Encounter for screening colonoscopy 07/13/2018    Past Surgical History:  Procedure Laterality Date  . ABDOMINAL HYSTERECTOMY      Current Outpatient Rx  . Order #: [id number] Class: Normal    Allergies Patient has no known allergies.  Family History  Problem Relation Age of Onset  . Diabetes Mother   . Colon cancer Neg Hx   . Colon polyps Neg Hx     Social History Social History   Tobacco Use  . Smoking status: Current Every Day Smoker    Packs/day: 2.00    Types: Cigarettes  . Smokeless tobacco: Never Used  Substance Use Topics  . Alcohol use: Yes    Alcohol/week: 4.0 - 6.0 standard drinks    Types: 4 - 6 Cans of beer per week    Comment: 4-6 40 oz beers daily. Family states pt drinks all day everyday. (back to back). Patient states sometimes drinks more.   . Drug use: No    Review of Systems  All  other systems negative except as documented in the HPI. All pertinent positives and negatives as reviewed in the HPI. ____________________________________________   PHYSICAL EXAM:  VITAL SIGNS: Vitals:   09/04/18 2344 09/05/18 0000 09/05/18 0056  BP:  114/86 111/88  Pulse:  83 73  Resp:  16 16  SpO2:  97% 98%  Weight: 49.9 kg    Height: 5\' 7"  (1.702 m)      Constitutional: Alert and oriented. Well appearing and in no acute distress. Eyes: Conjunctivae are normal. PERRL. EOMI. Head: Atraumatic. Nose: No congestion/rhinnorhea. Mouth/Throat: Mucous membranes are moist.  Oropharynx non-erythematous. Neck: No stridor.  No meningeal signs.   Cardiovascular: Normal rate, regular rhythm. Good peripheral circulation. Grossly normal heart sounds.   Respiratory: Normal respiratory effort.  No retractions. Lungs CTAB. Gastrointestinal: Soft and nontender. No distention.  Musculoskeletal: No lower extremity tenderness nor edema. No gross deformities of extremities. Neurologic:  Normal speech and language. No gross focal neurologic deficits are appreciated.  Skin:  Skin is warm, dry and intact. No rash noted.   ____________________________________________   LABS (all labs ordered are listed, but only abnormal results are displayed)  Labs Reviewed  CBC WITH DIFFERENTIAL/PLATELET - Abnormal; Notable for the following components:      Result Value  WBC 2.8 (*)    HCT 33.6 (*)    Platelets 105 (*)    Neutro Abs 1.1 (*)    All other components within normal limits  COMPREHENSIVE METABOLIC PANEL - Abnormal; Notable for the following components:   Sodium 131 (*)    Chloride 94 (*)    BUN 6 (*)    Creatinine, Ser 0.37 (*)    AST 235 (*)    ALT 82 (*)    Alkaline Phosphatase 142 (*)    All other components within normal limits  PROTIME-INR   ____________________________________________  PROCEDURES  Procedure(s) performed:   Procedures  Procedure Note:  Anoscopy  Procedure:  Anoscopy  Diagnosis: Rectal bleeding  Procedure performed by: Marily Memos, MD  Informed Consent: Informed consent was obtained. Risks benefits alternatives of the procedure given to the patient  Verification: I have verified the correct patient, correct procedure, correct position, correct site/side, and available equipment.  Anesthesia/Sedation: None  Procedure Note: Universal precautions were observed. An anoscope was easily passed. Under direct visualization, internal hemorrhoid hemostatic but with evidence of recent bleeding. External hemorrhoid as well. There was no evidence of foreign body. The patient tolerated the procedure well. There was no blood loss. There were no complications.  ____________________________________________   INITIAL IMPRESSION / ASSESSMENT AND PLAN / ED COURSE  No anemia no persistent bleeding.  Source of blood likely from this hemorrhoid.  She really has GI follow-up and she will address it then.  She does have some worsening transaminitis as well but no indication for admission or further work-up in the emergency room at this time.     Pertinent labs & imaging results that were available during my care of the patient were reviewed by me and considered in my medical decision making (see chart for details).  ____________________________________________  FINAL CLINICAL IMPRESSION(S) / ED DIAGNOSES  Final diagnoses:  Hemorrhoids, unspecified hemorrhoid type  Transaminitis  Alcohol abuse     MEDICATIONS GIVEN DURING THIS VISIT:  Medications - No data to display   NEW OUTPATIENT MEDICATIONS STARTED DURING THIS VISIT:  Discharge Medication List as of 09/05/2018 12:50 AM    START taking these medications   Details  hydrocortisone cream 1 % Apply to affected area 2 times daily, Normal        Note:  This note was prepared with assistance of Dragon voice recognition software. Occasional wrong-word or sound-a-like substitutions may have  occurred due to the inherent limitations of voice recognition software.   Gloria Santana, Gloria Cower, MD 09/05/18 [phone]

## 2018-09-05 LAB — CBC WITH DIFFERENTIAL/PLATELET
Abs Immature Granulocytes: 0.01 10*3/uL (ref 0.00–0.07)
Basophils Absolute: 0 10*3/uL (ref 0.0–0.1)
Basophils Relative: 1 %
Eosinophils Absolute: 0.1 10*3/uL (ref 0.0–0.5)
Eosinophils Relative: 3 %
HCT: 33.6 % — ABNORMAL LOW (ref 36.0–46.0)
Hemoglobin: 12 g/dL (ref 12.0–15.0)
Immature Granulocytes: 0 %
Lymphocytes Relative: 43 %
Lymphs Abs: 1.2 10*3/uL (ref 0.7–4.0)
MCH: 30.1 pg (ref 26.0–34.0)
MCHC: 35.7 g/dL (ref 30.0–36.0)
MCV: 84.2 fL (ref 80.0–100.0)
MONO ABS: 0.5 10*3/uL (ref 0.1–1.0)
Monocytes Relative: 16 %
NEUTROS ABS: 1.1 10*3/uL — AB (ref 1.7–7.7)
Neutrophils Relative %: 37 %
Platelets: 105 10*3/uL — ABNORMAL LOW (ref 150–400)
RBC: 3.99 MIL/uL (ref 3.87–5.11)
RDW: 12.7 % (ref 11.5–15.5)
WBC: 2.8 10*3/uL — ABNORMAL LOW (ref 4.0–10.5)
nRBC: 0 % (ref 0.0–0.2)

## 2018-09-05 LAB — COMPREHENSIVE METABOLIC PANEL
ALT: 82 U/L — ABNORMAL HIGH (ref 0–44)
AST: 235 U/L — ABNORMAL HIGH (ref 15–41)
Albumin: 4.2 g/dL (ref 3.5–5.0)
Alkaline Phosphatase: 142 U/L — ABNORMAL HIGH (ref 38–126)
Anion gap: 15 (ref 5–15)
BUN: 6 mg/dL — ABNORMAL LOW (ref 8–23)
CO2: 22 mmol/L (ref 22–32)
Calcium: 9 mg/dL (ref 8.9–10.3)
Chloride: 94 mmol/L — ABNORMAL LOW (ref 98–111)
Creatinine, Ser: 0.37 mg/dL — ABNORMAL LOW (ref 0.44–1.00)
GFR calc non Af Amer: >60 mL/min (ref >60)
Glucose, Bld: 94 mg/dL (ref 70–99)
Potassium: 4.2 mmol/L (ref 3.5–5.1)
Sodium: 131 mmol/L — ABNORMAL LOW (ref 135–145)
Total Bilirubin: 0.9 mg/dL (ref 0.3–1.2)
Total Protein: 7.3 g/dL (ref 6.5–8.1)

## 2018-09-05 LAB — PROTIME-INR
INR: 1 (ref 0.8–1.2)
Prothrombin Time: 12.9 seconds (ref 11.4–15.2)

## 2018-09-05 MED ORDER — HYDROCORTISONE 1 % EX CREA: TOPICAL_CREAM | CUTANEOUS | 0 refills | Status: DC

## 2018-09-20 ENCOUNTER — Telehealth: Payer: Self-pay | Admitting: *Deleted

## 2018-09-20 NOTE — Telephone Encounter (Signed)
Pt called office, informed her procedure will need to be rescheduled. Will call her when [facility]'s July schedule is available.

## 2018-09-20 NOTE — Telephone Encounter (Signed)
Per [facility]: REVIEWED EMR FROM 2016 TO PRESENT. [place] TCS/EGD AFTER JUN 1 DUE TO COVID 19 RESTRICTIONS.  LMTCB

## 2018-09-20 NOTE — Progress Notes (Signed)
See TE 09/20/2018 

## 2018-09-21 NOTE — Telephone Encounter (Signed)
Spoke with pt daughter Boyd Kerbs (on dpr). She is scheduled for 01/10/2019 at 8:30am. She is aware will mail new instructions/pre-op appt.

## 2018-09-28 ENCOUNTER — Inpatient Hospital Stay (HOSPITAL_COMMUNITY): Admission: RE | Admit: 2018-09-28 | Payer: Medicaid Other | Source: Ambulatory Visit

## 2018-10-12 ENCOUNTER — Ambulatory Visit: Payer: Medicaid Other | Admitting: Gastroenterology

## 2018-11-17 ENCOUNTER — Emergency Department (HOSPITAL_COMMUNITY)
Admission: EM | Admit: 2018-11-17 | Discharge: 2018-11-17 | Disposition: A | Discharge status: Home / Self Care | Payer: Medicaid Other | Attending: Emergency Medicine | Admitting: Emergency Medicine

## 2018-11-17 ENCOUNTER — Emergency Department (HOSPITAL_COMMUNITY): Payer: Medicaid Other

## 2018-11-17 ENCOUNTER — Other Ambulatory Visit: Payer: Self-pay

## 2018-11-17 DIAGNOSIS — Y92009 Unspecified place in unspecified non-institutional (private) residence as the place of occurrence of the external cause: Secondary | ICD-10-CM | POA: Insufficient documentation

## 2018-11-17 DIAGNOSIS — E119 Type 2 diabetes mellitus without complications: Secondary | ICD-10-CM | POA: Diagnosis not present

## 2018-11-17 DIAGNOSIS — D72819 Decreased white blood cell count, unspecified: Secondary | ICD-10-CM | POA: Insufficient documentation

## 2018-11-17 DIAGNOSIS — Z23 Encounter for immunization: Secondary | ICD-10-CM | POA: Diagnosis not present

## 2018-11-17 DIAGNOSIS — Y999 Unspecified external cause status: Secondary | ICD-10-CM | POA: Diagnosis not present

## 2018-11-17 DIAGNOSIS — Z79899 Other long term (current) drug therapy: Secondary | ICD-10-CM | POA: Insufficient documentation

## 2018-11-17 DIAGNOSIS — S0181XA Laceration without foreign body of other part of head, initial encounter: Secondary | ICD-10-CM | POA: Insufficient documentation

## 2018-11-17 DIAGNOSIS — F10920 Alcohol use, unspecified with intoxication, uncomplicated: Secondary | ICD-10-CM | POA: Insufficient documentation

## 2018-11-17 DIAGNOSIS — S0990XA Unspecified injury of head, initial encounter: Secondary | ICD-10-CM | POA: Diagnosis not present

## 2018-11-17 DIAGNOSIS — J449 Chronic obstructive pulmonary disease, unspecified: Secondary | ICD-10-CM | POA: Diagnosis not present

## 2018-11-17 DIAGNOSIS — F17210 Nicotine dependence, cigarettes, uncomplicated: Secondary | ICD-10-CM | POA: Diagnosis not present

## 2018-11-17 DIAGNOSIS — I10 Essential (primary) hypertension: Secondary | ICD-10-CM | POA: Diagnosis not present

## 2018-11-17 DIAGNOSIS — D696 Thrombocytopenia, unspecified: Secondary | ICD-10-CM | POA: Insufficient documentation

## 2018-11-17 DIAGNOSIS — Y9389 Activity, other specified: Secondary | ICD-10-CM | POA: Insufficient documentation

## 2018-11-17 DIAGNOSIS — W010XXA Fall on same level from slipping, tripping and stumbling without subsequent striking against object, initial encounter: Secondary | ICD-10-CM | POA: Insufficient documentation

## 2018-11-17 DIAGNOSIS — E871 Hypo-osmolality and hyponatremia: Secondary | ICD-10-CM | POA: Diagnosis not present

## 2018-11-17 LAB — CBC WITH DIFFERENTIAL/PLATELET
Abs Immature Granulocytes: 0.01 10*3/uL (ref 0.00–0.07)
Basophils Absolute: 0 10*3/uL (ref 0.0–0.1)
Basophils Relative: 1 %
Eosinophils Absolute: 0.1 10*3/uL (ref 0.0–0.5)
Eosinophils Relative: 3 %
HCT: 31.8 % — ABNORMAL LOW (ref 36.0–46.0)
Hemoglobin: 11.2 g/dL — ABNORMAL LOW (ref 12.0–15.0)
Immature Granulocytes: 0 %
Lymphocytes Relative: 54 %
Lymphs Abs: 1.4 10*3/uL (ref 0.7–4.0)
MCH: 30.5 pg (ref 26.0–34.0)
MCHC: 35.2 g/dL (ref 30.0–36.0)
MCV: 86.6 fL (ref 80.0–100.0)
Monocytes Absolute: 0.4 10*3/uL (ref 0.1–1.0)
Monocytes Relative: 14 %
Neutro Abs: 0.7 10*3/uL — ABNORMAL LOW (ref 1.7–7.7)
Neutrophils Relative %: 28 %
Platelets: 106 10*3/uL — ABNORMAL LOW (ref 150–400)
RBC: 3.67 MIL/uL — ABNORMAL LOW (ref 3.87–5.11)
RDW: 14 % (ref 11.5–15.5)
WBC: 2.6 10*3/uL — ABNORMAL LOW (ref 4.0–10.5)
nRBC: 0 % (ref 0.0–0.2)

## 2018-11-17 LAB — BASIC METABOLIC PANEL
Anion gap: 13 (ref 5–15)
BUN: <5 mg/dL — ABNORMAL LOW (ref 8–23)
CO2: 24 mmol/L (ref 22–32)
Calcium: 8.6 mg/dL — ABNORMAL LOW (ref 8.9–10.3)
Chloride: 90 mmol/L — ABNORMAL LOW (ref 98–111)
Creatinine, Ser: 0.54 mg/dL (ref 0.44–1.00)
GFR calc Af Amer: >60 mL/min (ref >60)
GFR calc non Af Amer: >60 mL/min (ref >60)
Glucose, Bld: 90 mg/dL (ref 70–99)
Potassium: 3.6 mmol/L (ref 3.5–5.1)
Sodium: 127 mmol/L — ABNORMAL LOW (ref 135–145)

## 2018-11-17 LAB — TROPONIN I
Troponin I: <0.03 ng/mL (ref <0.03)
Troponin I: <0.03 ng/mL (ref <0.03)

## 2018-11-17 LAB — ETHANOL: Alcohol, Ethyl (B): 362 mg/dL (ref <10)

## 2018-11-17 MED ORDER — TETANUS-DIPHTH-ACELL PERTUSSIS 5-2.5-18.5 LF-MCG/0.5 IM SUSP
0.5000 mL | Freq: Once | INTRAMUSCULAR | Status: AC
  Administered 2018-11-17: 0.5 mL via INTRAMUSCULAR
  Filled 2018-11-17: qty 0.5

## 2018-11-17 NOTE — Discharge Instructions (Addendum)
Follow up with your md next week.  Stop drinking alcohol

## 2018-11-17 NOTE — ED Notes (Signed)
Date and time results received: 11/17/18 0700 (use smartphrase ".now" to insert current time)  Test: ethyl Critical Value: 362  Name of Provider Notified: Dr. Preston Fleeting  Orders Received? Or Actions Taken?: n/a

## 2018-11-17 NOTE — ED Triage Notes (Signed)
EMS reports patient's blood glucose was 86. Patient states she take aspirin but no other blood thinners or anticoagulants.

## 2018-11-17 NOTE — ED Notes (Signed)
Patient transported to CT 

## 2018-11-17 NOTE — ED Provider Notes (Signed)
[facility] EMERGENCY DEPARTMENT Provider Note   CSN: [id number] Arrival date & time: 11/17/18  0097    History   Chief Complaint Chief Complaint  Patient presents with  . Fall    laceration to the head    HPI Gloria Santana is a 63 y.o. female.   The history is provided by the patient.  She has history of hypertension, diabetes, COPD, alcohol dependence and comes in following a fall at home.  She was watching television and states that she had a nosebleed and as she was getting up to walk to the bathroom, she lost her balance and fell causing a laceration to her forehead.  She is not sure when her last tetanus immunization was.  She denies other injury.  She does admit to drinking tonight, but is somewhat vague on the amount.  Past Medical History:  Diagnosis Date  . Alcohol dependence (HCC)   . COPD (chronic obstructive pulmonary disease) (HCC)   . Diabetes mellitus without complication (HCC)    Pt states she is not diabetic  . Hypertension   . Poor circulation     Patient Active Problem List   Diagnosis Date Noted  . Fatty liver 07/13/2018  . Encounter for screening colonoscopy 07/13/2018    Past Surgical History:  Procedure Laterality Date  . ABDOMINAL HYSTERECTOMY       OB History   No obstetric history on file.      Home Medications    Prior to Admission medications   Medication Sig Start Date End Date Taking? Authorizing Provider  folic acid (FOLVITE) 1 MG tablet Take 1 mg by mouth daily.    [provider]  hydrocortisone cream 1 % Apply to affected area 2 times daily Patient not taking: Reported on 09/15/2018 09/05/18   Mesner, Barbara Cower, MD  Multiple Vitamin (MULTIVITAMIN WITH MINERALS) TABS tablet Take 1 tablet by mouth daily.    [provider]    Family History Family History  Problem Relation Age of Onset  . Diabetes Mother   . Colon cancer Neg Hx   . Colon polyps Neg Hx     Social History Social History   Tobacco Use  .  Smoking status: Current Every Day Smoker    Packs/day: 2.00    Types: Cigarettes  . Smokeless tobacco: Never Used  Substance Use Topics  . Alcohol use: Yes    Alcohol/week: 4.0 - 6.0 standard drinks    Types: 4 - 6 Cans of beer per week    Comment: 4-6 40 oz beers daily. Family states pt drinks all day everyday. (back to back). Patient states sometimes drinks more.   . Drug use: No     Allergies   Patient has no known allergies.   Review of Systems Review of Systems  All other systems reviewed and are negative.    Physical Exam Updated Vital Signs BP 110/83 (BP Location: Right Arm)   Pulse 82   Temp (!) 97.2 F (36.2 C) (Oral)   Resp 18   Ht 5' 7.5" (1.715 m)   Wt 54.4 kg   SpO2 98%   BMI 18.52 kg/m   Physical Exam Vitals signs and nursing note reviewed.    63 year old female, resting comfortably and in no acute distress. Vital signs are normal. Oxygen saturation is 98%, which is normal. Head is normocephalic.  Laceration present on the right side of the forehead. PERRLA, EOMI. Oropharynx is clear. Neck is nontender without adenopathy or  JVD. Back is nontender and there is no CVA tenderness. Lungs are clear without rales, wheezes, or rhonchi. Chest is nontender. Heart has regular rate and rhythm without murmur. Abdomen is soft, flat, nontender without masses or hepatosplenomegaly and peristalsis is normoactive. Extremities have no cyanosis or edema, full range of motion is present. Skin is warm and dry without rash. Neurologic: Awake and alert, clinically intoxicated with slightly slurred speech, cranial nerves are intact, there are no motor or sensory deficits.  ED Treatments / Results  Labs (all labs ordered are listed, but only abnormal results are displayed) Labs Reviewed  CBC WITH DIFFERENTIAL/PLATELET  BASIC METABOLIC PANEL  TROPONIN I  ETHANOL  TROPONIN I    EKG EKG Interpretation  Date/Time:  Thursday November 17 2018 05:25:25 EDT Ventricular  Rate:  85 PR Interval:    QRS Duration: 74 QT Interval:  413 QTC Calculation: 492 R Axis:   57 Text Interpretation:  Sinus rhythm Abnormal R-wave progression, early transition Minimal ST elevation, anterior leads Borderline prolonged QT interval When compared with ECG of 10/18/2011, ST elevation in Anterolateral leads is now present Confirmed by Dione BoozeGlick, Joeann Steppe ([id number]) on 11/17/2018 5:32:58 AM   Radiology No results found.  Procedures .Marland Kitchen.Laceration Repair Date/Time: 11/17/2018 5:40 AM Performed by: Dione BoozeGlick, Ladina Shutters, MD Authorized by: Dione BoozeGlick, Arieona Swaggerty, MD   Consent:    Consent obtained:  Verbal   Consent given by:  Patient   Risks discussed:  Infection, pain and poor cosmetic result   Alternatives discussed:  No treatment Anesthesia (see MAR for exact dosages):    Anesthesia method:  Local infiltration   Local anesthetic:  Lidocaine 2% WITH epi Laceration details:    Location:  Face   Face location:  Forehead   Length (cm):  2   Depth (mm):  3 Repair type:    Repair type:  Simple Pre-procedure details:    Preparation:  Patient was prepped and draped in usual sterile fashion Exploration:    Hemostasis achieved with:  Direct pressure   Wound exploration: entire depth of wound probed and visualized     Wound extent: no foreign bodies/material noted     Contaminated: no   Treatment:    Area cleansed with:  Saline   Amount of cleaning:  Standard Skin repair:    Repair method:  Sutures   Suture size:  5-0   Suture material:  Prolene   Suture technique:  Simple interrupted   Number of sutures:  4 Approximation:    Approximation:  Close Post-procedure details:    Dressing:  Antibiotic ointment and sterile dressing   Patient tolerance of procedure:  Tolerated well, no immediate complications    Medications Ordered in ED Medications  Tdap (BOOSTRIX) injection 0.5 mL (has no administration in time range)     Initial Impression / Assessment and Plan / ED Course  I have reviewed the  triage vital signs and the nursing notes.  Pertinent labs & imaging results that were available during my care of the patient were reviewed by me and considered in my medical decision making (see chart for details).  Fall at home with forehead laceration.  EMS was unable to control bleeding, so laceration was closed with sutures with good control of bleeding.  ECG was obtained and does show some ST elevation in the anterolateral leads which had not been present previously.  Patient is an unreliable historian, so we will check troponin and delta troponin.  She will be sent for CT of head and cervical  spine.  CT scans showed no acute injury.  Initial troponin is normal.  Remainder of labs are significant for hyponatremia which is chronic and unchanged, leukopenia which is chronic and unchanged, thrombocytopenia which is chronic and unchanged, and mild anemia.  Case is signed out to Dr. Estell Harpin to evaluate delta troponin.  Final Clinical Impressions(s) / ED Diagnoses   Final diagnoses:  Fall from slip, trip, or stumble, initial encounter  Forehead laceration, initial encounter  Alcohol intoxication, uncomplicated (HCC)  Hyponatremia  Leukopenia, unspecified type  Thrombocytopenia [facility])    ED Discharge Orders    None       Dione Booze, MD 11/17/18 [phone]

## 2018-11-17 NOTE — ED Triage Notes (Signed)
Patient fell and hit her head. Unknown when the fall happened. Patient has been drinking alcohol unknown amount. Patient has a contusion forehead and 2 inch right sided temple. No loss of consciousness.

## 2018-12-07 ENCOUNTER — Encounter (HOSPITAL_COMMUNITY): Payer: Self-pay | Admitting: Emergency Medicine

## 2018-12-07 ENCOUNTER — Other Ambulatory Visit: Payer: Self-pay

## 2018-12-07 ENCOUNTER — Emergency Department (HOSPITAL_COMMUNITY)
Admission: EM | Admit: 2018-12-07 | Discharge: 2018-12-07 | Disposition: A | Discharge status: Home / Self Care | Payer: Medicaid Other | Attending: Emergency Medicine | Admitting: Emergency Medicine

## 2018-12-07 DIAGNOSIS — E119 Type 2 diabetes mellitus without complications: Secondary | ICD-10-CM | POA: Insufficient documentation

## 2018-12-07 DIAGNOSIS — Z4802 Encounter for removal of sutures: Secondary | ICD-10-CM | POA: Diagnosis present

## 2018-12-07 DIAGNOSIS — J449 Chronic obstructive pulmonary disease, unspecified: Secondary | ICD-10-CM | POA: Diagnosis not present

## 2018-12-07 DIAGNOSIS — I10 Essential (primary) hypertension: Secondary | ICD-10-CM | POA: Insufficient documentation

## 2018-12-07 DIAGNOSIS — F17210 Nicotine dependence, cigarettes, uncomplicated: Secondary | ICD-10-CM | POA: Diagnosis not present

## 2018-12-07 NOTE — Discharge Instructions (Signed)
Your wound appears to be healing well. Continue to keep the area clean and dry.   May apply ointments or lotions such as Aquaphor to the area to reduce scarring. The key is to keep the skin well hydrated and supple. It is also important to stay well hydrated by drinking plenty of water. Keep your scar protected from the sun. Cover the scar with sunscreen that has an SPF (sun protection factor) of 30 or higher. Do not put sunscreen on your scar until it has healed. Gently massage the scar using a circular motion. This will help to minimize the appearance of the scar. Do this only after the incision has closed and all of the sutures have been removed. Remember that the scar may appear lighter or darker than your normal skin color. This difference in color should even out with time. If your scar does not fade or go away with time and you do not like how it looks, consider talking with a plastic surgeon or a dermatologist.  

## 2018-12-07 NOTE — ED Provider Notes (Signed)
[facility] EMERGENCY DEPARTMENT Provider Note   CSN: [id number] Arrival date & time: 12/07/18  1642    History   Chief Complaint Chief Complaint  Patient presents with  . Suture / Staple Removal    HPI Gloria Santana is a 63 y.o. female.     HPI   Gloria Santana is a 63 y.o. female, with a history of alcohol dependence, COPD, DM, HTN, presenting to the ED for suture removal from a wound to the right forehead.  Patient had 4 sutures placed in the ED on June 4.  Denies fever, dehiscence, color change, increased pain, drainage from the wound, or any other issues.     Past Medical History:  Diagnosis Date  . Alcohol dependence ([place])   . COPD (chronic obstructive pulmonary disease) ([place])   . Diabetes mellitus without complication ([place])    Pt states she is not diabetic  . Hypertension   . Poor circulation     Patient Active Problem List   Diagnosis Date Noted  . Fatty liver 07/13/2018  . Encounter for screening colonoscopy 07/13/2018    Past Surgical History:  Procedure Laterality Date  . ABDOMINAL HYSTERECTOMY       OB History   No obstetric history on file.      Home Medications    Prior to Admission medications   Medication Sig Start Date End Date Taking? Authorizing Provider  folic acid (FOLVITE) 1 MG tablet Take 1 mg by mouth daily.    [provider]  hydrocortisone cream 1 % Apply to affected area 2 times daily Patient not taking: Reported on 09/15/2018 09/05/18   Mesner, Corene Cornea, MD  Multiple Vitamin (MULTIVITAMIN WITH MINERALS) TABS tablet Take 1 tablet by mouth daily.    [provider]    Family History Family History  Problem Relation Age of Onset  . Diabetes Mother   . Colon cancer Neg Hx   . Colon polyps Neg Hx     Social History Social History   Tobacco Use  . Smoking status: Current Every Day Smoker    Packs/day: 2.00    Types: Cigarettes  . Smokeless tobacco: Never Used  Substance Use Topics  . Alcohol use:  Yes    Alcohol/week: 4.0 - 6.0 standard drinks    Types: 4 - 6 Cans of beer per week    Comment: 4-6 40 oz beers daily. Family states pt drinks all day everyday. (back to back). Patient states sometimes drinks more.   . Drug use: No     Allergies   Patient has no known allergies.   Review of Systems Review of Systems  Constitutional: Negative for fever.  Skin: Positive for wound.     Physical Exam Updated Vital Signs BP 111/78 (BP Location: Right Arm)   Pulse 81   Temp 98 F (36.7 C) (Oral)   Resp 16   SpO2 94%   Physical Exam Vitals signs and nursing note reviewed.  Constitutional:      General: She is not in acute distress.    Appearance: She is well-developed. She is not diaphoretic.  HENT:     Head: Normocephalic and atraumatic.     Comments: Wound to the right forehead appears well-healed without signs of dehiscence.  No erythema, tenderness, swelling, drainage, or other signs of infection. Eyes:     Conjunctiva/sclera: Conjunctivae normal.  Neck:     Musculoskeletal: Neck supple.  Cardiovascular:     Rate and Rhythm: Normal rate and  regular rhythm.  Pulmonary:     Effort: Pulmonary effort is normal.  Skin:    General: Skin is warm and dry.     Coloration: Skin is not pale.  Neurological:     Mental Status: She is alert.  Psychiatric:        Behavior: Behavior normal.      ED Treatments / Results  Labs (all labs ordered are listed, but only abnormal results are displayed) Labs Reviewed - No data to display  EKG None  Radiology No results found.  Procedures .Suture Removal  Date/Time: 12/07/2018 5:50 PM Performed by: Anselm PancoastJoy, Keiosha Cancro C, PA-C Authorized by: Anselm PancoastJoy, Zyaire Mccleod C, PA-C   Consent:    Consent obtained:  Verbal   Consent given by:  Patient   Risks discussed:  Bleeding, pain and wound separation Location:    Location:  Head/neck   Head/neck location:  Forehead Procedure details:    Wound appearance:  No signs of infection, good wound  healing and clean   Number of sutures removed:  4 Post-procedure details:    Post-removal:  No dressing applied   Patient tolerance of procedure:  Tolerated well, no immediate complications   (including critical care time)  Medications Ordered in ED Medications - No data to display   Initial Impression / Assessment and Plan / ED Course  I have reviewed the triage vital signs and the nursing notes.  Pertinent labs & imaging results that were available during my care of the patient were reviewed by me and considered in my medical decision making (see chart for details).        Patient presents for suture removal.  No signs of dehiscence or wound infection. The patient was given instructions for continued home care as well as return precautions. Patient voices understanding of these instructions, accepts the plan, and is comfortable with discharge.  Final Clinical Impressions(s) / ED Diagnoses   Final diagnoses:  Visit for suture removal    ED Discharge Orders    None       Concepcion LivingJoy, Mukhtar Shams C, PA-C 12/07/18 1759    Donnetta Hutchingook, Brian, MD 12/08/18 1710

## 2018-12-07 NOTE — ED Triage Notes (Signed)
pt needs sutures removed

## 2019-01-04 ENCOUNTER — Encounter (HOSPITAL_COMMUNITY): Payer: Self-pay

## 2019-01-04 ENCOUNTER — Encounter (HOSPITAL_COMMUNITY)
Admission: RE | Admit: 2019-01-04 | Discharge: 2019-01-04 | Disposition: A | Discharge status: Home / Self Care | Payer: Medicaid Other | Source: Ambulatory Visit | Attending: Gastroenterology | Admitting: Gastroenterology

## 2019-01-04 ENCOUNTER — Other Ambulatory Visit: Payer: Self-pay

## 2019-01-04 DIAGNOSIS — Z01812 Encounter for preprocedural laboratory examination: Secondary | ICD-10-CM | POA: Insufficient documentation

## 2019-01-04 LAB — BASIC METABOLIC PANEL
Anion gap: 14 (ref 5–15)
BUN: <5 mg/dL — ABNORMAL LOW (ref 8–23)
CO2: 24 mmol/L (ref 22–32)
Calcium: 9.6 mg/dL (ref 8.9–10.3)
Chloride: 94 mmol/L — ABNORMAL LOW (ref 98–111)
Creatinine, Ser: 0.43 mg/dL — ABNORMAL LOW (ref 0.44–1.00)
GFR calc Af Amer: >60 mL/min (ref >60)
GFR calc non Af Amer: >60 mL/min (ref >60)
Glucose, Bld: 82 mg/dL (ref 70–99)
Potassium: 4.3 mmol/L (ref 3.5–5.1)
Sodium: 132 mmol/L — ABNORMAL LOW (ref 135–145)

## 2019-01-04 LAB — CBC
HCT: 37.4 % (ref 36.0–46.0)
Hemoglobin: 13.2 g/dL (ref 12.0–15.0)
MCH: 30.8 pg (ref 26.0–34.0)
MCHC: 35.3 g/dL (ref 30.0–36.0)
MCV: 87.4 fL (ref 80.0–100.0)
Platelets: 147 10*3/uL — ABNORMAL LOW (ref 150–400)
RBC: 4.28 MIL/uL (ref 3.87–5.11)
RDW: 13.9 % (ref 11.5–15.5)
WBC: 3.6 10*3/uL — ABNORMAL LOW (ref 4.0–10.5)
nRBC: 0 % (ref 0.0–0.2)

## 2019-01-04 NOTE — Pre-Procedure Instructions (Signed)
In for PAT and noticed right eye was draining a clear drainage and was red with crusty dried drainage as well. Patient states that it did not hurt and that it started 2 days ago. I spoke with her daughter and patient has a PCP appointment tomorrow. She will make sure they examine her Mom's eye and tell them of her upcoming TCS and see if they feel she should proceed with this. She will let us know if procedure needs to be postponed or not.

## 2019-01-04 NOTE — Patient Instructions (Signed)
Gloria AlandKatie S Santana  01/04/2019     @PREFPERIOPPHARMACY @   Your procedure is scheduled on  01/10/2019 .  [facility] at  700   A.M.  Call this number if you have problems the morning of surgery:  [phone]   Remember:  Follow the diet and prep instructions given to you by Dr Evelina DunField's office.                      Take these medicines the morning of surgery with A SIP OF WATER  None    Do not wear jewelry, make-up or nail polish.  Do not wear lotions, powders, or perfumes, or deodorant.  Do not shave 48 hours prior to surgery.  Men may shave face and neck.  Do not bring valuables to the hospital.  [facility] any belongings or valuables.  Contacts, dentures or bridgework may not be worn into surgery.  Leave your suitcase in the car.  After surgery it may be brought to your room.  For patients admitted to the hospital, discharge time will be determined by your treatment team.  Patients discharged the day of surgery will not be allowed to drive home.   Name and phone number of your driver:   family Special instructions:  None  Please read over the following fact sheets that you were given. Anesthesia Post-op Instructions and Care and Recovery After Surgery       Upper Endoscopy, Adult, Care After This sheet gives you information about how to care for yourself after your procedure. Your health care provider may also give you more specific instructions. If you have problems or questions, contact your health care provider. What can I expect after the procedure? After the procedure, it is common to have:  A sore throat.  Mild stomach pain or discomfort.  Bloating.  Nausea. Follow these instructions at home:   Follow instructions from your health care provider about what to eat or drink after your procedure.  Return to your normal activities as told by your health care provider. Ask your health care provider what activities are  safe for you.  Take over-the-counter and prescription medicines only as told by your health care provider.  Do not drive for 24 hours if you were given a sedative during your procedure.  Keep all follow-up visits as told by your health care provider. This is important. Contact a health care provider if you have:  A sore throat that lasts longer than one day.  Trouble swallowing. Get help right away if:  You vomit blood or your vomit looks like coffee grounds.  You have: ? A fever. ? Bloody, black, or tarry stools. ? A severe sore throat or you cannot swallow. ? Difficulty breathing. ? Severe pain in your chest or abdomen. Summary  After the procedure, it is common to have a sore throat, mild stomach discomfort, bloating, and nausea.  Do not drive for 24 hours if you were given a sedative during the procedure.  Follow instructions from your health care provider about what to eat or drink after your procedure.  Return to your normal activities as told by your health care provider. This information is not intended to replace advice given to you by your health care provider. Make sure you discuss any questions you have with your health care provider. Document Released: 12/01/2011 Document Revised: 11/23/2017 Document Reviewed: 11/01/2017 Elsevier Patient Education  [place].  Colonoscopy, Adult, Care After This sheet gives you information about how to care for yourself after your procedure. Your health care provider may also give you more specific instructions. If you have problems or questions, contact your health care provider. What can I expect after the procedure? After the procedure, it is common to have:  A small amount of blood in your stool for 24 hours after the procedure.  Some gas.  Mild abdominal cramping or bloating. Follow these instructions at home: General instructions  For the first 24 hours after the procedure: ? Do not drive or use machinery.  ? Do not sign important documents. ? Do not drink alcohol. ? Do your regular daily activities at a slower pace than normal. ? Eat soft, easy-to-digest foods.  Take over-the-counter or prescription medicines only as told by your health care provider. Relieving cramping and bloating   Try walking around when you have cramps or feel bloated.  Apply heat to your abdomen as told by your health care provider. Use a heat source that your health care provider recommends, such as a moist heat pack or a heating pad. ? Place a towel between your skin and the heat source. ? Leave the heat on for 20-30 minutes. ? Remove the heat if your skin turns bright red. This is especially important if you are unable to feel pain, heat, or cold. You may have a greater risk of getting burned. Eating and drinking   Drink enough fluid to keep your urine pale yellow.  Resume your normal diet as instructed by your health care provider. Avoid heavy or fried foods that are hard to digest.  Avoid drinking alcohol for as long as instructed by your health care provider. Contact a health care provider if:  You have blood in your stool 2-3 days after the procedure. Get help right away if:  You have more than a small spotting of blood in your stool.  You pass large blood clots in your stool.  Your abdomen is swollen.  You have nausea or vomiting.  You have a fever.  You have increasing abdominal pain that is not relieved with medicine. Summary  After the procedure, it is common to have a small amount of blood in your stool. You may also have mild abdominal cramping and bloating.  For the first 24 hours after the procedure, do not drive or use machinery, sign important documents, or drink alcohol.  Contact your health care provider if you have a lot of blood in your stool, nausea or vomiting, a fever, or increased abdominal pain. This information is not intended to replace advice given to you by your health  care provider. Make sure you discuss any questions you have with your health care provider. Document Released: 01/14/2004 Document Revised: 03/24/2017 Document Reviewed: 08/13/2015 Elsevier Patient Education  2020 [place] After These instructions provide you with information about caring for yourself after your procedure. Your health care provider may also give you more specific instructions. Your treatment has been planned according to current medical practices, but problems sometimes occur. Call your health care provider if you have any problems or questions after your procedure. What can I expect after the procedure? After your procedure, you may:  Feel sleepy for several hours.  Feel clumsy and have poor balance for several hours.  Feel forgetful about what happened after the procedure.  Have poor judgment for several hours.  Feel nauseous or vomit.  Have a sore throat if you had a breathing tube during the procedure. Follow these instructions at home: For at least 24 hours after the procedure:      Have a responsible adult stay with you. It is important to have someone help care for you until you are awake and alert.  Rest as needed.  Do not: ? Participate in activities in which you could fall or become injured. ? Drive. ? Use heavy machinery. ? Drink alcohol. ? Take sleeping pills or medicines that cause drowsiness. ? Make important decisions or sign legal documents. ? Take care of children on your own. Eating and drinking  Follow the diet that is recommended by your health care provider.  If you vomit, drink water, juice, or soup when you can drink without vomiting.  Make sure you have little or no nausea before eating solid foods. General instructions  Take over-the-counter and prescription medicines only as told by your health care provider.  If you have sleep apnea, surgery and certain medicines can increase your risk  for breathing problems. Follow instructions from your health care provider about wearing your sleep device: ? Anytime you are sleeping, including during daytime naps. ? While taking prescription pain medicines, sleeping medicines, or medicines that make you drowsy.  If you smoke, do not smoke without supervision.  Keep all follow-up visits as told by your health care provider. This is important. Contact a health care provider if:  You keep feeling nauseous or you keep vomiting.  You feel light-headed.  You develop a rash.  You have a fever. Get help right away if:  You have trouble breathing. Summary  For several hours after your procedure, you may feel sleepy and have poor judgment.  Have a responsible adult stay with you for at least 24 hours or until you are awake and alert. This information is not intended to replace advice given to you by your health care provider. Make sure you discuss any questions you have with your health care provider. Document Released: 09/22/2015 Document Revised: 08/30/2017 Document Reviewed: 09/22/2015 Elsevier Patient Education  2020 [employer]

## 2019-01-05 ENCOUNTER — Other Ambulatory Visit: Payer: Self-pay

## 2019-01-05 ENCOUNTER — Encounter (HOSPITAL_COMMUNITY): Payer: Self-pay

## 2019-01-05 ENCOUNTER — Ambulatory Visit (HOSPITAL_COMMUNITY)
Admission: RE | Admit: 2019-01-05 | Discharge: 2019-01-05 | Disposition: A | Discharge status: Home / Self Care | Payer: Medicaid Other | Source: Ambulatory Visit | Attending: Internal Medicine | Admitting: Internal Medicine

## 2019-01-05 ENCOUNTER — Emergency Department (HOSPITAL_COMMUNITY): Payer: Medicaid Other

## 2019-01-05 ENCOUNTER — Other Ambulatory Visit (HOSPITAL_COMMUNITY): Payer: Self-pay | Admitting: Internal Medicine

## 2019-01-05 ENCOUNTER — Emergency Department (HOSPITAL_COMMUNITY)
Admission: EM | Admit: 2019-01-05 | Discharge: 2019-01-05 | Disposition: A | Discharge status: Home / Self Care | Payer: Medicaid Other | Attending: Emergency Medicine | Admitting: Emergency Medicine

## 2019-01-05 DIAGNOSIS — Z79899 Other long term (current) drug therapy: Secondary | ICD-10-CM | POA: Insufficient documentation

## 2019-01-05 DIAGNOSIS — Y999 Unspecified external cause status: Secondary | ICD-10-CM | POA: Insufficient documentation

## 2019-01-05 DIAGNOSIS — S0990XA Unspecified injury of head, initial encounter: Secondary | ICD-10-CM | POA: Diagnosis not present

## 2019-01-05 DIAGNOSIS — F17210 Nicotine dependence, cigarettes, uncomplicated: Secondary | ICD-10-CM | POA: Diagnosis not present

## 2019-01-05 DIAGNOSIS — J449 Chronic obstructive pulmonary disease, unspecified: Secondary | ICD-10-CM | POA: Diagnosis not present

## 2019-01-05 DIAGNOSIS — W19XXXA Unspecified fall, initial encounter: Secondary | ICD-10-CM | POA: Insufficient documentation

## 2019-01-05 DIAGNOSIS — R0789 Other chest pain: Secondary | ICD-10-CM | POA: Diagnosis not present

## 2019-01-05 DIAGNOSIS — Y929 Unspecified place or not applicable: Secondary | ICD-10-CM | POA: Diagnosis not present

## 2019-01-05 DIAGNOSIS — H1031 Unspecified acute conjunctivitis, right eye: Secondary | ICD-10-CM | POA: Diagnosis not present

## 2019-01-05 DIAGNOSIS — Y9389 Activity, other specified: Secondary | ICD-10-CM | POA: Diagnosis not present

## 2019-01-05 DIAGNOSIS — H16031 Corneal ulcer with hypopyon, right eye: Secondary | ICD-10-CM

## 2019-01-05 DIAGNOSIS — E119 Type 2 diabetes mellitus without complications: Secondary | ICD-10-CM | POA: Diagnosis not present

## 2019-01-05 DIAGNOSIS — I10 Essential (primary) hypertension: Secondary | ICD-10-CM | POA: Insufficient documentation

## 2019-01-05 DIAGNOSIS — S299XXA Unspecified injury of thorax, initial encounter: Secondary | ICD-10-CM | POA: Diagnosis present

## 2019-01-05 DIAGNOSIS — S20219A Contusion of unspecified front wall of thorax, initial encounter: Secondary | ICD-10-CM | POA: Diagnosis not present

## 2019-01-05 LAB — COMPREHENSIVE METABOLIC PANEL
ALT: 55 U/L — ABNORMAL HIGH (ref 0–44)
AST: 142 U/L — ABNORMAL HIGH (ref 15–41)
Albumin: 4.5 g/dL (ref 3.5–5.0)
Alkaline Phosphatase: 156 U/L — ABNORMAL HIGH (ref 38–126)
Anion gap: 11 (ref 5–15)
BUN: 5 mg/dL — ABNORMAL LOW (ref 8–23)
CO2: 27 mmol/L (ref 22–32)
Calcium: 10.1 mg/dL (ref 8.9–10.3)
Chloride: 94 mmol/L — ABNORMAL LOW (ref 98–111)
Creatinine, Ser: 0.52 mg/dL (ref 0.44–1.00)
GFR calc Af Amer: >60 mL/min (ref >60)
GFR calc non Af Amer: >60 mL/min (ref >60)
Glucose, Bld: 92 mg/dL (ref 70–99)
Potassium: 4.6 mmol/L (ref 3.5–5.1)
Sodium: 132 mmol/L — ABNORMAL LOW (ref 135–145)
Total Bilirubin: 1.8 mg/dL — ABNORMAL HIGH (ref 0.3–1.2)
Total Protein: 8.1 g/dL (ref 6.5–8.1)

## 2019-01-05 LAB — CBC WITH DIFFERENTIAL/PLATELET
Abs Immature Granulocytes: 0.02 10*3/uL (ref 0.00–0.07)
Basophils Absolute: 0 10*3/uL (ref 0.0–0.1)
Basophils Relative: 1 %
Eosinophils Absolute: 0 10*3/uL (ref 0.0–0.5)
Eosinophils Relative: 0 %
HCT: 39.4 % (ref 36.0–46.0)
Hemoglobin: 13.8 g/dL (ref 12.0–15.0)
Immature Granulocytes: 1 %
Lymphocytes Relative: 16 %
Lymphs Abs: 0.6 10*3/uL — ABNORMAL LOW (ref 0.7–4.0)
MCH: 30.7 pg (ref 26.0–34.0)
MCHC: 35 g/dL (ref 30.0–36.0)
MCV: 87.8 fL (ref 80.0–100.0)
Monocytes Absolute: 0.8 10*3/uL (ref 0.1–1.0)
Monocytes Relative: 20 %
Neutro Abs: 2.5 10*3/uL (ref 1.7–7.7)
Neutrophils Relative %: 62 %
Platelets: 112 10*3/uL — ABNORMAL LOW (ref 150–400)
RBC: 4.49 MIL/uL (ref 3.87–5.11)
RDW: 14 % (ref 11.5–15.5)
WBC: 4 10*3/uL (ref 4.0–10.5)
nRBC: 0 % (ref 0.0–0.2)

## 2019-01-05 LAB — AMMONIA: Ammonia: 24 umol/L (ref 9–35)

## 2019-01-05 LAB — MAGNESIUM: Magnesium: 1.9 mg/dL (ref 1.7–2.4)

## 2019-01-05 LAB — ETHANOL: Alcohol, Ethyl (B): <10 mg/dL (ref <10)

## 2019-01-05 MED ORDER — FLUORESCEIN SODIUM 1 MG OP STRP
1.0000 | ORAL_STRIP | Freq: Once | OPHTHALMIC | Status: AC
  Administered 2019-01-05: 1 via OPHTHALMIC
  Filled 2019-01-05: qty 1

## 2019-01-05 MED ORDER — POLYMYXIN B-TRIMETHOPRIM 10000-0.1 UNIT/ML-% OP SOLN
1.0000 [drp] | OPHTHALMIC | Status: DC
  Administered 2019-01-05: 1 [drp] via OPHTHALMIC
  Filled 2019-01-05: qty 10

## 2019-01-05 MED ORDER — SODIUM CHLORIDE 0.9 % IV BOLUS
1000.0000 mL | Freq: Once | INTRAVENOUS | Status: AC
  Administered 2019-01-05: 1000 mL via INTRAVENOUS

## 2019-01-05 MED ORDER — AMOXICILLIN-POT CLAVULANATE 875-125 MG PO TABS: 1.0000 | ORAL_TABLET | Freq: Two times a day (BID) | ORAL | 0 refills | Status: DC

## 2019-01-05 MED ORDER — GATIFLOXACIN 0.5 % OP SOLN
1.0000 [drp] | OPHTHALMIC | Status: DC
  Administered 2019-01-05: 1 [drp] via OPHTHALMIC
  Filled 2019-01-05: qty 2.5

## 2019-01-05 MED ORDER — IOHEXOL 300 MG/ML  SOLN
75.0000 mL | Freq: Once | INTRAMUSCULAR | Status: AC | PRN
  Administered 2019-01-05: 75 mL via INTRAVENOUS

## 2019-01-05 MED ORDER — AMOXICILLIN-POT CLAVULANATE 875-125 MG PO TABS
1.0000 | ORAL_TABLET | Freq: Once | ORAL | Status: AC
  Administered 2019-01-05: 1 via ORAL
  Filled 2019-01-05: qty 1

## 2019-01-05 MED ORDER — TETRACAINE HCL 0.5 % OP SOLN
1.0000 [drp] | Freq: Once | OPHTHALMIC | Status: AC
  Administered 2019-01-05: 17:00:00 1 [drp] via OPHTHALMIC
  Filled 2019-01-05: qty 8

## 2019-01-05 NOTE — ED Notes (Signed)
[place] daughter wants phone call before pt is discharged.

## 2019-01-05 NOTE — Discharge Instructions (Signed)
Please use the antibiotic called Augmentin twice a day.  You have been given the first dose tonight, the next dose will be tomorrow morning  Please use the eyedrops that we have given you, use both drops, in the right eye every hour until you see the ophthalmologist  I have spoken with Dr. Katy Fitch, he is our on-call ophthalmologist to see you tomorrow morning at 9:00 in the office  You must go to the office to see this ophthalmologist, you are at significant risk of losing vision in your right eye permanently, there may be some improvement if you follow-up with the eye doctor however if you do not go to the office you will permanently lose your vision and may develop severe infection in the eye.  ER for worsening symptoms.  Your xrays look good - no new broken bones in your chest / sternum / ribs.

## 2019-01-05 NOTE — ED Provider Notes (Signed)
[facility] Provider Note   CSN: [id number] Arrival date & time: 01/05/19  1222    History   Chief Complaint Chief Complaint  Patient presents with  . Eye Problem  . Fall    HPI Gloria Santana is a 63 y.o. female.     HPI  The patient is a very pleasant 63 year old female who presents today with a complaint of right-sided eye pain discharge and redness as well as a complaint of mid chest pain after having multiple falls.  She reports that she drinks heavily and gets off balance when she drinks a lot.  She has had a couple of falls including a recent fall where she struck her chest.  She cannot give me the exact details of that injury but states it is been hurting her for several days.  When she went to her doctor's office this morning she was noted to have significant abnormalities to her right eye and now states that she has been losing her vision in that right eye for several days becoming more more cloudy, red and full of drainage.  She denies fevers, she has mild periorbital pain associated with this.  She is not sure if she struck her eye and 1 of the falls.  She has COPD, she has hypertension.  The patient reportedly had an x-ray done at her doctor's office this morning.  Past Medical History:  Diagnosis Date  . Alcohol dependence (HCC)   . COPD (chronic obstructive pulmonary disease) (HCC)   . Diabetes mellitus without complication (HCC)    Pt states she is not diabetic  . Hypertension   . Poor circulation     Patient Active Problem List   Diagnosis Date Noted  . Fatty liver 07/13/2018  . Encounter for screening colonoscopy 07/13/2018    Past Surgical History:  Procedure Laterality Date  . ABDOMINAL HYSTERECTOMY       OB History   No obstetric history on file.      Home Medications    Prior to Admission medications   Medication Sig Start Date End Date Taking? Authorizing Provider  folic acid (FOLVITE) 1 MG tablet Take 1 mg by  mouth daily.   Yes [provider]  Multiple Vitamin (MULTIVITAMIN WITH MINERALS) TABS tablet Take 1 tablet by mouth daily.   Yes [provider]  amoxicillin-clavulanate (AUGMENTIN) 875-125 MG tablet Take 1 tablet by mouth every 12 (twelve) hours. 01/05/19   Eber HongMiller, Alivea Gladson, MD  hydrocortisone cream 1 % Apply to affected area 2 times daily Patient not taking: Reported on 09/15/2018 09/05/18   Mesner, Barbara CowerJason, MD    Family History Family History  Problem Relation Age of Onset  . Diabetes Mother   . Colon cancer Neg Hx   . Colon polyps Neg Hx     Social History Social History   Tobacco Use  . Smoking status: Current Every Day Smoker    Packs/day: 2.00    Years: 35.00    Pack years: 70.00    Types: Cigarettes  . Smokeless tobacco: Never Used  Substance Use Topics  . Alcohol use: Yes    Alcohol/week: 1.0 standard drinks    Types: 1 Cans of beer per week  . Drug use: No     Allergies   Patient has no known allergies.   Review of Systems Review of Systems  All other systems reviewed and are negative.    Physical Exam Updated Vital Signs BP (!) 155/97   Pulse Marland Kitchen(!)  125   Temp 99.7 F (37.6 C) (Oral)   Resp 12   Ht 1.715 m (5' 7.5")   Wt 47.4 kg   SpO2 98%   BMI 16.11 kg/m   Physical Exam Vitals signs and nursing note reviewed.  Constitutional:      General: She is not in acute distress.    Appearance: She is well-developed.  HENT:     Head: Normocephalic and atraumatic.     Mouth/Throat:     Pharynx: No oropharyngeal exudate.  Eyes:     General: No scleral icterus.       Right eye: Discharge present.        Left eye: No discharge.     Comments: The patient is very abnormal appearing right eye with diffuse conjunctival injection, the cornea is globally mildly opacified but there does appear to be central significant sized ulceration.  The pupil is not significantly visualized on the right side, the left side appears normal with normal pupillary  reflex.  Her extraocular movements are completely intact, there is no proptosis, she does have mild tenderness of the upper and lower lid but no visible swelling redness.  There is some purulent drainage from the right eye.  Neck:     Musculoskeletal: Normal range of motion and neck supple.     Thyroid: No thyromegaly.     Vascular: No JVD.  Cardiovascular:     Rate and Rhythm: Regular rhythm. Tachycardia present.     Heart sounds: Normal heart sounds. No murmur. No friction rub. No gallop.   Pulmonary:     Effort: Pulmonary effort is normal. No respiratory distress.     Breath sounds: Normal breath sounds. No wheezing or rales.     Comments: There is tenderness over the mid chest over the sternum.  There is no tenderness laterally.  Compression of the chest both laterally and anterior posteriorly does not cause significant pain but there is point tenderness over the mid sternum.  Lung sounds are clear bilaterally Chest:     Chest wall: Tenderness present.  Abdominal:     General: Bowel sounds are normal. There is no distension.     Palpations: Abdomen is soft. There is no mass.     Tenderness: There is no abdominal tenderness.  Musculoskeletal: Normal range of motion.        General: No tenderness.     Right lower leg: No edema.     Left lower leg: No edema.     Comments: The patient has full range of motion of all 4 extremities with normal joints, normal compartments.  Lymphadenopathy:     Cervical: No cervical adenopathy.  Skin:    General: Skin is warm and dry.     Findings: No erythema or rash.  Neurological:     Mental Status: She is alert.     Coordination: Coordination normal.  Psychiatric:        Behavior: Behavior normal.      ED Treatments / Results  Labs (all labs ordered are listed, but only abnormal results are displayed) Labs Reviewed  COMPREHENSIVE METABOLIC PANEL - Abnormal; Notable for the following components:      Result Value   Sodium 132 (*)     Chloride 94 (*)    BUN 5 (*)    AST 142 (*)    ALT 55 (*)    Alkaline Phosphatase 156 (*)    Total Bilirubin 1.8 (*)    All other components within  normal limits  CBC WITH DIFFERENTIAL/PLATELET - Abnormal; Notable for the following components:   Platelets 112 (*)    Lymphs Abs 0.6 (*)    All other components within normal limits  MAGNESIUM  ETHANOL  AMMONIA    EKG EKG Interpretation  Date/Time:  Thursday January 05 2019 16:49:28 EDT Ventricular Rate:  97 PR Interval:    QRS Duration: 82 QT Interval:  375 QTC Calculation: 477 R Axis:   59 Text Interpretation:  Sinus rhythm Borderline low voltage, extremity leads Baseline wander in lead(s) III since last tracing no significant change Confirmed by Eber HongMiller, Ahsley Attwood ([id number]) on 01/05/2019 4:52:49 PM   Radiology Dg Chest 2 View  Result Date: 01/05/2019 CLINICAL DATA:  Chest wall pain. Cough. EXAM: CHEST - 2 VIEW COMPARISON:  04/06/2018 FINDINGS: The heart size and mediastinal contours are within normal limits. Both lungs are clear. Old healed left rib fractures. The lungs are somewhat hyperinflated. No effusions. IMPRESSION: Slight hyperinflation of the lungs. Otherwise, normal exam. Electronically Signed   By: Francene BoyersJames  Maxwell M.D.   On: 01/05/2019 16:39   Dg Sternum  Result Date: 01/05/2019 CLINICAL DATA:  Fall 1 week ago with anterior chest pain, initial encounter EXAM: STERNUM - 2+ VIEW COMPARISON:  Chest x-ray from earlier in the same day. FINDINGS: Cardiac shadow is stable. The lungs are again hyperinflated. Old rib fractures are seen. Nipple shadows are noted bilaterally. No acute sternal fracture is noted. IMPRESSION: No acute abnormality noted. Electronically Signed   By: Alcide CleverMark  Lukens M.D.   On: 01/05/2019 19:03   Ct Head Wo Contrast  Result Date: 01/05/2019 CLINICAL DATA:  63 year old female with ataxia and head trauma. EXAM: CT HEAD WITHOUT CONTRAST TECHNIQUE: Contiguous axial images were obtained from the base of the skull through  the vertex without intravenous contrast. COMPARISON:  Head CT dated 11/17/2018 and orbital CT dated 01/05/2019 FINDINGS: Brain: There is mild age-related atrophy and chronic microvascular ischemic changes. There is no acute intracranial hemorrhage. No mass effect or midline shift. No extra-axial fluid collection. Vascular: No hyperdense vessel or unexpected calcification. Skull: Normal. Negative for fracture or focal lesion. Sinuses/Orbits: Mild mucoperiosteal thickening of paranasal sinuses. No air-fluid level. The mastoid air cells are clear. Other: Right periorbital soft tissue thickening with small pockets of air along the inferior aspect of the right globe. Please see report for CT of the orbit. IMPRESSION: 1. No acute intracranial hemorrhage. 2. Mild age-related atrophy and chronic microvascular ischemic changes. 3. Right periorbital soft tissue swelling with small foci of air inferior to the right globe. See report for CT of the orbit. Electronically Signed   By: Elgie CollardArash  Radparvar M.D.   On: 01/05/2019 19:12   Ct Orbits W Contrast  Result Date: 01/05/2019 CLINICAL DATA:  Head trauma and vision loss EXAM: CT ORBITS WITH CONTRAST TECHNIQUE: Multidetector CT images was performed according to the standard protocol following intravenous contrast administration. CONTRAST:  75mL OMNIPAQUE IOHEXOL 300 MG/ML  SOLN COMPARISON:  None. FINDINGS: Orbits: --Globes: Normal. --Bony orbit: Normal. --Preseptal soft tissues: There is hyperdense soft tissue swelling adjacent to the right lacrimal gland. --Intra- and extraconal orbital fat: Normal. No inflammatory stranding. --Optic nerves: Normal. --Lacrimal glands and fossae: There is soft tissue swelling at the lateral aspect of the right lacrimal fossa. --Extraocular muscles: Normal. Visualized sinuses:  No fluid levels or advanced mucosal thickening. Soft tissues: Normal. Limited intracranial: Normal. IMPRESSION: 1. Soft tissue swelling in hyperdensity at the lateral  aspect of the right lacrimal fossa. With a history  of trauma, this is most likely simply a periorbital hematoma. If this is a more long-standing issue, orbital lymphoma is a potential cause of lacrimal gland enlargement. 2. No postseptal abnormality or other evidence of orbital cellulitis. Electronically Signed   By: Kevin  Herman Deatra Robinson 01/05/2019 19:19    Procedures Procedures (including critical care time)  Medications Ordered in ED Medications  gatifloxacin (ZYMAXID) 0.5 % ophthalmic drops 1 drop (1 drop Right Eye Given 01/05/19 1802)  trimethoprim-polymyxin b (POLYTRIM) ophthalmic solution 1 drop (1 drop Right Eye Given 01/05/19 1803)  amoxicillin-clavulanate (AUGMENTIN) 875-125 MG per tablet 1 tablet (has no administration in time range)  tetracaine (PONTOCAINE) 0.5 % ophthalmic solution 1 drop (1 drop Both Eyes Given by Other 01/05/19 1726)  fluorescein ophthalmic strip 1 strip (1 strip Both Eyes Given 01/05/19 1726)  sodium chloride 0.9 % bolus 1,000 mL (0 mLs Intravenous Stopped 01/05/19 1835)  iohexol (OMNIPAQUE) 300 MG/ML solution 75 mL (75 mLs Intravenous Contrast Given 01/05/19 1825)     Initial Impression / Assessment and Plan / ED Course  I have reviewed the triage vital signs and the nursing notes.  Pertinent labs & imaging results that were available during my care of the patient were reviewed by me and considered in my medical decision making (see chart for details).  Clinical Course as of Jan 04 1941  Thu Jan 05, 2019  1721 Discussed with Dr. Dione Booze - will see at 9 AM tomorrow - agrees with topical vigamox and polytrim q hour until she goes in tomorrow morning - agrees this is not endopthalmitis most likely - CT a nd lab work pending, tachycarida has resolved   [BM]  1912 I have personally looked at the x-ray of the chest, no acute findings, there are old rib fractures there.  I have viewed the CT scan of the brain as well as the orbits and see no signs of retro-orbital  pathology or brain pathology.  The patient will also be given oral antibiotics prior to discharge.  I have reviewed her lab work and though there is some liver abnormalities consistent with alcohol abuse there is nothing that would require admission to the hospital including significant electrolyte abnormalities etc.   [BM]  1934 I have reviewed the radiologist reports on the CT scans including looking at the images myself.  I agree with the recommendations and will be given antibiotics as well as the drops for home.  The patient has follow-up in the morning with Dr. Dione Booze, I appreciate his willingness to care for the patient   [BM]    Clinical Course User Index [BM] Eber Hong, MD           I am concerned that this patient has what appears to be a corneal ulcer, she may have some element of infection as well with her conjunctivitis or endophthalmitis there is definitely some concern and will need to discuss with ophthalmology.  At this time the patient will go over for an x-ray of the sternum, will also obtain CT scan of the brain and the orbits to rule out intraorbital abnormalities, foreign bodies or retro-orbital pathology.  Will check labs, alcohol level, metabolic panel and she may need some fluids due to her tachycardia.  I suspect she is relatively dehydrated.  The patient is agreeable to the plan  I personally discussed the case with the patient's daughter, Boyd Kerbs, she is waiting for the patient outside the hospital and is agreeable to the entire plan  including taking her mother to the eye doctor tomorrow.  At this time the patient is no longer tachycardic, she is not febrile, she has been given both eyedrops and oral Augmentin and at this time is stable for discharge.  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

## 2019-01-05 NOTE — ED Triage Notes (Signed)
Pt reports right eye has been swollen, red and draining for 3 days. Also reports falling last week and landed on chest  Which now hurts when she coughs. States she fell when she was trying to go to [place]. Pt has COPd and chronic congested cough

## 2019-01-06 ENCOUNTER — Other Ambulatory Visit (HOSPITAL_COMMUNITY)
Admission: RE | Admit: 2019-01-06 | Discharge: 2019-01-06 | Disposition: A | Discharge status: Home / Self Care | Payer: Medicaid Other | Source: Ambulatory Visit | Attending: Gastroenterology | Admitting: Gastroenterology

## 2019-01-09 ENCOUNTER — Telehealth: Payer: Self-pay

## 2019-01-09 NOTE — Telephone Encounter (Signed)
Pre-op appt 03/31/19 at 10:00am. Letter mailed with procedure instructions.

## 2019-01-09 NOTE — Telephone Encounter (Signed)
Melanie at [facility] called office, pt was no show for COVID test 01/06/19.  Spoke to pt's daughter Kieth Brightly), she was going to call office today to reschedule TCS/-/+EGD w/Propofol w/SLF. Pt has infection in both eyes. Procedure rescheduled to 04/03/19 at 2:00pm. She already has TCS prep. LMOVM for endo scheduler.

## 2019-03-29 NOTE — Patient Instructions (Signed)
Gloria AlandKatie S Bambach  03/29/2019     @PREFPERIOPPHARMACY @   Your procedure is scheduled on  04/03/2019 .  [facility] at  1230  P.M.  Call this number if you have problems the morning of surgery:  [phone]   Remember:  Follow the diet and prep instructions given to you by Dr Evelina DunField's office.                     Take these medicines the morning of surgery with A SIP OF WATER  None    Do not wear jewelry, make-up or nail polish.  Do not wear lotions, powders, or perfumes. Please wear deodorant and brush your teeth.  Do not shave 48 hours prior to surgery.  Men may shave face and neck.  Do not bring valuables to the hospital.  [facility] is not responsible for any belongings or valuables.  Contacts, dentures or bridgework may not be worn into surgery.  Leave your suitcase in the car.  After surgery it may be brought to your room.  For patients admitted to the hospital, discharge time will be determined by your treatment team.  Patients discharged the day of surgery will not be allowed to drive home.   Name and phone number of your driver:   family Special instructions:  None  Please read over the following fact sheets that you were given. Anesthesia Post-op Instructions and Care and Recovery After Surgery       Upper Endoscopy, Adult, Care After This sheet gives you information about how to care for yourself after your procedure. Your health care provider may also give you more specific instructions. If you have problems or questions, contact your health care provider. What can I expect after the procedure? After the procedure, it is common to have:  A sore throat.  Mild stomach pain or discomfort.  Bloating.  Nausea. Follow these instructions at home:   Follow instructions from your health care provider about what to eat or drink after your procedure.  Return to your normal activities as told by your health care provider. Ask your health  care provider what activities are safe for you.  Take over-the-counter and prescription medicines only as told by your health care provider.  Do not drive for 24 hours if you were given a sedative during your procedure.  Keep all follow-up visits as told by your health care provider. This is important. Contact a health care provider if you have:  A sore throat that lasts longer than one day.  Trouble swallowing. Get help right away if:  You vomit blood or your vomit looks like coffee grounds.  You have: ? A fever. ? Bloody, black, or tarry stools. ? A severe sore throat or you cannot swallow. ? Difficulty breathing. ? Severe pain in your chest or abdomen. Summary  After the procedure, it is common to have a sore throat, mild stomach discomfort, bloating, and nausea.  Do not drive for 24 hours if you were given a sedative during the procedure.  Follow instructions from your health care provider about what to eat or drink after your procedure.  Return to your normal activities as told by your health care provider. This information is not intended to replace advice given to you by your health care provider. Make sure you discuss any questions you have with your health care provider. Document Released: 12/01/2011 Document Revised: 11/23/2017 Document Reviewed: 11/01/2017  Elsevier Patient Education  [employer].  Colonoscopy, Adult, Care After This sheet gives you information about how to care for yourself after your procedure. Your health care provider may also give you more specific instructions. If you have problems or questions, contact your health care provider. What can I expect after the procedure? After the procedure, it is common to have:  A small amount of blood in your stool for 24 hours after the procedure.  Some gas.  Mild abdominal cramping or bloating. Follow these instructions at home: General instructions  For the first 24 hours after the procedure:  ? Do not drive or use machinery. ? Do not sign important documents. ? Do not drink alcohol. ? Do your regular daily activities at a slower pace than normal. ? Eat soft, easy-to-digest foods.  Take over-the-counter or prescription medicines only as told by your health care provider. Relieving cramping and bloating   Try walking around when you have cramps or feel bloated.  Apply heat to your abdomen as told by your health care provider. Use a heat source that your health care provider recommends, such as a moist heat pack or a heating pad. ? Place a towel between your skin and the heat source. ? Leave the heat on for 20-30 minutes. ? Remove the heat if your skin turns bright red. This is especially important if you are unable to feel pain, heat, or cold. You may have a greater risk of getting burned. Eating and drinking   Drink enough fluid to keep your urine pale yellow.  Resume your normal diet as instructed by your health care provider. Avoid heavy or fried foods that are hard to digest.  Avoid drinking alcohol for as long as instructed by your health care provider. Contact a health care provider if:  You have blood in your stool 2-3 days after the procedure. Get help right away if:  You have more than a small spotting of blood in your stool.  You pass large blood clots in your stool.  Your abdomen is swollen.  You have nausea or vomiting.  You have a fever.  You have increasing abdominal pain that is not relieved with medicine. Summary  After the procedure, it is common to have a small amount of blood in your stool. You may also have mild abdominal cramping and bloating.  For the first 24 hours after the procedure, do not drive or use machinery, sign important documents, or drink alcohol.  Contact your health care provider if you have a lot of blood in your stool, nausea or vomiting, a fever, or increased abdominal pain. This information is not intended to replace  advice given to you by your health care provider. Make sure you discuss any questions you have with your health care provider. Document Released: 01/14/2004 Document Revised: 03/24/2017 Document Reviewed: 08/13/2015 Elsevier Patient Education  2020 [employer] Monitored Anesthesia Care, Care After These instructions provide you with information about caring for yourself after your procedure. Your health care provider may also give you more specific instructions. Your treatment has been planned according to current medical practices, but problems sometimes occur. Call your health care provider if you have any problems or questions after your procedure. What can I expect after the procedure? After your procedure, you may:  Feel sleepy for several hours.  Feel clumsy and have poor balance for several hours.  Feel forgetful about what happened after the procedure.  Have poor judgment for several hours.  Feel  nauseous or vomit.  Have a sore throat if you had a breathing tube during the procedure. Follow these instructions at home: For at least 24 hours after the procedure:      Have a responsible adult stay with you. It is important to have someone help care for you until you are awake and alert.  Rest as needed.  Do not: ? Participate in activities in which you could fall or become injured. ? Drive. ? Use heavy machinery. ? Drink alcohol. ? Take sleeping pills or medicines that cause drowsiness. ? Make important decisions or sign legal documents. ? Take care of children on your own. Eating and drinking  Follow the diet that is recommended by your health care provider.  If you vomit, drink water, juice, or soup when you can drink without vomiting.  Make sure you have little or no nausea before eating solid foods. General instructions  Take over-the-counter and prescription medicines only as told by your health care provider.  If you have sleep apnea, surgery and certain  medicines can increase your risk for breathing problems. Follow instructions from your health care provider about wearing your sleep device: ? Anytime you are sleeping, including during daytime naps. ? While taking prescription pain medicines, sleeping medicines, or medicines that make you drowsy.  If you smoke, do not smoke without supervision.  Keep all follow-up visits as told by your health care provider. This is important. Contact a health care provider if:  You keep feeling nauseous or you keep vomiting.  You feel light-headed.  You develop a rash.  You have a fever. Get help right away if:  You have trouble breathing. Summary  For several hours after your procedure, you may feel sleepy and have poor judgment.  Have a responsible adult stay with you for at least 24 hours or until you are awake and alert. This information is not intended to replace advice given to you by your health care provider. Make sure you discuss any questions you have with your health care provider. Document Released: 09/22/2015 Document Revised: 08/30/2017 Document Reviewed: 09/22/2015 Elsevier Patient Education  2020 [employer].

## 2019-03-31 ENCOUNTER — Other Ambulatory Visit: Payer: Self-pay

## 2019-03-31 ENCOUNTER — Encounter (HOSPITAL_COMMUNITY): Payer: Self-pay

## 2019-03-31 ENCOUNTER — Other Ambulatory Visit (HOSPITAL_COMMUNITY)
Admission: RE | Admit: 2019-03-31 | Discharge: 2019-03-31 | Disposition: A | Discharge status: Home / Self Care | Payer: Medicaid Other | Source: Ambulatory Visit | Attending: Gastroenterology | Admitting: Gastroenterology

## 2019-03-31 ENCOUNTER — Encounter (HOSPITAL_COMMUNITY)
Admission: RE | Admit: 2019-03-31 | Discharge: 2019-03-31 | Disposition: A | Discharge status: Home / Self Care | Payer: Medicaid Other | Source: Ambulatory Visit | Attending: Gastroenterology | Admitting: Gastroenterology

## 2019-03-31 DIAGNOSIS — Z20828 Contact with and (suspected) exposure to other viral communicable diseases: Secondary | ICD-10-CM | POA: Diagnosis not present

## 2019-03-31 DIAGNOSIS — Z01812 Encounter for preprocedural laboratory examination: Secondary | ICD-10-CM | POA: Diagnosis not present

## 2019-03-31 HISTORY — DX: Anemia, unspecified: D64.9

## 2019-03-31 LAB — CBC WITH DIFFERENTIAL/PLATELET
Abs Immature Granulocytes: 0 10*3/uL (ref 0.00–0.07)
Basophils Absolute: 0 10*3/uL (ref 0.0–0.1)
Basophils Relative: 1 %
Eosinophils Absolute: 0.1 10*3/uL (ref 0.0–0.5)
Eosinophils Relative: 4 %
HCT: 36.4 % (ref 36.0–46.0)
Hemoglobin: 11.9 g/dL — ABNORMAL LOW (ref 12.0–15.0)
Immature Granulocytes: 0 %
Lymphocytes Relative: 29 %
Lymphs Abs: 0.6 10*3/uL — ABNORMAL LOW (ref 0.7–4.0)
MCH: 29.4 pg (ref 26.0–34.0)
MCHC: 32.7 g/dL (ref 30.0–36.0)
MCV: 89.9 fL (ref 80.0–100.0)
Monocytes Absolute: 0.4 10*3/uL (ref 0.1–1.0)
Monocytes Relative: 21 %
Neutro Abs: 0.9 10*3/uL — ABNORMAL LOW (ref 1.7–7.7)
Neutrophils Relative %: 45 %
Platelets: 107 10*3/uL — ABNORMAL LOW (ref 150–400)
RBC: 4.05 MIL/uL (ref 3.87–5.11)
RDW: 15.4 % (ref 11.5–15.5)
WBC: 2 10*3/uL — ABNORMAL LOW (ref 4.0–10.5)
nRBC: 0 % (ref 0.0–0.2)

## 2019-03-31 LAB — COMPREHENSIVE METABOLIC PANEL
ALT: 34 U/L (ref 0–44)
AST: 77 U/L — ABNORMAL HIGH (ref 15–41)
Albumin: 4.2 g/dL (ref 3.5–5.0)
Alkaline Phosphatase: 106 U/L (ref 38–126)
Anion gap: 13 (ref 5–15)
BUN: 7 mg/dL — ABNORMAL LOW (ref 8–23)
CO2: 22 mmol/L (ref 22–32)
Calcium: 9.3 mg/dL (ref 8.9–10.3)
Chloride: 100 mmol/L (ref 98–111)
Creatinine, Ser: 0.48 mg/dL (ref 0.44–1.00)
GFR calc Af Amer: >60 mL/min (ref >60)
GFR calc non Af Amer: >60 mL/min (ref >60)
Glucose, Bld: 77 mg/dL (ref 70–99)
Potassium: 4 mmol/L (ref 3.5–5.1)
Sodium: 135 mmol/L (ref 135–145)
Total Bilirubin: 0.5 mg/dL (ref 0.3–1.2)
Total Protein: 7.5 g/dL (ref 6.5–8.1)

## 2019-03-31 LAB — SARS CORONAVIRUS 2 (TAT 6-24 HRS): SARS Coronavirus 2: NEGATIVE

## 2019-04-03 ENCOUNTER — Telehealth: Payer: Self-pay

## 2019-04-03 ENCOUNTER — Other Ambulatory Visit: Payer: Self-pay

## 2019-04-03 MED ORDER — CLENPIQ 10-3.5-12 MG-GM -GM/160ML PO SOLN: 1.0000 | Freq: Once | ORAL | 0 refills | Status: AC

## 2019-04-03 NOTE — Progress Notes (Signed)
CC'D TO PCP °

## 2019-04-03 NOTE — Telephone Encounter (Signed)
Daughter called after office closed 03/31/19, said prep wasn't at the pharmacy. Rx was sent in July. TCS/EGD w/Propofol was scheduled for today.  Called daughter, pt was unable to get prep. Rescheduled procedure to 06/27/19 at 12:30pm. Endo scheduler informed. Pre-op 06/22/19 at 12:45pm. COVID test 06/23/19 at 11:45am. Appt letters mailed with new procedure instructions.

## 2019-04-21 ENCOUNTER — Telehealth: Payer: Self-pay | Admitting: Gastroenterology

## 2019-04-21 MED ORDER — CLENPIQ 10-3.5-12 MG-GM -GM/160ML PO SOLN: 1.0000 | Freq: Once | ORAL | 0 refills | Status: AC

## 2019-04-21 NOTE — Telephone Encounter (Signed)
Rx resent.

## 2019-04-21 NOTE — Addendum Note (Signed)
Addended by: Zara Council C on: 04/21/2019 11:26 AM   Modules accepted: Orders

## 2019-04-21 NOTE — Telephone Encounter (Signed)
Patient daughter called and said that her pharmacy does not have her prep-please resend to [facility] on [address]

## 2019-06-21 NOTE — Patient Instructions (Signed)
INFINITI HOEFLING  06/21/2019     @PREFPERIOPPHARMACY @   Your procedure is scheduled on  06/27/2019   Report to [facility] at  1045 A.M.  Call this number if you have problems the morning of surgery:  [phone]   Remember:  Follow the diet and prep instructions given to you by Dr [phone] office.                       Take these medicines the morning of surgery with A SIP OF WATER  None. Use your inhaler before you come.    Do not wear jewelry, make-up or nail polish.  Do not wear lotions, powders, or perfumes. Please wear deodorant and brush your teeth.  Do not shave 48 hours prior to surgery.  Men may shave face and neck.  Do not bring valuables to the hospital.  [facility] is not responsible for any belongings or valuables.  Contacts, dentures or bridgework may not be worn into surgery.  Leave your suitcase in the car.  After surgery it may be brought to your room.  For patients admitted to the hospital, discharge time will be determined by your treatment team.  Patients discharged the day of surgery will not be allowed to drive home.   Name and phone number of your driver:   family Special instructions:  DO NOT take any medications for diabetes the morning of your procedure.  Please read over the following fact sheets that you were given. Anesthesia Post-op Instructions and Care and Recovery After Surgery       Upper Endoscopy, Adult, Care After This sheet gives you information about how to care for yourself after your procedure. Your health care provider may also give you more specific instructions. If you have problems or questions, contact your health care provider. What can I expect after the procedure? After the procedure, it is common to have:  A sore throat.  Mild stomach pain or discomfort.  Bloating.  Nausea. Follow these instructions at home:   Follow instructions from your health care provider about what to eat or drink after your  procedure.  Return to your normal activities as told by your health care provider. Ask your health care provider what activities are safe for you.  Take over-the-counter and prescription medicines only as told by your health care provider.  Do not drive for 24 hours if you were given a sedative during your procedure.  Keep all follow-up visits as told by your health care provider. This is important. Contact a health care provider if you have:  A sore throat that lasts longer than one day.  Trouble swallowing. Get help right away if:  You vomit blood or your vomit looks like coffee grounds.  You have: ? A fever. ? Bloody, black, or tarry stools. ? A severe sore throat or you cannot swallow. ? Difficulty breathing. ? Severe pain in your chest or abdomen. Summary  After the procedure, it is common to have a sore throat, mild stomach discomfort, bloating, and nausea.  Do not drive for 24 hours if you were given a sedative during the procedure.  Follow instructions from your health care provider about what to eat or drink after your procedure.  Return to your normal activities as told by your health care provider. This information is not intended to replace advice given to you by your health care provider. Make sure you discuss  any questions you have with your health care provider. Document Revised: 11/23/2017 Document Reviewed: 11/01/2017 Elsevier Patient Education  [place].  Colonoscopy, Adult, Care After This sheet gives you information about how to care for yourself after your procedure. Your health care provider may also give you more specific instructions. If you have problems or questions, contact your health care provider. What can I expect after the procedure? After the procedure, it is common to have:  A small amount of blood in your stool for 24 hours after the procedure.  Some gas.  Mild cramping or bloating of your abdomen. Follow these instructions  at home: Eating and drinking   Drink enough fluid to keep your urine pale yellow.  Follow instructions from your health care provider about eating or drinking restrictions.  Resume your normal diet as instructed by your health care provider. Avoid heavy or fried foods that are hard to digest. Activity  Rest as told by your health care provider.  Avoid sitting for a long time without moving. Get up to take short walks every 1-2 hours. This is important to improve blood flow and breathing. Ask for help if you feel weak or unsteady.  Return to your normal activities as told by your health care provider. Ask your health care provider what activities are safe for you. Managing cramping and bloating   Try walking around when you have cramps or feel bloated.  Apply heat to your abdomen as told by your health care provider. Use the heat source that your health care provider recommends, such as a moist heat pack or a heating pad. ? Place a towel between your skin and the heat source. ? Leave the heat on for 20-30 minutes. ? Remove the heat if your skin turns bright red. This is especially important if you are unable to feel pain, heat, or cold. You may have a greater risk of getting burned. General instructions  For the first 24 hours after the procedure: ? Do not drive or use machinery. ? Do not sign important documents. ? Do not drink alcohol. ? Do your regular daily activities at a slower pace than normal. ? Eat soft foods that are easy to digest.  Take over-the-counter and prescription medicines only as told by your health care provider.  Keep all follow-up visits as told by your health care provider. This is important. Contact a health care provider if:  You have blood in your stool 2-3 days after the procedure. Get help right away if you have:  More than a small spotting of blood in your stool.  Large blood clots in your stool.  Swelling of your abdomen.  Nausea or  vomiting.  A fever.  Increasing pain in your abdomen that is not relieved with medicine. Summary  After the procedure, it is common to have a small amount of blood in your stool. You may also have mild cramping and bloating of your abdomen.  For the first 24 hours after the procedure, do not drive or use machinery, sign important documents, or drink alcohol.  Get help right away if you have a lot of blood in your stool, nausea or vomiting, a fever, or increased pain in your abdomen. This information is not intended to replace advice given to you by your health care provider. Make sure you discuss any questions you have with your health care provider. Document Revised: 12/26/2018 Document Reviewed: 12/26/2018 Elsevier Patient Education  [place], Care After  These instructions provide you with information about caring for yourself after your procedure. Your health care provider may also give you more specific instructions. Your treatment has been planned according to current medical practices, but problems sometimes occur. Call your health care provider if you have any problems or questions after your procedure. What can I expect after the procedure? After your procedure, you may:  Feel sleepy for several hours.  Feel clumsy and have poor balance for several hours.  Feel forgetful about what happened after the procedure.  Have poor judgment for several hours.  Feel nauseous or vomit.  Have a sore throat if you had a breathing tube during the procedure. Follow these instructions at home: For at least 24 hours after the procedure:      Have a responsible adult stay with you. It is important to have someone help care for you until you are awake and alert.  Rest as needed.  Do not: ? Participate in activities in which you could fall or become injured. ? Drive. ? Use heavy machinery. ? Drink alcohol. ? Take sleeping pills or medicines that  cause drowsiness. ? Make important decisions or sign legal documents. ? Take care of children on your own. Eating and drinking  Follow the diet that is recommended by your health care provider.  If you vomit, drink water, juice, or soup when you can drink without vomiting.  Make sure you have little or no nausea before eating solid foods. General instructions  Take over-the-counter and prescription medicines only as told by your health care provider.  If you have sleep apnea, surgery and certain medicines can increase your risk for breathing problems. Follow instructions from your health care provider about wearing your sleep device: ? Anytime you are sleeping, including during daytime naps. ? While taking prescription pain medicines, sleeping medicines, or medicines that make you drowsy.  If you smoke, do not smoke without supervision.  Keep all follow-up visits as told by your health care provider. This is important. Contact a health care provider if:  You keep feeling nauseous or you keep vomiting.  You feel light-headed.  You develop a rash.  You have a fever. Get help right away if:  You have trouble breathing. Summary  For several hours after your procedure, you may feel sleepy and have poor judgment.  Have a responsible adult stay with you for at least 24 hours or until you are awake and alert. This information is not intended to replace advice given to you by your health care provider. Make sure you discuss any questions you have with your health care provider. Document Revised: 08/30/2017 Document Reviewed: 09/22/2015 Elsevier Patient Education  [place].

## 2019-06-22 ENCOUNTER — Telehealth: Payer: Self-pay | Admitting: Gastroenterology

## 2019-06-22 ENCOUNTER — Encounter (HOSPITAL_COMMUNITY)
Admission: RE | Admit: 2019-06-22 | Discharge: 2019-06-22 | Disposition: A | Discharge status: Home / Self Care | Payer: Medicaid Other | Source: Ambulatory Visit | Attending: Gastroenterology | Admitting: Gastroenterology

## 2019-06-22 ENCOUNTER — Encounter (HOSPITAL_COMMUNITY): Payer: Self-pay

## 2019-06-22 NOTE — Pre-Procedure Instructions (Signed)
Patient was no show for PAT. Mindy @ [facility] notified.

## 2019-06-22 NOTE — Telephone Encounter (Signed)
Pt wants to cancel procedure. [phone]

## 2019-06-22 NOTE — Telephone Encounter (Signed)
Spoke with patient. She is refusing to have procedures done. Daughter got on the phone and she states patient is refusing to have anything done right now. She refused to go for pre-op and refused covid testing. Called endo-[facility] to cancel FYI to AB

## 2019-06-23 ENCOUNTER — Other Ambulatory Visit (HOSPITAL_COMMUNITY): Payer: Medicaid Other

## 2019-06-27 ENCOUNTER — Ambulatory Visit (HOSPITAL_COMMUNITY): Admission: RE | Admit: 2019-06-27 | Payer: Medicaid Other | Source: Home / Self Care | Admitting: Gastroenterology

## 2019-06-27 ENCOUNTER — Encounter (HOSPITAL_COMMUNITY): Admission: RE | Payer: Self-pay | Source: Home / Self Care

## 2019-06-27 SURGERY — COLONOSCOPY WITH PROPOFOL
Anesthesia: Monitor Anesthesia Care

## 2019-09-11 ENCOUNTER — Telehealth: Payer: Self-pay | Admitting: Nurse Practitioner

## 2019-09-11 NOTE — Telephone Encounter (Signed)
Patient needs OV to r/s. Last OV 06/2018

## 2019-09-11 NOTE — Telephone Encounter (Signed)
[phone]  patient daughter called to now schedule her mother for a procedure, does she needs another office visit?

## 2019-09-12 ENCOUNTER — Emergency Department (HOSPITAL_COMMUNITY): Payer: Medicaid Other

## 2019-09-12 ENCOUNTER — Other Ambulatory Visit: Payer: Self-pay

## 2019-09-12 ENCOUNTER — Encounter (HOSPITAL_COMMUNITY): Payer: Self-pay | Admitting: *Deleted

## 2019-09-12 ENCOUNTER — Inpatient Hospital Stay (HOSPITAL_COMMUNITY)
Admission: EM | Admit: 2019-09-12 | Discharge: 2019-09-16 | DRG: 163 | Disposition: A | Discharge status: Home / Self Care | Payer: Medicaid Other | Source: Ambulatory Visit | Attending: Internal Medicine | Admitting: Internal Medicine

## 2019-09-12 DIAGNOSIS — F17210 Nicotine dependence, cigarettes, uncomplicated: Secondary | ICD-10-CM | POA: Diagnosis present

## 2019-09-12 DIAGNOSIS — J90 Pleural effusion, not elsewhere classified: Secondary | ICD-10-CM | POA: Diagnosis present

## 2019-09-12 DIAGNOSIS — K8020 Calculus of gallbladder without cholecystitis without obstruction: Secondary | ICD-10-CM | POA: Diagnosis present

## 2019-09-12 DIAGNOSIS — Z09 Encounter for follow-up examination after completed treatment for conditions other than malignant neoplasm: Secondary | ICD-10-CM

## 2019-09-12 DIAGNOSIS — I10 Essential (primary) hypertension: Secondary | ICD-10-CM | POA: Diagnosis present

## 2019-09-12 DIAGNOSIS — J942 Hemothorax: Secondary | ICD-10-CM

## 2019-09-12 DIAGNOSIS — E119 Type 2 diabetes mellitus without complications: Secondary | ICD-10-CM | POA: Diagnosis present

## 2019-09-12 DIAGNOSIS — S2239XA Fracture of one rib, unspecified side, initial encounter for closed fracture: Secondary | ICD-10-CM

## 2019-09-12 DIAGNOSIS — D696 Thrombocytopenia, unspecified: Secondary | ICD-10-CM | POA: Diagnosis present

## 2019-09-12 DIAGNOSIS — J939 Pneumothorax, unspecified: Secondary | ICD-10-CM

## 2019-09-12 DIAGNOSIS — Z833 Family history of diabetes mellitus: Secondary | ICD-10-CM

## 2019-09-12 DIAGNOSIS — F329 Major depressive disorder, single episode, unspecified: Secondary | ICD-10-CM | POA: Diagnosis present

## 2019-09-12 DIAGNOSIS — D649 Anemia, unspecified: Secondary | ICD-10-CM | POA: Diagnosis present

## 2019-09-12 DIAGNOSIS — Z20822 Contact with and (suspected) exposure to covid-19: Secondary | ICD-10-CM | POA: Diagnosis present

## 2019-09-12 DIAGNOSIS — K861 Other chronic pancreatitis: Secondary | ICD-10-CM | POA: Diagnosis present

## 2019-09-12 DIAGNOSIS — E871 Hypo-osmolality and hyponatremia: Secondary | ICD-10-CM | POA: Diagnosis present

## 2019-09-12 DIAGNOSIS — Z681 Body mass index (BMI) 19 or less, adult: Secondary | ICD-10-CM

## 2019-09-12 DIAGNOSIS — Y92012 Bathroom of single-family (private) house as the place of occurrence of the external cause: Secondary | ICD-10-CM

## 2019-09-12 DIAGNOSIS — R000 Tachycardia, unspecified: Secondary | ICD-10-CM | POA: Diagnosis not present

## 2019-09-12 DIAGNOSIS — Z79899 Other long term (current) drug therapy: Secondary | ICD-10-CM

## 2019-09-12 DIAGNOSIS — S2242XA Multiple fractures of ribs, left side, initial encounter for closed fracture: Secondary | ICD-10-CM

## 2019-09-12 DIAGNOSIS — F1020 Alcohol dependence, uncomplicated: Secondary | ICD-10-CM | POA: Diagnosis present

## 2019-09-12 DIAGNOSIS — Z9689 Presence of other specified functional implants: Secondary | ICD-10-CM

## 2019-09-12 DIAGNOSIS — K760 Fatty (change of) liver, not elsewhere classified: Secondary | ICD-10-CM | POA: Diagnosis present

## 2019-09-12 DIAGNOSIS — K649 Unspecified hemorrhoids: Secondary | ICD-10-CM | POA: Diagnosis present

## 2019-09-12 DIAGNOSIS — S271XXA Traumatic hemothorax, initial encounter: Principal | ICD-10-CM | POA: Diagnosis present

## 2019-09-12 DIAGNOSIS — W19XXXA Unspecified fall, initial encounter: Secondary | ICD-10-CM | POA: Diagnosis present

## 2019-09-12 DIAGNOSIS — J449 Chronic obstructive pulmonary disease, unspecified: Secondary | ICD-10-CM | POA: Diagnosis present

## 2019-09-12 DIAGNOSIS — Z7982 Long term (current) use of aspirin: Secondary | ICD-10-CM

## 2019-09-12 DIAGNOSIS — E43 Unspecified severe protein-calorie malnutrition: Secondary | ICD-10-CM | POA: Diagnosis present

## 2019-09-12 LAB — COMPREHENSIVE METABOLIC PANEL
ALT: 21 U/L (ref 0–44)
AST: 65 U/L — ABNORMAL HIGH (ref 15–41)
Albumin: 3.8 g/dL (ref 3.5–5.0)
Alkaline Phosphatase: 132 U/L — ABNORMAL HIGH (ref 38–126)
Anion gap: 12 (ref 5–15)
BUN: 7 mg/dL — ABNORMAL LOW (ref 8–23)
CO2: 26 mmol/L (ref 22–32)
Calcium: 9.5 mg/dL (ref 8.9–10.3)
Chloride: 88 mmol/L — ABNORMAL LOW (ref 98–111)
Creatinine, Ser: 0.49 mg/dL (ref 0.44–1.00)
GFR calc Af Amer: >60 mL/min (ref >60)
GFR calc non Af Amer: >60 mL/min (ref >60)
Glucose, Bld: 96 mg/dL (ref 70–99)
Potassium: 3.6 mmol/L (ref 3.5–5.1)
Sodium: 126 mmol/L — ABNORMAL LOW (ref 135–145)
Total Bilirubin: 1.9 mg/dL — ABNORMAL HIGH (ref 0.3–1.2)
Total Protein: 7.6 g/dL (ref 6.5–8.1)

## 2019-09-12 LAB — URINALYSIS, ROUTINE W REFLEX MICROSCOPIC
Bilirubin Urine: NEGATIVE
Glucose, UA: NEGATIVE mg/dL
Hgb urine dipstick: NEGATIVE
Ketones, ur: NEGATIVE mg/dL
Leukocytes,Ua: NEGATIVE
Nitrite: NEGATIVE
Protein, ur: NEGATIVE mg/dL
Specific Gravity, Urine: 1.01 (ref 1.005–1.030)
pH: 7 (ref 5.0–8.0)

## 2019-09-12 LAB — CBC WITH DIFFERENTIAL/PLATELET
Abs Immature Granulocytes: 0.02 10*3/uL (ref 0.00–0.07)
Basophils Absolute: 0 10*3/uL (ref 0.0–0.1)
Basophils Relative: 1 %
Eosinophils Absolute: 0.1 10*3/uL (ref 0.0–0.5)
Eosinophils Relative: 2 %
HCT: 34.1 % — ABNORMAL LOW (ref 36.0–46.0)
Hemoglobin: 11.4 g/dL — ABNORMAL LOW (ref 12.0–15.0)
Immature Granulocytes: 1 %
Lymphocytes Relative: 18 %
Lymphs Abs: 0.8 10*3/uL (ref 0.7–4.0)
MCH: 30.2 pg (ref 26.0–34.0)
MCHC: 33.4 g/dL (ref 30.0–36.0)
MCV: 90.5 fL (ref 80.0–100.0)
Monocytes Absolute: 0.8 10*3/uL (ref 0.1–1.0)
Monocytes Relative: 17 %
Neutro Abs: 2.7 10*3/uL (ref 1.7–7.7)
Neutrophils Relative %: 61 %
Platelets: 116 10*3/uL — ABNORMAL LOW (ref 150–400)
RBC: 3.77 MIL/uL — ABNORMAL LOW (ref 3.87–5.11)
RDW: 13.8 % (ref 11.5–15.5)
WBC: 4.4 10*3/uL (ref 4.0–10.5)
nRBC: 0 % (ref 0.0–0.2)

## 2019-09-12 LAB — POC OCCULT BLOOD, ED: Fecal Occult Bld: NEGATIVE

## 2019-09-12 LAB — LIPASE, BLOOD: Lipase: 40 U/L (ref 11–51)

## 2019-09-12 MED ORDER — IOHEXOL 9 MG/ML PO SOLN
ORAL | Status: AC
  Filled 2019-09-12: qty 1000

## 2019-09-12 MED ORDER — SODIUM CHLORIDE 0.9 % IV BOLUS
500.0000 mL | Freq: Once | INTRAVENOUS | Status: AC
  Administered 2019-09-12: 500 mL via INTRAVENOUS

## 2019-09-12 MED ORDER — PANTOPRAZOLE SODIUM 40 MG IV SOLR
40.0000 mg | Freq: Once | INTRAVENOUS | Status: AC
  Administered 2019-09-12: 40 mg via INTRAVENOUS
  Filled 2019-09-12: qty 40

## 2019-09-12 MED ORDER — FENTANYL CITRATE (PF) 100 MCG/2ML IJ SOLN
50.0000 ug | Freq: Once | INTRAMUSCULAR | Status: AC
  Administered 2019-09-12: 50 ug via INTRAVENOUS
  Filled 2019-09-12: qty 2

## 2019-09-12 MED ORDER — IOHEXOL 300 MG/ML  SOLN
75.0000 mL | Freq: Once | INTRAMUSCULAR | Status: AC | PRN
  Administered 2019-09-12: 75 mL via INTRAVENOUS

## 2019-09-12 NOTE — ED Notes (Signed)
Pt was informed that we need a urine sample. 

## 2019-09-12 NOTE — ED Provider Notes (Addendum)
Medical screening examination/treatment/procedure(s) were conducted as a shared visit with non-physician practitioner(s) and myself.  I personally evaluated the patient during the encounter.  DG Ribs Unilateral W/Chest Left  Result Date: 09/12/2019 CLINICAL DATA:  Left axillary rib pain, fell 2 weeks ago EXAM: LEFT RIBS AND CHEST - 3+ VIEW COMPARISON:  01/05/2019 FINDINGS: Frontal and oblique views of the left thoracic cage are obtained. There are minimally displaced left lateral ninth and tenth rib fractures. Small left pleural effusion likely reflects hemothorax. No pneumothorax. Consolidation left lung base likely atelectasis. Cardiac silhouette is unremarkable. IMPRESSION: 1. Displaced left lateral ninth and tenth rib fractures, with small left pleural effusion likely reflecting hemothorax. 2. No evidence of pneumothorax. Electronically Signed   By: Sharlet Salina M.D.   On: 09/12/2019 16:48   CT Chest W Contrast  Result Date: 09/12/2019 CLINICAL DATA:  Abdominal pain. Pain status post fall 2 weeks ago. Left-sided rib cage pain EXAM: CT CHEST, ABDOMEN, AND PELVIS WITH CONTRAST TECHNIQUE: Multidetector CT imaging of the chest, abdomen and pelvis was performed following the standard protocol during bolus administration of intravenous contrast. CONTRAST:  39mL OMNIPAQUE IOHEXOL 300 MG/ML  SOLN COMPARISON:  CT chest dated 04/13/2013. FINDINGS: CT CHEST FINDINGS Cardiovascular: The heart size is normal. The ascending thoracic aorta is ectatic measuring approximately 3.9 cm in diameter. There is no evidence for dissection. The arch vessels are grossly patent where visualized. The main pulmonary artery is within normal limits. There is no large centrally located pulmonary embolism. Coronary artery calcifications are noted. There are minimal calcifications of the thoracic aorta. Mediastinum/Nodes: --No mediastinal or hilar lymphadenopathy. --No axillary lymphadenopathy. --No supraclavicular lymphadenopathy.  --multiple thyroid nodules are noted. The largest thyroid nodule measures up to approximately 1 cm (axial series 2, image 13). No followup recommended (ref: J Am Coll Radiol. 2015 Feb;12(2): 143-50). --The esophagus is unremarkable Lungs/Pleura: There is a large left-sided pleural effusion. The pleural fluid measures approximately 28-30 Hounsfield units consistent with a probable left-sided hemothorax. There is partial collapse of the left lower lobe. There is no pneumothorax. Mild emphysematous changes are noted. There is scattered bilateral micro nodules that appears centrally stable since the patient's prior study in 2014 and as such no further follow-up is required. Musculoskeletal: There are multiple acute to subacute displaced and nondisplaced left-sided rib fractures. The rib fractures involve the anterior sixth rib on the patient's left in addition to the posterior eighth through twelfth ribs. There are segmental fractures involving the eighth through eleventh ribs. There is a chronic appearing fracture of the superior sternal body. CT ABDOMEN PELVIS FINDINGS Hepatobiliary: There is severe hepatic steatosis. Cholelithiasis without acute inflammation.There is no biliary ductal dilation. Pancreas: There are multiple calcifications throughout the patient's pancreas consistent with chronic pancreatitis. Spleen: No splenic laceration or hematoma. Adrenals/Urinary Tract: --Adrenal glands: No adrenal hemorrhage. --Right kidney/ureter: No hydronephrosis or perinephric hematoma. --Left kidney/ureter: No hydronephrosis or perinephric hematoma. --Urinary bladder: Unremarkable. Stomach/Bowel: --Stomach/Duodenum: No hiatal hernia or other gastric abnormality. Normal duodenal course and caliber. --Small bowel: No dilatation or inflammation. --Colon: No focal abnormality. --Appendix: Normal. Vascular/Lymphatic: Atherosclerotic calcification is present within the non-aneurysmal abdominal aorta, without hemodynamically  significant stenosis. --No retroperitoneal lymphadenopathy. --No mesenteric lymphadenopathy. --No pelvic or inguinal lymphadenopathy. Reproductive: Status post hysterectomy. No adnexal mass. Other: No ascites or free air. There is a fat containing umbilical hernia. Musculoskeletal. There are age-indeterminate compression fractures of the L3 and L4 vertebral bodies with approximately 20% height loss at the L3 level and 25% height loss at the  L4 level. Both of these fractures are favored to be acute to subacute. There are 5 non rib-bearing lumbar type vertebral bodies. There is grade 1 anterolisthesis of L4 on L5. IMPRESSION: 1. Large left-sided pleural effusion with partial collapse of the left lower lobe. There is increased density involving this left-sided pleural effusion suggesting that may in fact represent a left-sided hemothorax related to the patient's left-sided rib fractures. 2. Multiple acute to subacute displaced and nondisplaced left-sided rib fractures as detailed above. There are segmental fractures of the eighth through eleventh ribs on the left. 3. Age-indeterminate compression fractures of the L3 and L4 vertebral bodies. These are favored to be acute to subacute but should be correlated with point tenderness. 4. Hepatic steatosis. 5. Cholelithiasis without acute inflammation. 6. Chronic pancreatitis. Aortic Atherosclerosis (ICD10-I70.0). Electronically Signed   By: Constance Holster M.D.   On: 09/12/2019 20:23   CT Abdomen Pelvis W Contrast  Result Date: 09/12/2019 CLINICAL DATA:  Abdominal pain. Pain status post fall 2 weeks ago. Left-sided rib cage pain EXAM: CT CHEST, ABDOMEN, AND PELVIS WITH CONTRAST TECHNIQUE: Multidetector CT imaging of the chest, abdomen and pelvis was performed following the standard protocol during bolus administration of intravenous contrast. CONTRAST:  37mL OMNIPAQUE IOHEXOL 300 MG/ML  SOLN COMPARISON:  CT chest dated 04/13/2013. FINDINGS: CT CHEST FINDINGS  Cardiovascular: The heart size is normal. The ascending thoracic aorta is ectatic measuring approximately 3.9 cm in diameter. There is no evidence for dissection. The arch vessels are grossly patent where visualized. The main pulmonary artery is within normal limits. There is no large centrally located pulmonary embolism. Coronary artery calcifications are noted. There are minimal calcifications of the thoracic aorta. Mediastinum/Nodes: --No mediastinal or hilar lymphadenopathy. --No axillary lymphadenopathy. --No supraclavicular lymphadenopathy. --multiple thyroid nodules are noted. The largest thyroid nodule measures up to approximately 1 cm (axial series 2, image 13). No followup recommended (ref: J Am Coll Radiol. 2015 Feb;12(2): 143-50). --The esophagus is unremarkable Lungs/Pleura: There is a large left-sided pleural effusion. The pleural fluid measures approximately 28-30 Hounsfield units consistent with a probable left-sided hemothorax. There is partial collapse of the left lower lobe. There is no pneumothorax. Mild emphysematous changes are noted. There is scattered bilateral micro nodules that appears centrally stable since the patient's prior study in 2014 and as such no further follow-up is required. Musculoskeletal: There are multiple acute to subacute displaced and nondisplaced left-sided rib fractures. The rib fractures involve the anterior sixth rib on the patient's left in addition to the posterior eighth through twelfth ribs. There are segmental fractures involving the eighth through eleventh ribs. There is a chronic appearing fracture of the superior sternal body. CT ABDOMEN PELVIS FINDINGS Hepatobiliary: There is severe hepatic steatosis. Cholelithiasis without acute inflammation.There is no biliary ductal dilation. Pancreas: There are multiple calcifications throughout the patient's pancreas consistent with chronic pancreatitis. Spleen: No splenic laceration or hematoma. Adrenals/Urinary Tract:  --Adrenal glands: No adrenal hemorrhage. --Right kidney/ureter: No hydronephrosis or perinephric hematoma. --Left kidney/ureter: No hydronephrosis or perinephric hematoma. --Urinary bladder: Unremarkable. Stomach/Bowel: --Stomach/Duodenum: No hiatal hernia or other gastric abnormality. Normal duodenal course and caliber. --Small bowel: No dilatation or inflammation. --Colon: No focal abnormality. --Appendix: Normal. Vascular/Lymphatic: Atherosclerotic calcification is present within the non-aneurysmal abdominal aorta, without hemodynamically significant stenosis. --No retroperitoneal lymphadenopathy. --No mesenteric lymphadenopathy. --No pelvic or inguinal lymphadenopathy. Reproductive: Status post hysterectomy. No adnexal mass. Other: No ascites or free air. There is a fat containing umbilical hernia. Musculoskeletal. There are age-indeterminate compression fractures of the L3 and  L4 vertebral bodies with approximately 20% height loss at the L3 level and 25% height loss at the L4 level. Both of these fractures are favored to be acute to subacute. There are 5 non rib-bearing lumbar type vertebral bodies. There is grade 1 anterolisthesis of L4 on L5. IMPRESSION: 1. Large left-sided pleural effusion with partial collapse of the left lower lobe. There is increased density involving this left-sided pleural effusion suggesting that may in fact represent a left-sided hemothorax related to the patient's left-sided rib fractures. 2. Multiple acute to subacute displaced and nondisplaced left-sided rib fractures as detailed above. There are segmental fractures of the eighth through eleventh ribs on the left. 3. Age-indeterminate compression fractures of the L3 and L4 vertebral bodies. These are favored to be acute to subacute but should be correlated with point tenderness. 4. Hepatic steatosis. 5. Cholelithiasis without acute inflammation. 6. Chronic pancreatitis. Aortic Atherosclerosis (ICD10-I70.0). Electronically Signed    By: Katherine Mantle M.D.   On: 09/12/2019 20:23   DG Chest Portable 1 View  Result Date: 09/12/2019 CLINICAL DATA:  Status post chest tube placement EXAM: PORTABLE CHEST 1 VIEW COMPARISON:  Films from earlier in the same day. FINDINGS: Cardiac shadow is stable in appearance. Left-sided pleural effusion is again identified. Left chest tube is now noted in place. No pneumothorax is seen. Right lung remains clear. Cardiac shadow is stable. Aortic calcifications are noted. Previously seen left rib fractures are again noted. IMPRESSION: Interval chest tube placement. Persistent effusion is noted on the left. Left rib fractures are seen without pneumothorax. Electronically Signed   By: Alcide Clever M.D.   On: 09/12/2019 23:05      CHEST TUBE INSERTION  Date/Time: 09/12/2019 10:41 PM Performed by: Raeford Razor, MD Authorized by: Raeford Razor, MD   Consent:    Consent obtained:  Verbal and written   Consent given by:  Patient   Risks discussed:  Bleeding, incomplete drainage, pain, nerve damage, infection and damage to surrounding structures Pre-procedure details:    Skin preparation:  ChloraPrep   Preparation: Patient was prepped and draped in the usual sterile fashion   Anesthesia (see MAR for exact dosages):    Anesthesia method:  Local infiltration   Local anesthetic:  Lidocaine 1% w/o epi Procedure details:    Placement location:  L lateral   Scalpel size:  11   Tube size (French): 14.   Dissection instrument: dilator, catheter over wire technique.   Ultrasound guidance: no     Tension pneumothorax: no     Tube connected to:  Suction   Drainage characteristics:  Bloody   Dressing:  Petrolatum-impregnated gauze and 4x4 sterile gauze Post-procedure details:    Post-insertion x-ray findings: tube in good position     Patient tolerance of procedure:  Tolerated well, no immediate complications   63yF with multiple L side rib fxs and pleural effusion, likely hemothorax on imaging.  Discussed with trauma surgery. Recommended chest tube placement for diagnostic and also therapeutic purposes. Placed with less than 50 cc of dark red blood draining. I discussed with Dr Lovell Sheehan, general surgery, who is covering for [facility]. He doesn't mind managing the chest tube but his concern is that the initial injury was a couple weeks ago with a decent sized effusion but only getting a small volume of blood out. He has concern that she may end up needing CTS involvement and a higher level of care than we can provide here. I spoke with Dr Bedelia Person, trauma surgery. She  coordinated with Dr Tyrone Sage and he is planning on doing VATS in the morning. Would like her transferred to [facility] and requesting medical admission. I discussed with hospitalist, Dr Teodoro Kil. He feels that the primary reason for admission for is surgical and that it would be more appropriate for medicine for consult and CTS to admit primarily. Pt/dfaughter updated and understand plan to go to [facility]. Care signed out to Dr Manus Gunning at change of shift with accepting physician to be determined.   CRITICAL CARE Performed by: Raeford Razor Total critical care time: 35 minutes Critical care time was exclusive of separately billable procedures and treating other patients. Critical care was necessary to treat or prevent imminent or life-threatening deterioration. Critical care was time spent personally by me on the following activities: development of treatment plan with patient and/or surrogate as well as nursing, discussions with consultants, evaluation of patient's response to treatment, examination of patient, obtaining history from patient or surrogate, ordering and performing treatments and interventions, ordering and review of laboratory studies, ordering and review of radiographic studies, pulse oximetry and re-evaluation of patient's condition.     Raeford Razor, MD 09/13/19 [id number]

## 2019-09-12 NOTE — ED Notes (Signed)
Pt family brought food

## 2019-09-12 NOTE — ED Notes (Signed)
Pt ate dinner from [employer] before npo order was placed. No complaints at this time

## 2019-09-12 NOTE — ED Triage Notes (Signed)
Pt with black stools since Sunday, seen Dr. Felecia Shelling and sent here.   Pt states she fell two weeks ago. C/o left ribcage pain since.  Denies hitting her head, denies blood thinners.

## 2019-09-12 NOTE — ED Notes (Signed)
Pt attempted to urinate, but was not able to.

## 2019-09-12 NOTE — ED Provider Notes (Signed)
[facility] EMERGENCY DEPARTMENT Provider Note   CSN: [id number] Arrival date & time: 09/12/19  1446     History Chief Complaint  Patient presents with  . GI Bleeding    Gloria Santana is a 64 y.o. female with a history of COPD, hypertension, DM, tobacco abuse & EtOH abuse who presents to the ED with her daughter from PCP office for evaluation of dark stool x 3 days. Patient states she is having dark to black colored stool, 2 BMs per day, for the past 3 days. States for a few weeks now she has had some blood on the toilet paper that she attributed to hemorrhoids, no other blood noted. States she feels nauseous at times. She went to her PCP today for evaluation of her dark stool, was told there was blood in it, and was referred to the ED. She denies emesis, hematemesis, abdominal pain, dyspnea, lightheadedness, dizziness, or syncope. She states that she took an aspirin this AM but does not regularly take NSAIDs/aspirin. She does drink almost daily to daily, 2-4 beers per day, does have history of EtOH withdrawal, last drink was sometime this AM because she could not sleep. She also mentions some L sided chest wall pain s/p fall 2-3 weeks ago- fell between the commode and the bath tub when she was trying to sit down to use the bathroom as well as complaints of 2 weeks of productive cough of clear to white phelgm sputum. No head injury or LOC with fall. Denies fever, hemoptysis, or dyspnea.   HPI     Past Medical History:  Diagnosis Date  . Alcohol dependence (HCC)   . Anemia   . COPD (chronic obstructive pulmonary disease) (HCC)   . Diabetes mellitus without complication (HCC)    Pt states she is not diabetic  . Hypertension   . Poor circulation     Patient Active Problem List   Diagnosis Date Noted  . Fatty liver 07/13/2018  . Encounter for screening colonoscopy 07/13/2018    Past Surgical History:  Procedure Laterality Date  . ABDOMINAL HYSTERECTOMY       OB History   No  obstetric history on file.     Family History  Problem Relation Age of Onset  . Diabetes Mother   . Colon cancer Neg Hx   . Colon polyps Neg Hx     Social History   Tobacco Use  . Smoking status: Current Every Day Smoker    Packs/day: 2.00    Years: 35.00    Pack years: 70.00    Types: Cigarettes  . Smokeless tobacco: Never Used  Substance Use Topics  . Alcohol use: Yes    Alcohol/week: 14.0 standard drinks    Types: 14 Cans of beer per week  . Drug use: No    Home Medications Prior to Admission medications   Medication Sig Start Date End Date Taking? Authorizing Provider  albuterol (VENTOLIN HFA) 108 (90 Base) MCG/ACT inhaler Inhale 1-2 puffs into the lungs every 6 (six) hours as needed for wheezing or shortness of breath.    [provider]  fluorometholone (FML) 0.1 % ophthalmic ointment Place 1 application into the right eye 2 (two) times daily.    [provider]  folic acid (FOLVITE) 1 MG tablet Take 1 mg by mouth daily.    [provider]  Multiple Vitamin (MULTIVITAMIN WITH MINERALS) TABS tablet Take 1 tablet by mouth daily.    [provider]  ofloxacin (OCUFLOX) 0.3 %  ophthalmic solution Place 1 drop into the right eye 4 (four) times daily.    [provider]  spironolactone (ALDACTONE) 25 MG tablet Take 25 mg by mouth 2 (two) times daily.    [provider]    Allergies    Patient has no known allergies.  Review of Systems   Review of Systems  Constitutional: Negative for chills and fever.  Respiratory: Positive for cough. Negative for shortness of breath.   Cardiovascular: Positive for chest pain (L chest wall s/p fall).  Gastrointestinal: Positive for blood in stool and nausea. Negative for abdominal pain and vomiting.  Genitourinary: Negative for dysuria.  Skin: Negative for rash.  Neurological: Negative for dizziness, syncope and light-headedness.  All other systems reviewed and are  negative.   Physical Exam Updated Vital Signs BP (!) 130/103   Pulse 96   Temp 98.3 F (36.8 C) (Oral)   Resp 20   Ht 5\' 7"  (1.702 m)   Wt 49 kg   SpO2 100%   BMI 16.92 kg/m   Physical Exam Vitals and nursing note reviewed. Exam conducted with a chaperone present.  Constitutional:      General: She is not in acute distress.    Appearance: She is well-developed.  HENT:     Head: Normocephalic and atraumatic. No raccoon eyes or Battle's sign.     Right Ear: No hemotympanum.     Left Ear: No hemotympanum.  Eyes:     General:        Right eye: No discharge.        Left eye: No discharge.     Conjunctiva/sclera: Conjunctivae normal.     Pupils: Pupils are equal, round, and reactive to light.  Cardiovascular:     Rate and Rhythm: Regular rhythm. Tachycardia present.     Pulses:          Radial pulses are 2+ on the right side and 2+ on the left side.     Heart sounds: No murmur.  Pulmonary:     Effort: No respiratory distress.     Breath sounds: Decreased breath sounds (L lower lung field) present. No wheezing, rhonchi or rales.  Chest:     Chest wall: Tenderness (left lateral chest wall without overlying ecchymosis/abrasions) present.  Abdominal:     General: There is no distension.     Palpations: Abdomen is soft.     Tenderness: There is abdominal tenderness (upper abdomen, more so in the LUQ). There is no guarding or rebound.  Genitourinary:    Rectum: External hemorrhoid present.     Comments: DRE: dark brown stool, no melena or hematochezia.  Musculoskeletal:     Cervical back: Neck supple. No spinous process tenderness.     Comments: No midline spinal tenderness.  ROM intact throughout extremities x 4 without focal bony tenderness.   Skin:    General: Skin is warm and dry.     Findings: No rash.  Psychiatric:        Behavior: Behavior normal.     ED Results / Procedures / Treatments   Labs (all labs ordered are listed, but only abnormal results are  displayed) Labs Reviewed  COMPREHENSIVE METABOLIC PANEL - Abnormal; Notable for the following components:      Result Value   Sodium 126 (*)    Chloride 88 (*)    BUN 7 (*)    AST 65 (*)    Alkaline Phosphatase 132 (*)    Total Bilirubin 1.9 (*)  All other components within normal limits  CBC WITH DIFFERENTIAL/PLATELET - Abnormal; Notable for the following components:   RBC 3.77 (*)    Hemoglobin 11.4 (*)    HCT 34.1 (*)    Platelets 116 (*)    All other components within normal limits  URINALYSIS, ROUTINE W REFLEX MICROSCOPIC - Abnormal; Notable for the following components:   APPearance CLOUDY (*)    All other components within normal limits  LIPASE, BLOOD  POC OCCULT BLOOD, ED    EKG None  Radiology DG Ribs Unilateral W/Chest Left  Result Date: 09/12/2019 CLINICAL DATA:  Left axillary rib pain, fell 2 weeks ago EXAM: LEFT RIBS AND CHEST - 3+ VIEW COMPARISON:  01/05/2019 FINDINGS: Frontal and oblique views of the left thoracic cage are obtained. There are minimally displaced left lateral ninth and tenth rib fractures. Small left pleural effusion likely reflects hemothorax. No pneumothorax. Consolidation left lung base likely atelectasis. Cardiac silhouette is unremarkable. IMPRESSION: 1. Displaced left lateral ninth and tenth rib fractures, with small left pleural effusion likely reflecting hemothorax. 2. No evidence of pneumothorax. Electronically Signed   By: Sharlet Salina M.D.   On: 09/12/2019 16:48   CT Chest W Contrast  Result Date: 09/12/2019 CLINICAL DATA:  Abdominal pain. Pain status post fall 2 weeks ago. Left-sided rib cage pain EXAM: CT CHEST, ABDOMEN, AND PELVIS WITH CONTRAST TECHNIQUE: Multidetector CT imaging of the chest, abdomen and pelvis was performed following the standard protocol during bolus administration of intravenous contrast. CONTRAST:  4mL OMNIPAQUE IOHEXOL 300 MG/ML  SOLN COMPARISON:  CT chest dated 04/13/2013. FINDINGS: CT CHEST FINDINGS  Cardiovascular: The heart size is normal. The ascending thoracic aorta is ectatic measuring approximately 3.9 cm in diameter. There is no evidence for dissection. The arch vessels are grossly patent where visualized. The main pulmonary artery is within normal limits. There is no large centrally located pulmonary embolism. Coronary artery calcifications are noted. There are minimal calcifications of the thoracic aorta. Mediastinum/Nodes: --No mediastinal or hilar lymphadenopathy. --No axillary lymphadenopathy. --No supraclavicular lymphadenopathy. --multiple thyroid nodules are noted. The largest thyroid nodule measures up to approximately 1 cm (axial series 2, image 13). No followup recommended (ref: J Am Coll Radiol. 2015 Feb;12(2): 143-50). --The esophagus is unremarkable Lungs/Pleura: There is a large left-sided pleural effusion. The pleural fluid measures approximately 28-30 Hounsfield units consistent with a probable left-sided hemothorax. There is partial collapse of the left lower lobe. There is no pneumothorax. Mild emphysematous changes are noted. There is scattered bilateral micro nodules that appears centrally stable since the patient's prior study in 2014 and as such no further follow-up is required. Musculoskeletal: There are multiple acute to subacute displaced and nondisplaced left-sided rib fractures. The rib fractures involve the anterior sixth rib on the patient's left in addition to the posterior eighth through twelfth ribs. There are segmental fractures involving the eighth through eleventh ribs. There is a chronic appearing fracture of the superior sternal body. CT ABDOMEN PELVIS FINDINGS Hepatobiliary: There is severe hepatic steatosis. Cholelithiasis without acute inflammation.There is no biliary ductal dilation. Pancreas: There are multiple calcifications throughout the patient's pancreas consistent with chronic pancreatitis. Spleen: No splenic laceration or hematoma. Adrenals/Urinary Tract:  --Adrenal glands: No adrenal hemorrhage. --Right kidney/ureter: No hydronephrosis or perinephric hematoma. --Left kidney/ureter: No hydronephrosis or perinephric hematoma. --Urinary bladder: Unremarkable. Stomach/Bowel: --Stomach/Duodenum: No hiatal hernia or other gastric abnormality. Normal duodenal course and caliber. --Small bowel: No dilatation or inflammation. --Colon: No focal abnormality. --Appendix: Normal. Vascular/Lymphatic: Atherosclerotic calcification is present within  the non-aneurysmal abdominal aorta, without hemodynamically significant stenosis. --No retroperitoneal lymphadenopathy. --No mesenteric lymphadenopathy. --No pelvic or inguinal lymphadenopathy. Reproductive: Status post hysterectomy. No adnexal mass. Other: No ascites or free air. There is a fat containing umbilical hernia. Musculoskeletal. There are age-indeterminate compression fractures of the L3 and L4 vertebral bodies with approximately 20% height loss at the L3 level and 25% height loss at the L4 level. Both of these fractures are favored to be acute to subacute. There are 5 non rib-bearing lumbar type vertebral bodies. There is grade 1 anterolisthesis of L4 on L5. IMPRESSION: 1. Large left-sided pleural effusion with partial collapse of the left lower lobe. There is increased density involving this left-sided pleural effusion suggesting that may in fact represent a left-sided hemothorax related to the patient's left-sided rib fractures. 2. Multiple acute to subacute displaced and nondisplaced left-sided rib fractures as detailed above. There are segmental fractures of the eighth through eleventh ribs on the left. 3. Age-indeterminate compression fractures of the L3 and L4 vertebral bodies. These are favored to be acute to subacute but should be correlated with point tenderness. 4. Hepatic steatosis. 5. Cholelithiasis without acute inflammation. 6. Chronic pancreatitis. Aortic Atherosclerosis (ICD10-I70.0). Electronically Signed    By: Katherine Mantle M.D.   On: 09/12/2019 20:23   CT Abdomen Pelvis W Contrast  Result Date: 09/12/2019 CLINICAL DATA:  Abdominal pain. Pain status post fall 2 weeks ago. Left-sided rib cage pain EXAM: CT CHEST, ABDOMEN, AND PELVIS WITH CONTRAST TECHNIQUE: Multidetector CT imaging of the chest, abdomen and pelvis was performed following the standard protocol during bolus administration of intravenous contrast. CONTRAST:  19mL OMNIPAQUE IOHEXOL 300 MG/ML  SOLN COMPARISON:  CT chest dated 04/13/2013. FINDINGS: CT CHEST FINDINGS Cardiovascular: The heart size is normal. The ascending thoracic aorta is ectatic measuring approximately 3.9 cm in diameter. There is no evidence for dissection. The arch vessels are grossly patent where visualized. The main pulmonary artery is within normal limits. There is no large centrally located pulmonary embolism. Coronary artery calcifications are noted. There are minimal calcifications of the thoracic aorta. Mediastinum/Nodes: --No mediastinal or hilar lymphadenopathy. --No axillary lymphadenopathy. --No supraclavicular lymphadenopathy. --multiple thyroid nodules are noted. The largest thyroid nodule measures up to approximately 1 cm (axial series 2, image 13). No followup recommended (ref: J Am Coll Radiol. 2015 Feb;12(2): 143-50). --The esophagus is unremarkable Lungs/Pleura: There is a large left-sided pleural effusion. The pleural fluid measures approximately 28-30 Hounsfield units consistent with a probable left-sided hemothorax. There is partial collapse of the left lower lobe. There is no pneumothorax. Mild emphysematous changes are noted. There is scattered bilateral micro nodules that appears centrally stable since the patient's prior study in 2014 and as such no further follow-up is required. Musculoskeletal: There are multiple acute to subacute displaced and nondisplaced left-sided rib fractures. The rib fractures involve the anterior sixth rib on the patient's left  in addition to the posterior eighth through twelfth ribs. There are segmental fractures involving the eighth through eleventh ribs. There is a chronic appearing fracture of the superior sternal body. CT ABDOMEN PELVIS FINDINGS Hepatobiliary: There is severe hepatic steatosis. Cholelithiasis without acute inflammation.There is no biliary ductal dilation. Pancreas: There are multiple calcifications throughout the patient's pancreas consistent with chronic pancreatitis. Spleen: No splenic laceration or hematoma. Adrenals/Urinary Tract: --Adrenal glands: No adrenal hemorrhage. --Right kidney/ureter: No hydronephrosis or perinephric hematoma. --Left kidney/ureter: No hydronephrosis or perinephric hematoma. --Urinary bladder: Unremarkable. Stomach/Bowel: --Stomach/Duodenum: No hiatal hernia or other gastric abnormality. Normal duodenal course and caliber. --  Small bowel: No dilatation or inflammation. --Colon: No focal abnormality. --Appendix: Normal. Vascular/Lymphatic: Atherosclerotic calcification is present within the non-aneurysmal abdominal aorta, without hemodynamically significant stenosis. --No retroperitoneal lymphadenopathy. --No mesenteric lymphadenopathy. --No pelvic or inguinal lymphadenopathy. Reproductive: Status post hysterectomy. No adnexal mass. Other: No ascites or free air. There is a fat containing umbilical hernia. Musculoskeletal. There are age-indeterminate compression fractures of the L3 and L4 vertebral bodies with approximately 20% height loss at the L3 level and 25% height loss at the L4 level. Both of these fractures are favored to be acute to subacute. There are 5 non rib-bearing lumbar type vertebral bodies. There is grade 1 anterolisthesis of L4 on L5. IMPRESSION: 1. Large left-sided pleural effusion with partial collapse of the left lower lobe. There is increased density involving this left-sided pleural effusion suggesting that may in fact represent a left-sided hemothorax related to the  patient's left-sided rib fractures. 2. Multiple acute to subacute displaced and nondisplaced left-sided rib fractures as detailed above. There are segmental fractures of the eighth through eleventh ribs on the left. 3. Age-indeterminate compression fractures of the L3 and L4 vertebral bodies. These are favored to be acute to subacute but should be correlated with point tenderness. 4. Hepatic steatosis. 5. Cholelithiasis without acute inflammation. 6. Chronic pancreatitis. Aortic Atherosclerosis (ICD10-I70.0). Electronically Signed   By: Katherine Mantlehristopher  Green M.D.   On: 09/12/2019 20:23    Procedures Procedures (including critical care time)  Medications Ordered in ED Medications  sodium chloride 0.9 % bolus 500 mL (500 mLs Intravenous New Bag/Given 09/12/19 1551)  pantoprazole (PROTONIX) injection 40 mg (40 mg Intravenous Given 09/12/19 1552)    ED Course  I have reviewed the triage vital signs and the nursing notes.  Pertinent labs & imaging results that were available during my care of the patient were reviewed by me and considered in my medical decision making (see chart for details).    MDM Rules/Calculators/A&P                      Patient presents to the ED with complaints of dark colored stool for the past few days as well as some L sided rib pain s/p fall 2-3 weeks prior. Patient is nontoxic, resting comfortably, initial vitals with mild tachycardia otherwise fairly unremarkable. In regards to fall seems to have well localized tenderness to the L lateral ribs as well as to the LUQ of the abdomen, does also have some diffuse more mild epigastric/RUQ abdominal tenderness, no overlying skin changes. She has no signs of head injury, alert & oriented, no midline spinal tenderness. In terms of concern for GI bleed- DRE with soft dark brown stool, no melena/hematochezia, non thromboses non bleeding external hemorrhoids present, fecal occult negative. Will start with labs & xray of the L ribs/chest,  ultimately will likely obtain Ct A/P, possibly chest as well based on xray findings.   CBC: Anemia that is similar to prior. No leukocytosis.  CMP: Hyponatremia & hypochloremia somewhat similar to prior lab ranges, but is worse than most recent labs on record. ALT elevated but improved, T bili elevated but fairly similar to prior ranges. BUN is not elevated.  Lipase: WNL UA: No UTI.   Chest/rib xray:  1. Displaced left lateral ninth and tenth rib fractures, with small left pleural effusion likely reflecting hemothorax. 2. No evidence of pneumothorax.  Will proceed w/ CT chest/abdomen/pelvis with contrast for further assessment.  CT imaging notable for:  1. Large left-sided pleural effusion with  partial collapse of the left lower lobe. There is increased density involving this left-sided pleural effusion suggesting that may in fact represent a left-sided hemothorax related to the patient's left-sided rib fractures.  2. Multiple acute to subacute displaced and nondisplaced left-sided rib fractures as detailed above. ---> The rib fractures involve the anterior sixth rib on the patient's left in addition to the posterior eighth through twelfth ribs. There are segmental fractures involving the eighth through eleventh ribs. There is a chronic appearing fracture of the superior sternal body.  3. Age-indeterminate compression fractures of the L3 and L4 vertebral bodies. These are favored to be acute to subacute but should be correlated with point tenderness.  4. Hepatic steatosis.  5. Cholelithiasis without acute inflammation.  6. Chronic pancreatitis. Aortic Atherosclerosis.   Will discuss with trauma surgery on call regarding CT chest findings.   21:16: CONSULT: Discussed with trauma surgeron Dr. Bedelia Person- recommends chest tube placement for both therapeutic & diagnostic purposes- states very likely to be blood, admit for chest tube management- pending availability of chest tube to be placed & managed @  this facility patient OK to stay at [facility].    Chest tube placed by supervising physician Dr. Juleen China who has assumed care of patient @ change of shift pending discussion with general surgery regarding chest tube management at this facility & admission.   Regarding concern for dark stool- no melena/hematochezia on exam, fecal occult negative, hgb/hct stable compared to prior, no BUN elevation - do not suspect significant acute upper GI bleed at this time.   Final Clinical Impression(s) / ED Diagnoses Final diagnoses:  Closed fracture of multiple ribs of left side, initial encounter  Hemothorax    Rx / DC Orders ED Discharge Orders    None       Desmond Lope 09/12/19 2254    Raeford Razor, MD 09/13/19 Silva Bandy

## 2019-09-13 ENCOUNTER — Inpatient Hospital Stay (HOSPITAL_COMMUNITY): Payer: Medicaid Other

## 2019-09-13 ENCOUNTER — Encounter (HOSPITAL_COMMUNITY)
Admission: EM | Disposition: A | Discharge status: Home / Self Care | Payer: Self-pay | Source: Home / Self Care | Attending: Family Medicine

## 2019-09-13 ENCOUNTER — Encounter (HOSPITAL_COMMUNITY): Payer: Self-pay | Admitting: Internal Medicine

## 2019-09-13 DIAGNOSIS — F1020 Alcohol dependence, uncomplicated: Secondary | ICD-10-CM | POA: Diagnosis not present

## 2019-09-13 DIAGNOSIS — R000 Tachycardia, unspecified: Secondary | ICD-10-CM | POA: Diagnosis not present

## 2019-09-13 DIAGNOSIS — J942 Hemothorax: Secondary | ICD-10-CM | POA: Diagnosis present

## 2019-09-13 DIAGNOSIS — J90 Pleural effusion, not elsewhere classified: Secondary | ICD-10-CM | POA: Diagnosis present

## 2019-09-13 DIAGNOSIS — S271XXA Traumatic hemothorax, initial encounter: Secondary | ICD-10-CM | POA: Diagnosis present

## 2019-09-13 DIAGNOSIS — K8020 Calculus of gallbladder without cholecystitis without obstruction: Secondary | ICD-10-CM | POA: Diagnosis not present

## 2019-09-13 DIAGNOSIS — Z20822 Contact with and (suspected) exposure to covid-19: Secondary | ICD-10-CM | POA: Diagnosis present

## 2019-09-13 DIAGNOSIS — Y92012 Bathroom of single-family (private) house as the place of occurrence of the external cause: Secondary | ICD-10-CM | POA: Diagnosis not present

## 2019-09-13 DIAGNOSIS — I10 Essential (primary) hypertension: Secondary | ICD-10-CM | POA: Diagnosis present

## 2019-09-13 DIAGNOSIS — K760 Fatty (change of) liver, not elsewhere classified: Secondary | ICD-10-CM | POA: Diagnosis not present

## 2019-09-13 DIAGNOSIS — D696 Thrombocytopenia, unspecified: Secondary | ICD-10-CM | POA: Diagnosis present

## 2019-09-13 DIAGNOSIS — W19XXXA Unspecified fall, initial encounter: Secondary | ICD-10-CM | POA: Diagnosis not present

## 2019-09-13 DIAGNOSIS — Z79899 Other long term (current) drug therapy: Secondary | ICD-10-CM | POA: Diagnosis not present

## 2019-09-13 DIAGNOSIS — Z681 Body mass index (BMI) 19 or less, adult: Secondary | ICD-10-CM | POA: Diagnosis not present

## 2019-09-13 DIAGNOSIS — F17210 Nicotine dependence, cigarettes, uncomplicated: Secondary | ICD-10-CM | POA: Diagnosis not present

## 2019-09-13 DIAGNOSIS — J449 Chronic obstructive pulmonary disease, unspecified: Secondary | ICD-10-CM

## 2019-09-13 DIAGNOSIS — Z833 Family history of diabetes mellitus: Secondary | ICD-10-CM | POA: Diagnosis not present

## 2019-09-13 DIAGNOSIS — E871 Hypo-osmolality and hyponatremia: Secondary | ICD-10-CM | POA: Diagnosis present

## 2019-09-13 DIAGNOSIS — E43 Unspecified severe protein-calorie malnutrition: Secondary | ICD-10-CM | POA: Diagnosis present

## 2019-09-13 DIAGNOSIS — E119 Type 2 diabetes mellitus without complications: Secondary | ICD-10-CM | POA: Diagnosis not present

## 2019-09-13 DIAGNOSIS — S2242XA Multiple fractures of ribs, left side, initial encounter for closed fracture: Secondary | ICD-10-CM | POA: Diagnosis present

## 2019-09-13 DIAGNOSIS — D649 Anemia, unspecified: Secondary | ICD-10-CM | POA: Diagnosis present

## 2019-09-13 DIAGNOSIS — F329 Major depressive disorder, single episode, unspecified: Secondary | ICD-10-CM | POA: Diagnosis present

## 2019-09-13 DIAGNOSIS — S2239XA Fracture of one rib, unspecified side, initial encounter for closed fracture: Secondary | ICD-10-CM

## 2019-09-13 DIAGNOSIS — Z7982 Long term (current) use of aspirin: Secondary | ICD-10-CM | POA: Diagnosis not present

## 2019-09-13 DIAGNOSIS — K649 Unspecified hemorrhoids: Secondary | ICD-10-CM | POA: Diagnosis not present

## 2019-09-13 DIAGNOSIS — K861 Other chronic pancreatitis: Secondary | ICD-10-CM | POA: Diagnosis present

## 2019-09-13 HISTORY — PX: VIDEO BRONCHOSCOPY: SHX5072

## 2019-09-13 HISTORY — PX: VIDEO ASSISTED THORACOSCOPY (VATS)/EMPYEMA: SHX6172

## 2019-09-13 LAB — BLOOD GAS, ARTERIAL
Acid-Base Excess: 1.9 mmol/L (ref 0.0–2.0)
Bicarbonate: 25.4 mmol/L (ref 20.0–28.0)
Drawn by: 44898
FIO2: 21
O2 Saturation: 97 %
Patient temperature: 37
pCO2 arterial: 36.2 mmHg (ref 32.0–48.0)
pH, Arterial: 7.461 — ABNORMAL HIGH (ref 7.350–7.450)
pO2, Arterial: 89.5 mmHg (ref 83.0–108.0)

## 2019-09-13 LAB — HEMOGLOBIN A1C
Hgb A1c MFr Bld: 4.5 % — ABNORMAL LOW (ref 4.8–5.6)
Mean Plasma Glucose: 82.45 mg/dL

## 2019-09-13 LAB — POCT I-STAT 7, (LYTES, BLD GAS, ICA,H+H)
Bicarbonate: 25.1 mmol/L (ref 20.0–28.0)
Calcium, Ion: 1.25 mmol/L (ref 1.15–1.40)
HCT: 30 % — ABNORMAL LOW (ref 36.0–46.0)
Hemoglobin: 10.2 g/dL — ABNORMAL LOW (ref 12.0–15.0)
O2 Saturation: 100 %
Patient temperature: 36
Potassium: 2.8 mmol/L — ABNORMAL LOW (ref 3.5–5.1)
Sodium: 131 mmol/L — ABNORMAL LOW (ref 135–145)
TCO2: 26 mmol/L (ref 22–32)
pCO2 arterial: 39.9 mmHg (ref 32.0–48.0)
pH, Arterial: 7.402 (ref 7.350–7.450)
pO2, Arterial: 226 mmHg — ABNORMAL HIGH (ref 83.0–108.0)

## 2019-09-13 LAB — BASIC METABOLIC PANEL
Anion gap: 13 (ref 5–15)
BUN: <5 mg/dL — ABNORMAL LOW (ref 8–23)
CO2: 21 mmol/L — ABNORMAL LOW (ref 22–32)
Calcium: 8.7 mg/dL — ABNORMAL LOW (ref 8.9–10.3)
Chloride: 98 mmol/L (ref 98–111)
Creatinine, Ser: 0.55 mg/dL (ref 0.44–1.00)
GFR calc Af Amer: >60 mL/min (ref >60)
GFR calc non Af Amer: >60 mL/min (ref >60)
Glucose, Bld: 113 mg/dL — ABNORMAL HIGH (ref 70–99)
Potassium: 3.4 mmol/L — ABNORMAL LOW (ref 3.5–5.1)
Sodium: 132 mmol/L — ABNORMAL LOW (ref 135–145)

## 2019-09-13 LAB — GLUCOSE, CAPILLARY
Glucose-Capillary: 102 mg/dL — ABNORMAL HIGH (ref 70–99)
Glucose-Capillary: 142 mg/dL — ABNORMAL HIGH (ref 70–99)
Glucose-Capillary: 145 mg/dL — ABNORMAL HIGH (ref 70–99)
Glucose-Capillary: 79 mg/dL (ref 70–99)
Glucose-Capillary: 96 mg/dL (ref 70–99)

## 2019-09-13 LAB — URINALYSIS, ROUTINE W REFLEX MICROSCOPIC
Bilirubin Urine: NEGATIVE
Glucose, UA: NEGATIVE mg/dL
Ketones, ur: 5 mg/dL — AB
Leukocytes,Ua: NEGATIVE
Nitrite: NEGATIVE
Protein, ur: NEGATIVE mg/dL
Specific Gravity, Urine: 1.011 (ref 1.005–1.030)
pH: 6 (ref 5.0–8.0)

## 2019-09-13 LAB — MRSA PCR SCREENING: MRSA by PCR: NEGATIVE

## 2019-09-13 LAB — HEPATIC FUNCTION PANEL
ALT: 17 U/L (ref 0–44)
AST: 51 U/L — ABNORMAL HIGH (ref 15–41)
Albumin: 3.2 g/dL — ABNORMAL LOW (ref 3.5–5.0)
Alkaline Phosphatase: 90 U/L (ref 38–126)
Bilirubin, Direct: 0.6 mg/dL — ABNORMAL HIGH (ref 0.0–0.2)
Indirect Bilirubin: 1 mg/dL — ABNORMAL HIGH (ref 0.3–0.9)
Total Bilirubin: 1.6 mg/dL — ABNORMAL HIGH (ref 0.3–1.2)
Total Protein: 5.9 g/dL — ABNORMAL LOW (ref 6.5–8.1)

## 2019-09-13 LAB — CBC
HCT: 34 % — ABNORMAL LOW (ref 36.0–46.0)
Hemoglobin: 11.5 g/dL — ABNORMAL LOW (ref 12.0–15.0)
MCH: 30.1 pg (ref 26.0–34.0)
MCHC: 33.8 g/dL (ref 30.0–36.0)
MCV: 89 fL (ref 80.0–100.0)
Platelets: 121 10*3/uL — ABNORMAL LOW (ref 150–400)
RBC: 3.82 MIL/uL — ABNORMAL LOW (ref 3.87–5.11)
RDW: 13.7 % (ref 11.5–15.5)
WBC: 5.2 10*3/uL (ref 4.0–10.5)
nRBC: 0 % (ref 0.0–0.2)

## 2019-09-13 LAB — RESPIRATORY PANEL BY RT PCR (FLU A&B, COVID)
Influenza A by PCR: NEGATIVE
Influenza B by PCR: NEGATIVE
SARS Coronavirus 2 by RT PCR: NEGATIVE

## 2019-09-13 LAB — APTT: aPTT: 33 seconds (ref 24–36)

## 2019-09-13 LAB — ABO/RH: ABO/RH(D): O POS

## 2019-09-13 LAB — PREPARE RBC (CROSSMATCH)

## 2019-09-13 SURGERY — VIDEO ASSISTED THORACOSCOPY (VATS)/EMPYEMA
Anesthesia: General | Site: Chest

## 2019-09-13 MED ORDER — SODIUM CHLORIDE 0.9 % IV SOLN: 250.0000 mL | INTRAVENOUS | Status: DC | PRN

## 2019-09-13 MED ORDER — TRAMADOL HCL 50 MG PO TABS: 50.0000 mg | ORAL_TABLET | Freq: Four times a day (QID) | ORAL | Status: DC | PRN

## 2019-09-13 MED ORDER — ACETAMINOPHEN 650 MG RE SUPP: 650.0000 mg | Freq: Four times a day (QID) | RECTAL | Status: DC | PRN

## 2019-09-13 MED ORDER — CEFAZOLIN SODIUM-DEXTROSE 2-4 GM/100ML-% IV SOLN
2.0000 g | Freq: Three times a day (TID) | INTRAVENOUS | Status: AC
  Administered 2019-09-13 – 2019-09-14 (×2): 2 g via INTRAVENOUS
  Filled 2019-09-13 (×2): qty 100

## 2019-09-13 MED ORDER — LORAZEPAM 2 MG/ML IJ SOLN: 1.0000 mg | INTRAMUSCULAR | Status: DC | PRN

## 2019-09-13 MED ORDER — PHENYLEPHRINE 40 MCG/ML (10ML) SYRINGE FOR IV PUSH (FOR BLOOD PRESSURE SUPPORT)
PREFILLED_SYRINGE | INTRAVENOUS | Status: DC | PRN
  Administered 2019-09-13 (×6): 80 ug via INTRAVENOUS

## 2019-09-13 MED ORDER — SUGAMMADEX SODIUM 200 MG/2ML IV SOLN
INTRAVENOUS | Status: DC | PRN
  Administered 2019-09-13: 200 mg via INTRAVENOUS

## 2019-09-13 MED ORDER — MUPIROCIN 2 % EX OINT
TOPICAL_OINTMENT | CUTANEOUS | Status: AC
  Filled 2019-09-13: qty 22

## 2019-09-13 MED ORDER — FOLIC ACID 1 MG PO TABS
1.0000 mg | ORAL_TABLET | Freq: Every day | ORAL | Status: DC
  Administered 2019-09-14 – 2019-09-16 (×3): 1 mg via ORAL
  Filled 2019-09-13 (×3): qty 1

## 2019-09-13 MED ORDER — LORAZEPAM 1 MG PO TABS: 1.0000 mg | ORAL_TABLET | ORAL | Status: DC | PRN

## 2019-09-13 MED ORDER — INSULIN ASPART 100 UNIT/ML ~~LOC~~ SOLN
0.0000 [IU] | Freq: Four times a day (QID) | SUBCUTANEOUS | Status: DC
  Administered 2019-09-13 (×2): 2 [IU] via SUBCUTANEOUS

## 2019-09-13 MED ORDER — SODIUM CHLORIDE FLUSH 0.9 % IV SOLN
INTRAVENOUS | Status: DC | PRN
  Administered 2019-09-13: 100 mL

## 2019-09-13 MED ORDER — LIDOCAINE 2% (20 MG/ML) 5 ML SYRINGE
INTRAMUSCULAR | Status: AC
  Filled 2019-09-13: qty 5

## 2019-09-13 MED ORDER — PROPOFOL 10 MG/ML IV BOLUS
INTRAVENOUS | Status: DC | PRN
  Administered 2019-09-13: 40 mg via INTRAVENOUS
  Administered 2019-09-13: 80 mg via INTRAVENOUS

## 2019-09-13 MED ORDER — ONDANSETRON HCL 4 MG/2ML IJ SOLN: 4.0000 mg | Freq: Four times a day (QID) | INTRAMUSCULAR | Status: DC | PRN

## 2019-09-13 MED ORDER — SODIUM CHLORIDE 0.9% FLUSH
3.0000 mL | Freq: Two times a day (BID) | INTRAVENOUS | Status: DC
  Administered 2019-09-13: 3 mL via INTRAVENOUS

## 2019-09-13 MED ORDER — FENTANYL CITRATE (PF) 250 MCG/5ML IJ SOLN
INTRAMUSCULAR | Status: DC | PRN
  Administered 2019-09-13: 100 ug via INTRAVENOUS

## 2019-09-13 MED ORDER — BUPIVACAINE HCL (PF) 0.5 % IJ SOLN
INTRAMUSCULAR | Status: AC
  Filled 2019-09-13: qty 30

## 2019-09-13 MED ORDER — SENNOSIDES-DOCUSATE SODIUM 8.6-50 MG PO TABS
1.0000 | ORAL_TABLET | Freq: Every day | ORAL | Status: DC
  Administered 2019-09-14 – 2019-09-15 (×2): 1 via ORAL
  Filled 2019-09-13 (×3): qty 1

## 2019-09-13 MED ORDER — 0.9 % SODIUM CHLORIDE (POUR BTL) OPTIME
TOPICAL | Status: DC | PRN
  Administered 2019-09-13: 2000 mL

## 2019-09-13 MED ORDER — BISACODYL 5 MG PO TBEC
10.0000 mg | DELAYED_RELEASE_TABLET | Freq: Every day | ORAL | Status: DC
  Administered 2019-09-13 – 2019-09-16 (×3): 10 mg via ORAL
  Filled 2019-09-13 (×4): qty 2

## 2019-09-13 MED ORDER — SODIUM CHLORIDE 0.9 % IV SOLN: INTRAVENOUS | Status: DC | PRN

## 2019-09-13 MED ORDER — PANTOPRAZOLE SODIUM 40 MG IV SOLR
40.0000 mg | Freq: Every day | INTRAVENOUS | Status: DC
  Administered 2019-09-13: 40 mg via INTRAVENOUS
  Filled 2019-09-13 (×2): qty 40

## 2019-09-13 MED ORDER — SODIUM CHLORIDE 0.9% FLUSH: 3.0000 mL | INTRAVENOUS | Status: DC | PRN

## 2019-09-13 MED ORDER — PROPOFOL 10 MG/ML IV BOLUS
INTRAVENOUS | Status: AC
  Filled 2019-09-13: qty 40

## 2019-09-13 MED ORDER — FENTANYL CITRATE (PF) 250 MCG/5ML IJ SOLN
INTRAMUSCULAR | Status: AC
  Filled 2019-09-13: qty 5

## 2019-09-13 MED ORDER — POLYETHYLENE GLYCOL 3350 17 G PO PACK: 17.0000 g | PACK | Freq: Every day | ORAL | Status: DC | PRN

## 2019-09-13 MED ORDER — ALBUMIN HUMAN 5 % IV SOLN: INTRAVENOUS | Status: DC | PRN

## 2019-09-13 MED ORDER — OXYCODONE HCL 5 MG PO TABS: 5.0000 mg | ORAL_TABLET | Freq: Once | ORAL | Status: DC | PRN

## 2019-09-13 MED ORDER — ACETAMINOPHEN 500 MG PO TABS
1000.0000 mg | ORAL_TABLET | Freq: Four times a day (QID) | ORAL | Status: DC
  Administered 2019-09-13 – 2019-09-16 (×9): 1000 mg via ORAL
  Filled 2019-09-13 (×12): qty 2

## 2019-09-13 MED ORDER — OXYCODONE HCL 5 MG/5ML PO SOLN: 5.0000 mg | Freq: Once | ORAL | Status: DC | PRN

## 2019-09-13 MED ORDER — ALBUTEROL SULFATE (2.5 MG/3ML) 0.083% IN NEBU: 3.0000 mL | INHALATION_SOLUTION | RESPIRATORY_TRACT | Status: DC | PRN

## 2019-09-13 MED ORDER — LACTATED RINGERS IV SOLN: INTRAVENOUS | Status: DC

## 2019-09-13 MED ORDER — MIDAZOLAM HCL 2 MG/2ML IJ SOLN
INTRAMUSCULAR | Status: AC
  Filled 2019-09-13: qty 2

## 2019-09-13 MED ORDER — FENTANYL CITRATE (PF) 100 MCG/2ML IJ SOLN: 25.0000 ug | INTRAMUSCULAR | Status: DC | PRN

## 2019-09-13 MED ORDER — CEFAZOLIN SODIUM-DEXTROSE 2-4 GM/100ML-% IV SOLN
2.0000 g | INTRAVENOUS | Status: AC
  Administered 2019-09-13: 2 g via INTRAVENOUS
  Filled 2019-09-13: qty 100

## 2019-09-13 MED ORDER — DEXAMETHASONE SODIUM PHOSPHATE 10 MG/ML IJ SOLN
INTRAMUSCULAR | Status: AC
  Filled 2019-09-13: qty 1

## 2019-09-13 MED ORDER — INSULIN ASPART 100 UNIT/ML ~~LOC~~ SOLN: 0.0000 [IU] | Freq: Four times a day (QID) | SUBCUTANEOUS | Status: DC

## 2019-09-13 MED ORDER — ONDANSETRON HCL 4 MG/2ML IJ SOLN
INTRAMUSCULAR | Status: AC
  Filled 2019-09-13: qty 2

## 2019-09-13 MED ORDER — ADULT MULTIVITAMIN W/MINERALS CH: 1.0000 | ORAL_TABLET | Freq: Every day | ORAL | Status: DC

## 2019-09-13 MED ORDER — ALBUTEROL SULFATE (2.5 MG/3ML) 0.083% IN NEBU
2.5000 mg | INHALATION_SOLUTION | RESPIRATORY_TRACT | Status: DC
  Administered 2019-09-14: 2.5 mg via RESPIRATORY_TRACT
  Filled 2019-09-13: qty 3

## 2019-09-13 MED ORDER — ONDANSETRON HCL 4 MG/2ML IJ SOLN
INTRAMUSCULAR | Status: DC | PRN
  Administered 2019-09-13: 4 mg via INTRAVENOUS

## 2019-09-13 MED ORDER — DEXAMETHASONE SODIUM PHOSPHATE 10 MG/ML IJ SOLN
INTRAMUSCULAR | Status: DC | PRN
  Administered 2019-09-13: 5 mg via INTRAVENOUS

## 2019-09-13 MED ORDER — PHENYLEPHRINE HCL-NACL 10-0.9 MG/250ML-% IV SOLN
INTRAVENOUS | Status: DC | PRN
  Administered 2019-09-13: 20 ug/min via INTRAVENOUS

## 2019-09-13 MED ORDER — ONDANSETRON HCL 4 MG PO TABS: 4.0000 mg | ORAL_TABLET | Freq: Four times a day (QID) | ORAL | Status: DC | PRN

## 2019-09-13 MED ORDER — ROCURONIUM BROMIDE 10 MG/ML (PF) SYRINGE
PREFILLED_SYRINGE | INTRAVENOUS | Status: AC
  Filled 2019-09-13: qty 10

## 2019-09-13 MED ORDER — LIDOCAINE 2% (20 MG/ML) 5 ML SYRINGE
INTRAMUSCULAR | Status: DC | PRN
  Administered 2019-09-13: 50 mg via INTRAVENOUS

## 2019-09-13 MED ORDER — OXYCODONE HCL 5 MG PO TABS
5.0000 mg | ORAL_TABLET | ORAL | Status: DC | PRN
  Administered 2019-09-14 (×2): 10 mg via ORAL
  Filled 2019-09-13 (×2): qty 2

## 2019-09-13 MED ORDER — THIAMINE HCL 100 MG PO TABS
100.0000 mg | ORAL_TABLET | Freq: Every day | ORAL | Status: DC
  Administered 2019-09-14 – 2019-09-16 (×3): 100 mg via ORAL
  Filled 2019-09-13 (×3): qty 1

## 2019-09-13 MED ORDER — ROCURONIUM BROMIDE 10 MG/ML (PF) SYRINGE
PREFILLED_SYRINGE | INTRAVENOUS | Status: DC | PRN
  Administered 2019-09-13: 20 mg via INTRAVENOUS
  Administered 2019-09-13: 50 mg via INTRAVENOUS

## 2019-09-13 MED ORDER — THIAMINE HCL 100 MG/ML IJ SOLN: 100.0000 mg | Freq: Every day | INTRAMUSCULAR | Status: DC

## 2019-09-13 MED ORDER — ACETAMINOPHEN 325 MG PO TABS: 650.0000 mg | ORAL_TABLET | Freq: Four times a day (QID) | ORAL | Status: DC | PRN

## 2019-09-13 MED ORDER — TRAMADOL HCL 50 MG PO TABS
50.0000 mg | ORAL_TABLET | Freq: Three times a day (TID) | ORAL | Status: DC | PRN
  Filled 2019-09-13: qty 1

## 2019-09-13 MED ORDER — ESMOLOL HCL 100 MG/10ML IV SOLN
INTRAVENOUS | Status: DC | PRN
  Administered 2019-09-13: 20 mg via INTRAVENOUS

## 2019-09-13 MED ORDER — BISACODYL 10 MG RE SUPP: 10.0000 mg | Freq: Every day | RECTAL | Status: DC | PRN

## 2019-09-13 MED ORDER — MORPHINE SULFATE (PF) 2 MG/ML IV SOLN
1.0000 mg | INTRAVENOUS | Status: DC | PRN
  Administered 2019-09-13: 1 mg via INTRAVENOUS
  Filled 2019-09-13: qty 1

## 2019-09-13 MED ORDER — ACETAMINOPHEN 160 MG/5ML PO SOLN
1000.0000 mg | Freq: Four times a day (QID) | ORAL | Status: DC
  Administered 2019-09-14: 1000 mg via ORAL
  Filled 2019-09-13: qty 40.6

## 2019-09-13 MED ORDER — NICOTINE 14 MG/24HR TD PT24
14.0000 mg | MEDICATED_PATCH | Freq: Every day | TRANSDERMAL | Status: DC
  Administered 2019-09-13 – 2019-09-16 (×4): 14 mg via TRANSDERMAL
  Filled 2019-09-13 (×4): qty 1

## 2019-09-13 SURGICAL SUPPLY — 100 items
ADH SKN CLS APL DERMABOND .7 (GAUZE/BANDAGES/DRESSINGS)
ADH SKN CLS LQ APL DERMABOND (GAUZE/BANDAGES/DRESSINGS) ×2
APL SRG 22X2 LUM MLBL SLNT (VASCULAR PRODUCTS)
APL SRG 7X2 LUM MLBL SLNT (VASCULAR PRODUCTS)
APL SWBSTK 6 STRL LF DISP (MISCELLANEOUS) ×2
APPLICATOR COTTON TIP 6 STRL (MISCELLANEOUS) IMPLANT
APPLICATOR COTTON TIP 6IN STRL (MISCELLANEOUS) ×3
APPLICATOR TIP COSEAL (VASCULAR PRODUCTS) IMPLANT
APPLICATOR TIP EXT COSEAL (VASCULAR PRODUCTS) IMPLANT
BLADE CLIPPER SURG (BLADE) ×2 IMPLANT
BLADE SURG 11 STRL SS (BLADE) ×2 IMPLANT
CANISTER SUCT 3000ML PPV (MISCELLANEOUS) ×3 IMPLANT
CATH KIT ON-Q SILVERSOAK 5 (CATHETERS) IMPLANT
CATH KIT ON-Q SILVERSOAK 5IN (CATHETERS) IMPLANT
CATH THORACIC 28FR (CATHETERS) IMPLANT
CATH THORACIC 36FR (CATHETERS) IMPLANT
CATH THORACIC 36FR RT ANG (CATHETERS) IMPLANT
CLEANER TIP ELECTROSURG 2X2 (MISCELLANEOUS) ×2 IMPLANT
CLIP VESOCCLUDE MED 6/CT (CLIP) IMPLANT
CNTNR URN SCR LID CUP LEK RST (MISCELLANEOUS) ×4 IMPLANT
CONN ST 1/4X3/8  BEN (MISCELLANEOUS) ×3
CONN ST 1/4X3/8 BEN (MISCELLANEOUS) IMPLANT
CONN Y 3/8X3/8X3/8  BEN (MISCELLANEOUS)
CONN Y 3/8X3/8X3/8 BEN (MISCELLANEOUS) IMPLANT
CONT SPEC 4OZ STRL OR WHT (MISCELLANEOUS) ×9
COVER SURGICAL LIGHT HANDLE (MISCELLANEOUS) ×2 IMPLANT
DERMABOND ADHESIVE PROPEN (GAUZE/BANDAGES/DRESSINGS) ×1
DERMABOND ADVANCED (GAUZE/BANDAGES/DRESSINGS)
DERMABOND ADVANCED .7 DNX12 (GAUZE/BANDAGES/DRESSINGS) IMPLANT
DERMABOND ADVANCED .7 DNX6 (GAUZE/BANDAGES/DRESSINGS) IMPLANT
DISSECTOR BLUNT TIP ENDO 5MM (MISCELLANEOUS) IMPLANT
DRAIN CHANNEL 28F RND 3/8 FF (WOUND CARE) ×1 IMPLANT
DRAPE LAPAROSCOPIC ABDOMINAL (DRAPES) ×3 IMPLANT
DRAPE WARM FLUID 44X44 (DRAPES) ×2 IMPLANT
ELECT BLADE 4.0 EZ CLEAN MEGAD (MISCELLANEOUS) ×3
ELECT REM PT RETURN 9FT ADLT (ELECTROSURGICAL) ×3
ELECTRODE BLDE 4.0 EZ CLN MEGD (MISCELLANEOUS) ×2 IMPLANT
ELECTRODE REM PT RTRN 9FT ADLT (ELECTROSURGICAL) ×2 IMPLANT
FILTER SMOKE EVAC ULPA (FILTER) ×2 IMPLANT
GAUZE SPONGE 4X4 12PLY STRL (GAUZE/BANDAGES/DRESSINGS) ×3 IMPLANT
GLOVE BIO SURGEON STRL SZ 6.5 (GLOVE) ×7 IMPLANT
GLOVE BIO SURGEON STRL SZ7.5 (GLOVE) ×1 IMPLANT
GLOVE BIOGEL PI IND STRL 6 (GLOVE) IMPLANT
GLOVE BIOGEL PI INDICATOR 6 (GLOVE) ×1
GOWN STRL REUS W/ TWL LRG LVL3 (GOWN DISPOSABLE) ×6 IMPLANT
GOWN STRL REUS W/TWL LRG LVL3 (GOWN DISPOSABLE) ×12
KIT BASIN OR (CUSTOM PROCEDURE TRAY) ×3 IMPLANT
KIT SUCTION CATH 14FR (SUCTIONS) ×3 IMPLANT
KIT TURNOVER KIT B (KITS) ×3 IMPLANT
NEEDLE 22X1 1/2 (OR ONLY) (NEEDLE) ×1 IMPLANT
NS IRRIG 1000ML POUR BTL (IV SOLUTION) ×10 IMPLANT
PACK CHEST (CUSTOM PROCEDURE TRAY) ×3 IMPLANT
PAD ARMBOARD 7.5X6 YLW CONV (MISCELLANEOUS) ×6 IMPLANT
PASSER SUT SWANSON 36MM LOOP (INSTRUMENTS) IMPLANT
PENCIL SMOKE EVACUATOR (MISCELLANEOUS) ×3 IMPLANT
SCISSORS LAP 5X35 DISP (ENDOMECHANICALS) IMPLANT
SEALANT PROGEL (MISCELLANEOUS) IMPLANT
SEALANT SURG COSEAL 4ML (VASCULAR PRODUCTS) IMPLANT
SEALANT SURG COSEAL 8ML (VASCULAR PRODUCTS) IMPLANT
SLEEVE SUCTION 125 (MISCELLANEOUS) ×3 IMPLANT
SOL ANTI FOG 6CC (MISCELLANEOUS) ×2 IMPLANT
SOLUTION ANTI FOG 6CC (MISCELLANEOUS) ×1
STOPCOCK 3 WAY HIGH PRESSURE (MISCELLANEOUS) ×3
STOPCOCK 3WAY HIGH PRESSURE (MISCELLANEOUS) IMPLANT
SUT PROLENE 3 0 SH DA (SUTURE) IMPLANT
SUT PROLENE 4 0 RB 1 (SUTURE)
SUT PROLENE 4-0 RB1 .5 CRCL 36 (SUTURE) IMPLANT
SUT SILK  1 MH (SUTURE) ×6
SUT SILK 1 MH (SUTURE) ×8 IMPLANT
SUT SILK 1 TIES 10X30 (SUTURE) IMPLANT
SUT SILK 2 0SH CR/8 30 (SUTURE) IMPLANT
SUT SILK 3 0SH CR/8 30 (SUTURE) IMPLANT
SUT VIC AB 1 CTX 18 (SUTURE) IMPLANT
SUT VIC AB 1 CTX 36 (SUTURE)
SUT VIC AB 1 CTX36XBRD ANBCTR (SUTURE) IMPLANT
SUT VIC AB 2-0 CTX 36 (SUTURE) IMPLANT
SUT VIC AB 2-0 UR6 27 (SUTURE) ×1 IMPLANT
SUT VIC AB 3-0 X1 27 (SUTURE) ×1 IMPLANT
SUT VICRYL 0 UR6 27IN ABS (SUTURE) IMPLANT
SUT VICRYL 2 TP 1 (SUTURE) IMPLANT
SWAB COLLECTION DEVICE MRSA (MISCELLANEOUS) IMPLANT
SWAB CULTURE ESWAB REG 1ML (MISCELLANEOUS) IMPLANT
SYR 10ML LL (SYRINGE) ×1 IMPLANT
SYR 30ML LL (SYRINGE) ×2 IMPLANT
SYSTEM SAHARA CHEST DRAIN ATS (WOUND CARE) ×3 IMPLANT
TAPE CLOTH 4X10 WHT NS (GAUZE/BANDAGES/DRESSINGS) ×3 IMPLANT
TAPE CLOTH SURG 4X10 WHT LF (GAUZE/BANDAGES/DRESSINGS) ×1 IMPLANT
TAPE UMBILICAL COTTON 1/8X30 (MISCELLANEOUS) ×2 IMPLANT
TIP APPLICATOR SPRAY EXTEND 16 (VASCULAR PRODUCTS) IMPLANT
TOWEL GREEN STERILE (TOWEL DISPOSABLE) ×3 IMPLANT
TOWEL GREEN STERILE FF (TOWEL DISPOSABLE) ×3 IMPLANT
TRAP FLUID SMOKE EVACUATOR (MISCELLANEOUS) ×3 IMPLANT
TRAP SPECIMEN MUCOUS 40CC (MISCELLANEOUS) ×2 IMPLANT
TRAY FOLEY SLVR 16FR LF STAT (SET/KITS/TRAYS/PACK) ×3 IMPLANT
TROCAR XCEL 12X100 BLDLESS (ENDOMECHANICALS) ×3 IMPLANT
TROCAR XCEL BLADELESS 5X75MML (TROCAR) ×2 IMPLANT
TUBING EXTENTION W/L.L. (IV SETS) ×1 IMPLANT
TUBING LAP HI FLOW INSUFFLATIO (TUBING) ×1 IMPLANT
TUNNELER SHEATH ON-Q 11GX8 DSP (PAIN MANAGEMENT) IMPLANT
WATER STERILE IRR 1000ML POUR (IV SOLUTION) ×6 IMPLANT

## 2019-09-13 NOTE — Consult Note (Signed)
[address]       [name] [address]             [id number]                    Gloria Santana [place] Medical Record #[id number] Date of Birth: 02/28/1956  Referring: No ref. provider found Primary Care: Rosita Fire, MD Primary Cardiologist: No primary care provider on file.  Chief Complaint:    Chief Complaint  Patient presents with  . GI Bleeding    History of Present Illness:    Gloria Santana 64 y.o. female was seen in the [facility] emergency room last night.  Her daughter notes that over the past week she has become increasingly lethargic less active and on Sunday noted dark stools.  The daughter made her appointment with primary care yesterday-she was seen in the primary care office stool was checked for blood and she was told to go to the [facility] emergency room because of blood in her stool.  On arrival in the [facility] emergency room was noted that her left side was tender, she was told stool guaiacs were negative.  Her evaluation continued with a chest x-ray that showed a left pleural effusion, subsequent CT suggested multiple rib fractures on the left with a hemothorax.  After discussion with the trauma service here at [facility], the [facility] emergency room placed a pigtail catheter in the left chest.  Unfortunately this was directed superiorly and has not drained anything.  Patient was transferred to [facility] for further care.  Notes that 2 weeks prior to her ER visit she had fallen while going to the bathroom at night between the toilet and sink-got stuck and was unable to get up until her husband came home from work  Patient notes she has had no previous cardiac history.  However she is a long-term smoker and uses alcohol on a regular basis  Current Activity/ Functional Status:  Patient is independent with mobility/ambulation, transfers, ADL's, IADL's.   Zubrod Score: At the time of surgery this patient's most appropriate activity  status/level should be described as: []     0    Normal activity, no symptoms []     1    Restricted in physical strenuous activity but ambulatory, able to do out light work [x]     2    Ambulatory and capable of self care, unable to do work activities, up and about               >50 % of waking hours                              []     3    Only limited self care, in bed greater than 50% of waking hours []     4    Completely disabled, no self care, confined to bed or chair []     5    Moribund   Past Medical History:  Diagnosis Date  . Alcohol dependence ([place])   . Anemia   . COPD (chronic obstructive pulmonary disease) ([place])   . Diabetes mellitus without complication ([place])    Pt states she is not diabetic  . Hypertension   . Poor circulation     Past Surgical History:  Procedure Laterality Date  . ABDOMINAL HYSTERECTOMY      Family History  Problem Relation  Age of Onset  . Diabetes Mother   . Colon cancer Neg Hx   . Colon polyps Neg Hx      Social History   Tobacco Use  Smoking Status Current Every Day Smoker  . Packs/day: 2.00  . Years: 35.00  . Pack years: 70.00  . Types: Cigarettes  Smokeless Tobacco Never Used    Social History   Substance and Sexual Activity  Alcohol Use Yes  . Alcohol/week: 14.0 standard drinks  . Types: 14 Cans of beer per week     No Known Allergies  Current Facility-Administered Medications  Medication Dose Route Frequency Provider Last Rate Last Admin  . 0.9 %  sodium chloride infusion  250 mL Intravenous PRN Olga Coaster, MD      . acetaminophen (TYLENOL) tablet 650 mg  650 mg Oral Q6H PRN Olga Coaster, MD       Or  . acetaminophen (TYLENOL) suppository 650 mg  650 mg Rectal Q6H PRN Olga Coaster, MD      . albuterol (PROVENTIL) (2.5 MG/3ML) 0.083% nebulizer solution 3 mL  3 mL Inhalation Q4H PRN Olga Coaster, MD      . bisacodyl (DULCOLAX) suppository 10 mg  10 mg Rectal Daily PRN Olga Coaster, MD      .  folic acid (FOLVITE) tablet 1 mg  1 mg Oral Daily Delia Chimes M, MD      . insulin aspart (novoLOG) injection 0-6 Units  0-6 Units Subcutaneous Q6H Olga Coaster, MD      . lactated ringers infusion   Intravenous Continuous Olga Coaster, MD 75 mL/hr at 09/13/19 0245 New Bag at 09/13/19 0245  . LORazepam (ATIVAN) tablet 1-4 mg  1-4 mg Oral Q1H PRN Olga Coaster, MD       Or  . LORazepam (ATIVAN) injection 1-4 mg  1-4 mg Intravenous Q1H PRN Olga Coaster, MD      . morphine 2 MG/ML injection 1 mg  1 mg Intravenous Q4H PRN Olga Coaster, MD   1 mg at 09/13/19 0308  . multivitamin with minerals tablet 1 tablet  1 tablet Oral Daily Olga Coaster, MD      . nicotine (NICODERM CQ - dosed in mg/24 hours) patch 14 mg  14 mg Transdermal Daily Olga Coaster, MD   14 mg at 09/13/19 0307  . ondansetron (ZOFRAN) tablet 4 mg  4 mg Oral Q6H PRN Olga Coaster, MD       Or  . ondansetron [facility]) injection 4 mg  4 mg Intravenous Q6H PRN Olga Coaster, MD      . pantoprazole (PROTONIX) injection 40 mg  40 mg Intravenous QHS Olga Coaster, MD      . polyethylene glycol (MIRALAX / GLYCOLAX) packet 17 g  17 g Oral Daily PRN Olga Coaster, MD      . sodium chloride flush (NS) 0.9 % injection 3 mL  3 mL Intravenous Q12H Olga Coaster, MD   3 mL at 09/13/19 0247  . sodium chloride flush (NS) 0.9 % injection 3 mL  3 mL Intravenous PRN Olga Coaster, MD      . thiamine tablet 100 mg  100 mg Oral Daily Olga Coaster, MD       Or  . thiamine (B-1) injection 100 mg  100 mg Intravenous Daily Olga Coaster, MD      . traMADol Janean Sark) tablet 50 mg  50 mg Oral Q8H PRN Olga CoasterGadhia, Shardul M, MD        Pertinent items are noted in HPI.   Review of Systems:     Cardiac Review of Systems: [Y] = yes  or   [ N ] = no   Chest Pain [  y  ]  Resting SOB [n   ] Exertional SOB  [ y ]  Pollyann Kennedyrthopnea Milo.Brash[n  ]   Pedal Edema [ n  ]    Palpitations [  n] Syncope  Milo.Brash[n  ]   Presyncope [  n  ]   General Review of Systems: [Y] = yes [  ]=no Constitional: recent weight change [  ];  Wt loss over the last 3 months [   ] anorexia [  ]; fatigue [  ]; nausea [  ]; night sweats [  ]; fever [  ]; or chills [  ];           Eye : blurred vision [  ]; diplopia [   ]; vision changes [  ];  Amaurosis fugax[  ]; Resp: cough [  ];  wheezing[  ];  hemoptysis[  ]; shortness of breath[  ]; paroxysmal nocturnal dyspnea[  ]; dyspnea on exertion[  ]; or orthopnea[  ];  GI:  gallstones[  ], vomiting[  ];  dysphagia[  ]; melena[  ];  hematochezia [  ]; heartburn[  ];   Hx of  Colonoscopy[  ]; GU: kidney stones [  ]; hematuria[  ];   dysuria [  ];  nocturia[  ];  history of     obstruction [  ]; urinary frequency [  ]             Skin: rash, swelling[  ];, hair loss[  ];  peripheral edema[  ];  or itching[  ]; Musculosketetal: myalgias[  ];  joint swelling[  ];  joint erythema[  ];  joint pain[  ];  back pain[  ];  Heme/Lymph: bruising[  ];  bleeding[  ];  anemia[  ];  Neuro: TIA[  ];  headaches[  ];  stroke[  ];  vertigo[  ];  seizures[  ];   paresthesias[  ];  difficulty walking[ y ];  Psych:depression[  ]; anxiety[  ];  Endocrine: diabetes[  ];  thyroid dysfunction[  ];  Immunizations: Flu up to date [  ]; Pneumococcal up to date [  ];  Other:      PHYSICAL EXAMINATION: BP (!) 127/91 (BP Location: Right Arm)   Pulse (!) 104   Temp 98.3 F (36.8 C) (Oral)   Resp 17   Ht 5\' 7"  (1.702 m)   Wt 50.9 kg   SpO2 100%   BMI 17.58 kg/m  General appearance: alert, cooperative, appears older than stated age and cachectic Head: Normocephalic, without obvious abnormality, atraumatic Neck: no adenopathy, no carotid bruit, no JVD, supple, symmetrical, trachea midline and thyroid not enlarged, symmetric, no tenderness/mass/nodules Lymph nodes: Cervical, supraclavicular, and axillary nodes normal. Resp: diminished breath sounds LLL Back: symmetric, no curvature. ROM normal. No CVA tenderness. Cardio:  regular rate and rhythm, S1, S2 normal, no murmur, click, rub or gallop GI: soft, non-tender; bowel sounds normal; no masses,  no organomegaly Extremities: extremities normal, atraumatic, no cyanosis or edema and Homans sign is negative, no sign of DVT Neurologic: Grossly normal  Diagnostic Studies & Laboratory data:     Recent Radiology Findings:   DG Ribs Unilateral W/Chest  Left  Result Date: 09/12/2019 CLINICAL DATA:  Left axillary rib pain, fell 2 weeks ago EXAM: LEFT RIBS AND CHEST - 3+ VIEW COMPARISON:  01/05/2019 FINDINGS: Frontal and oblique views of the left thoracic cage are obtained. There are minimally displaced left lateral ninth and tenth rib fractures. Small left pleural effusion likely reflects hemothorax. No pneumothorax. Consolidation left lung base likely atelectasis. Cardiac silhouette is unremarkable. IMPRESSION: 1. Displaced left lateral ninth and tenth rib fractures, with small left pleural effusion likely reflecting hemothorax. 2. No evidence of pneumothorax. Electronically Signed   By: Sharlet Salina M.D.   On: 09/12/2019 16:48   CT Chest W Contrast  Result Date: 09/12/2019 CLINICAL DATA:  Abdominal pain. Pain status post fall 2 weeks ago. Left-sided rib cage pain EXAM: CT CHEST, ABDOMEN, AND PELVIS WITH CONTRAST TECHNIQUE: Multidetector CT imaging of the chest, abdomen and pelvis was performed following the standard protocol during bolus administration of intravenous contrast. CONTRAST:  60mL OMNIPAQUE IOHEXOL 300 MG/ML  SOLN COMPARISON:  CT chest dated 04/13/2013. FINDINGS: CT CHEST FINDINGS Cardiovascular: The heart size is normal. The ascending thoracic aorta is ectatic measuring approximately 3.9 cm in diameter. There is no evidence for dissection. The arch vessels are grossly patent where visualized. The main pulmonary artery is within normal limits. There is no large centrally located pulmonary embolism. Coronary artery calcifications are noted. There are minimal  calcifications of the thoracic aorta. Mediastinum/Nodes: --No mediastinal or hilar lymphadenopathy. --No axillary lymphadenopathy. --No supraclavicular lymphadenopathy. --multiple thyroid nodules are noted. The largest thyroid nodule measures up to approximately 1 cm (axial series 2, image 13). No followup recommended (ref: J Am Coll Radiol. 2015 Feb;12(2): 143-50). --The esophagus is unremarkable Lungs/Pleura: There is a large left-sided pleural effusion. The pleural fluid measures approximately 28-30 Hounsfield units consistent with a probable left-sided hemothorax. There is partial collapse of the left lower lobe. There is no pneumothorax. Mild emphysematous changes are noted. There is scattered bilateral micro nodules that appears centrally stable since the patient's prior study in 2014 and as such no further follow-up is required. Musculoskeletal: There are multiple acute to subacute displaced and nondisplaced left-sided rib fractures. The rib fractures involve the anterior sixth rib on the patient's left in addition to the posterior eighth through twelfth ribs. There are segmental fractures involving the eighth through eleventh ribs. There is a chronic appearing fracture of the superior sternal body. CT ABDOMEN PELVIS FINDINGS Hepatobiliary: There is severe hepatic steatosis. Cholelithiasis without acute inflammation.There is no biliary ductal dilation. Pancreas: There are multiple calcifications throughout the patient's pancreas consistent with chronic pancreatitis. Spleen: No splenic laceration or hematoma. Adrenals/Urinary Tract: --Adrenal glands: No adrenal hemorrhage. --Right kidney/ureter: No hydronephrosis or perinephric hematoma. --Left kidney/ureter: No hydronephrosis or perinephric hematoma. --Urinary bladder: Unremarkable. Stomach/Bowel: --Stomach/Duodenum: No hiatal hernia or other gastric abnormality. Normal duodenal course and caliber. --Small bowel: No dilatation or inflammation. --Colon: No  focal abnormality. --Appendix: Normal. Vascular/Lymphatic: Atherosclerotic calcification is present within the non-aneurysmal abdominal aorta, without hemodynamically significant stenosis. --No retroperitoneal lymphadenopathy. --No mesenteric lymphadenopathy. --No pelvic or inguinal lymphadenopathy. Reproductive: Status post hysterectomy. No adnexal mass. Other: No ascites or free air. There is a fat containing umbilical hernia. Musculoskeletal. There are age-indeterminate compression fractures of the L3 and L4 vertebral bodies with approximately 20% height loss at the L3 level and 25% height loss at the L4 level. Both of these fractures are favored to be acute to subacute. There are 5 non rib-bearing lumbar type vertebral bodies. There is grade 1 anterolisthesis of  L4 on L5. IMPRESSION: 1. Large left-sided pleural effusion with partial collapse of the left lower lobe. There is increased density involving this left-sided pleural effusion suggesting that may in fact represent a left-sided hemothorax related to the patient's left-sided rib fractures. 2. Multiple acute to subacute displaced and nondisplaced left-sided rib fractures as detailed above. There are segmental fractures of the eighth through eleventh ribs on the left. 3. Age-indeterminate compression fractures of the L3 and L4 vertebral bodies. These are favored to be acute to subacute but should be correlated with point tenderness. 4. Hepatic steatosis. 5. Cholelithiasis without acute inflammation. 6. Chronic pancreatitis. Aortic Atherosclerosis (ICD10-I70.0). Electronically Signed   By: Katherine Mantle M.D.   On: 09/12/2019 20:23   CT Abdomen Pelvis W Contrast  Result Date: 09/12/2019 CLINICAL DATA:  Abdominal pain. Pain status post fall 2 weeks ago. Left-sided rib cage pain EXAM: CT CHEST, ABDOMEN, AND PELVIS WITH CONTRAST TECHNIQUE: Multidetector CT imaging of the chest, abdomen and pelvis was performed following the standard protocol during bolus  administration of intravenous contrast. CONTRAST:  40mL OMNIPAQUE IOHEXOL 300 MG/ML  SOLN COMPARISON:  CT chest dated 04/13/2013. FINDINGS: CT CHEST FINDINGS Cardiovascular: The heart size is normal. The ascending thoracic aorta is ectatic measuring approximately 3.9 cm in diameter. There is no evidence for dissection. The arch vessels are grossly patent where visualized. The main pulmonary artery is within normal limits. There is no large centrally located pulmonary embolism. Coronary artery calcifications are noted. There are minimal calcifications of the thoracic aorta. Mediastinum/Nodes: --No mediastinal or hilar lymphadenopathy. --No axillary lymphadenopathy. --No supraclavicular lymphadenopathy. --multiple thyroid nodules are noted. The largest thyroid nodule measures up to approximately 1 cm (axial series 2, image 13). No followup recommended (ref: J Am Coll Radiol. 2015 Feb;12(2): 143-50). --The esophagus is unremarkable Lungs/Pleura: There is a large left-sided pleural effusion. The pleural fluid measures approximately 28-30 Hounsfield units consistent with a probable left-sided hemothorax. There is partial collapse of the left lower lobe. There is no pneumothorax. Mild emphysematous changes are noted. There is scattered bilateral micro nodules that appears centrally stable since the patient's prior study in 2014 and as such no further follow-up is required. Musculoskeletal: There are multiple acute to subacute displaced and nondisplaced left-sided rib fractures. The rib fractures involve the anterior sixth rib on the patient's left in addition to the posterior eighth through twelfth ribs. There are segmental fractures involving the eighth through eleventh ribs. There is a chronic appearing fracture of the superior sternal body. CT ABDOMEN PELVIS FINDINGS Hepatobiliary: There is severe hepatic steatosis. Cholelithiasis without acute inflammation.There is no biliary ductal dilation. Pancreas: There are  multiple calcifications throughout the patient's pancreas consistent with chronic pancreatitis. Spleen: No splenic laceration or hematoma. Adrenals/Urinary Tract: --Adrenal glands: No adrenal hemorrhage. --Right kidney/ureter: No hydronephrosis or perinephric hematoma. --Left kidney/ureter: No hydronephrosis or perinephric hematoma. --Urinary bladder: Unremarkable. Stomach/Bowel: --Stomach/Duodenum: No hiatal hernia or other gastric abnormality. Normal duodenal course and caliber. --Small bowel: No dilatation or inflammation. --Colon: No focal abnormality. --Appendix: Normal. Vascular/Lymphatic: Atherosclerotic calcification is present within the non-aneurysmal abdominal aorta, without hemodynamically significant stenosis. --No retroperitoneal lymphadenopathy. --No mesenteric lymphadenopathy. --No pelvic or inguinal lymphadenopathy. Reproductive: Status post hysterectomy. No adnexal mass. Other: No ascites or free air. There is a fat containing umbilical hernia. Musculoskeletal. There are age-indeterminate compression fractures of the L3 and L4 vertebral bodies with approximately 20% height loss at the L3 level and 25% height loss at the L4 level. Both of these fractures are favored to be  acute to subacute. There are 5 non rib-bearing lumbar type vertebral bodies. There is grade 1 anterolisthesis of L4 on L5. IMPRESSION: 1. Large left-sided pleural effusion with partial collapse of the left lower lobe. There is increased density involving this left-sided pleural effusion suggesting that may in fact represent a left-sided hemothorax related to the patient's left-sided rib fractures. 2. Multiple acute to subacute displaced and nondisplaced left-sided rib fractures as detailed above. There are segmental fractures of the eighth through eleventh ribs on the left. 3. Age-indeterminate compression fractures of the L3 and L4 vertebral bodies. These are favored to be acute to subacute but should be correlated with point  tenderness. 4. Hepatic steatosis. 5. Cholelithiasis without acute inflammation. 6. Chronic pancreatitis. Aortic Atherosclerosis (ICD10-I70.0). Electronically Signed   By: Katherine Mantle M.D.   On: 09/12/2019 20:23   DG Chest Portable 1 View  Result Date: 09/12/2019 CLINICAL DATA:  Status post chest tube placement EXAM: PORTABLE CHEST 1 VIEW COMPARISON:  Films from earlier in the same day. FINDINGS: Cardiac shadow is stable in appearance. Left-sided pleural effusion is again identified. Left chest tube is now noted in place. No pneumothorax is seen. Right lung remains clear. Cardiac shadow is stable. Aortic calcifications are noted. Previously seen left rib fractures are again noted. IMPRESSION: Interval chest tube placement. Persistent effusion is noted on the left. Left rib fractures are seen without pneumothorax. Electronically Signed   By: Alcide Clever M.D.   On: 09/12/2019 23:05     I have independently reviewed the above radiology studies  and reviewed the findings with the patient.   Recent Lab Findings: Lab Results  Component Value Date   WBC 4.4 09/12/2019   HGB 11.4 (L) 09/12/2019   HCT 34.1 (L) 09/12/2019   PLT 116 (L) 09/12/2019   GLUCOSE 96 09/12/2019   ALT 21 09/12/2019   AST 65 (H) 09/12/2019   NA 126 (L) 09/12/2019   K 3.6 09/12/2019   CL 88 (L) 09/12/2019   CREATININE 0.49 09/12/2019   BUN 7 (L) 09/12/2019   CO2 26 09/12/2019   INR 1.0 09/05/2018      Assessment / Plan:   #1 2 weeks status post fall at home with multiple left rib fractures and clotted hemothorax-not draining with placement of chest tube I discussed with the patient and her daughter the x-ray findings and the diagnosis of probable hemothorax.  The option of proceeding with video-assisted thoracoscopy for drainage of the empyema was suggested to the patient and her daughter as the most expedient way to get drainage.  We discussed possible second placement of a chest tube by radiology under CT guidance  could be attempted, though less likely to be successful since it is now been almost 2 weeks since the injury occurred.  Risks and options of surgical drainage were discussed with the patient and her daughter and she is willing to proceed. #2 conflicting evidence as to presence or absence of GI bleed will need to be evaluated further #3 long-term tobacco use with significant COPD #4 diabetes #5 history of alcohol abuse The goals risks and alternatives of the planned surgical procedure Procedure(s): VIDEO ASSISTED THORACOSCOPY (VATS) drainage of /EMPYEMA (Left)  have been discussed with the patient in detail. The risks of the procedure including death, infection, stroke, myocardial infarction, bleeding, blood transfusion have all been discussed specifically.  I have quoted Gloria Santana a 5 % of perioperative mortality and a complication rate as high as 40 %. The patient's questions  have been answered.Gloria Santana is willing  to proceed with the planned procedure.     Delight Ovens MD      [address] [place].[address] [place] [address] Office [phone]     09/13/2019 7:26 AM

## 2019-09-13 NOTE — Plan of Care (Signed)
  Problem: Education: Goal: Knowledge of General Education information will improve Description: Including pain rating scale, medication(s)/side effects and non-pharmacologic comfort measures Outcome: Progressing   Problem: Clinical Measurements: Goal: Ability to maintain clinical measurements within normal limits will improve Outcome: Progressing   Problem: Clinical Measurements: Goal: Will remain free from infection Outcome: Progressing   Problem: Clinical Measurements: Goal: Diagnostic test results will improve Outcome: Progressing   Problem: Clinical Measurements: Goal: Respiratory complications will improve Outcome: Progressing   Problem: Nutrition: Goal: Adequate nutrition will be maintained Outcome: Progressing   Problem: Coping: Goal: Level of anxiety will decrease Outcome: Progressing   Problem: Pain Managment: Goal: General experience of comfort will improve Outcome: Progressing   Problem: Safety: Goal: Ability to remain free from injury will improve Outcome: Progressing

## 2019-09-13 NOTE — ED Notes (Signed)
PT depart with [facility], Attempt to give report to floor. States they will call back., rn in room

## 2019-09-13 NOTE — H&P (Signed)
History and Physical    Patient Demographics:    Gloria Santana DVV:[id number] DOB: 1955/12/01 DOA: 09/12/2019  PCP: Avon Gully, MD  Patient coming from: Home  I have personally briefly reviewed patient's old medical records in [facility]  Chief Complaint: Fall   Assessment & Plan:     Assessment/Plan Principal Problem:   Hemothorax Active Problems:   Rib fracture   COPD (chronic obstructive pulmonary disease) (HCC)     Principal Problem: Left-sided traumatic hemothorax with multiple rib fractures Patient sustained blunt left chest wall trauma following a mechanical fall about 2 weeks ago at home.  Has been having left-sided chest wall pain since the fall.  Evaluation in the ER showed multiple left-sided acute and subacute displaced and nondisplaced rib fractures involving the anterior sixth rib, posterior 8-12 ribs, segmental 8-11 ribs and a chronic appearing fracture of the superior sternal body.  CT showed a moderate to large left-sided hemothorax.  Chest tube placement in the ER only revealed 50 cc of reddish fluid.  Case was discussed with trauma surgeron Dr. Bedelia Person who recommended transfer to [facility] for VATS procedure in the morning. She coordinated with Dr Tyrone Sage and he is planning on doing VATS in the morning. The trauma surgeon felt comfortable with a medical admission and was readily available for consult.  Patient is being admitted to the hospital service and being transferred to [facility]. -Pain meds as needed -Defer antiplatelets, anticoagulation -Cardiothoracic, trauma surgery consultation -We will keep n.p.o.  Other Active Problems: Diabetes mellitus type 2 Patient is not on medications at home. -Sliding scale insulin coverage fingerstick monitoring  COPD Not in acute exacerbation -DuoNebs as needed  Nicotine dependence -We will place on nicotine patch  History of alcohol abuse Drinks several beers a day.  Does have occasional alcohol  withdrawal symptoms.  Last drink was a.m. of 09/12/2019.  Currently no withdrawal symptoms. -Multivitamin, thiamine, folic acid -CIWA protocol with Ativan as needed  Probable gastroesophageal reflux disease Patient has been having nausea -We will place on IV PPI  Chronic normocytic anemia Patient has hemoglobin of 11.4, has been having some mild bleeding hemorrhoids.  No other evidence of GI bleed.  Fecal occult blood is negative. -We will send anemia work-up  Mild hyponatremia Sodium 126.  Likely secondary to chronic alcohol use. -We will give gentle IV fluid resuscitation and monitor serum sodium  DVT prophylaxis: SCDs Code Status:  Full code Family Communication: N/A  Disposition Plan: Transfer to [facility] as inpatient for cardiothoracic and trauma surgery evaluation for VATS procedure Consults called: N/A Admission status: Inpatient status    HPI:     HPI: Gloria Santana is a 64 y.o. female with medical history significant of COPD, diabetes mellitus type 2, alcohol dependence who presented to the ER for evaluation of possible GI bleeding. Patient had apparently been having some dark-colored stool for the past 3 days as well as some blood in the toilet paper which he attributes to hemorrhoids.  Has had some mild associated nausea. Has a history of alcohol abuse and drinks at least 2-4 beers per day.  Does report some withdrawal symptoms when she stops drinking.  On inquiry, she does admit to having a mechanical fall 2 to 3 weeks ago when she fell between the commode in the bathtub and sustained injury to the left chest wall.  She has been having left chest wall pain since the fall.  Did not have loss of consciousness or head injury  at the time. No fever, chills, palpitations, significant dyspnea, hemoptysis. No hematemesis, abdominal pain, dyspnea, lightheadedness, dizziness, syncope. ED Course:  Vital Signs reviewed on presentation, significant for temperature 98.3, heart rate  108, blood pressure 117/81, saturation 97% on room air. Labs reviewed, significant for sodium 126, potassium 3.6, chloride 88, bicarb 26, BUN 7, creatinine 0.5, AST 65, total bilirubin 1.9.  WBC count 4.4, hemoglobin 11.4, hematocrit 34, platelets 116.  SARS-CoV-2 RT-PCR and flu PCR is negative.  Fecal occult blood is negative.  Urinalysis is negative. Imaging personally Reviewed, CT of the chest, abdomen, pelvis shows a large left-sided pleural effusion, probable left-sided hemothorax with partial collapse of the left lower lobe.  There are multiple acute subacute displaced and nondisplaced left-sided rib fractures involving the anterior sixth rib in addition to posterior 8-12 ribs as well as segmental fractures involving 8-11 ribs and a chronic appearing fracture of the superior sternal body.  Repeat chest x-ray shows interval chest tube placement with persistent effusion.    Review of systems:    Review of Systems: As per HPI otherwise 10 point review of systems negative.  All other review of systems is negative except the ones noted above in the HPI.    Past Medical and Surgical History:  Reviewed by me  Past Medical History:  Diagnosis Date  . Alcohol dependence (HCC)   . Anemia   . COPD (chronic obstructive pulmonary disease) (HCC)   . Diabetes mellitus without complication (HCC)    Pt states she is not diabetic  . Hypertension   . Poor circulation     Past Surgical History:  Procedure Laterality Date  . ABDOMINAL HYSTERECTOMY       Social History:  Reviewed by me   reports that she has been smoking cigarettes. She has a 70.00 pack-year smoking history. She has never used smokeless tobacco. She reports current alcohol use of about 14.0 standard drinks of alcohol per week. She reports that she does not use drugs.  Allergies:    No Known Allergies  Family History :   Family History  Problem Relation Age of Onset  . Diabetes Mother   . Colon cancer Neg Hx   . Colon  polyps Neg Hx    Family history reviewed, noted as above, not pertinent to current presentation.   Home Medications:    Prior to Admission medications   Medication Sig Start Date End Date Taking? Authorizing Provider  albuterol (VENTOLIN HFA) 108 (90 Base) MCG/ACT inhaler Inhale 1-2 puffs into the lungs every 6 (six) hours as needed for wheezing or shortness of breath.   Yes [provider]  ASPIRIN EC PO Take 81 mg by mouth once as needed for mild pain or moderate pain.   Yes [provider]    Physical Exam:    Physical Exam: Vitals:   09/12/19 2300 09/12/19 2330 09/13/19 0000 09/13/19 0030  BP: (!) 142/86 127/90 134/87 117/81  Pulse: 100     Resp:      Temp:      TempSrc:      SpO2: 97%     Weight:      Height:        Constitutional: NAD, calm, comfortable Vitals:   09/12/19 2300 09/12/19 2330 09/13/19 0000 09/13/19 0030  BP: (!) 142/86 127/90 134/87 117/81  Pulse: 100     Resp:      Temp:      TempSrc:      SpO2: 97%  Weight:      Height:       Eyes: PERRL, lids and conjunctivae normal ENMT: Mucous membranes are dry. Posterior pharynx clear of any exudate or lesions.Normal dentition.  Neck: normal, supple, no masses, no thyromegaly Respiratory: Decreased air entry on left base, point tenderness over multiple areas of the left chest wall, left-sided chest tube in place with bloody drainage noted, normal respiratory effort. No accessory muscle use.  Cardiovascular: Regular rate and rhythm, no murmurs / rubs / gallops. No extremity edema. 2+ pedal pulses. No carotid bruits.  Abdomen: no tenderness, no masses palpated. No hepatosplenomegaly. Bowel sounds positive.  Musculoskeletal: no clubbing / cyanosis. No joint deformity upper and lower extremities. Good ROM, no contractures. Normal muscle tone.  Skin: no rashes, lesions, ulcers. No induration Neurologic: CN 2-12 grossly intact. Sensation intact, DTR normal. Strength 5/5 in all 4.  Psychiatric:  Normal judgment and insight. Alert and oriented x 3. Normal mood.    Decubitus Ulcers: Not present on admission Catheters and tubes: None  Data Review:    Labs on Admission: I have personally reviewed following labs and imaging studies  CBC: Recent Labs  Lab 09/12/19 1605  WBC 4.4  NEUTROABS 2.7  HGB 11.4*  HCT 34.1*  MCV 90.5  PLT 116*   Basic Metabolic Panel: Recent Labs  Lab 09/12/19 1605  NA 126*  K 3.6  CL 88*  CO2 26  GLUCOSE 96  BUN 7*  CREATININE 0.49  CALCIUM 9.5   GFR: Estimated Creatinine Clearance: 55 mL/min (by C-G formula based on SCr of 0.49 mg/dL). Liver Function Tests: Recent Labs  Lab 09/12/19 1605  AST 65*  ALT 21  ALKPHOS 132*  BILITOT 1.9*  PROT 7.6  ALBUMIN 3.8   Recent Labs  Lab 09/12/19 1605  LIPASE 40   No results for input(s): AMMONIA in the last 168 hours. Coagulation Profile: No results for input(s): INR, PROTIME in the last 168 hours. Cardiac Enzymes: No results for input(s): CKTOTAL, CKMB, CKMBINDEX, TROPONINI in the last 168 hours. BNP (last 3 results) No results for input(s): PROBNP in the last 8760 hours. HbA1C: No results for input(s): HGBA1C in the last 72 hours. CBG: No results for input(s): GLUCAP in the last 168 hours. Lipid Profile: No results for input(s): CHOL, HDL, LDLCALC, TRIG, CHOLHDL, LDLDIRECT in the last 72 hours. Thyroid Function Tests: No results for input(s): TSH, T4TOTAL, FREET4, T3FREE, THYROIDAB in the last 72 hours. Anemia Panel: No results for input(s): VITAMINB12, FOLATE, FERRITIN, TIBC, IRON, RETICCTPCT in the last 72 hours. Urine analysis:    Component Value Date/Time   COLORURINE YELLOW 09/12/2019 1520   APPEARANCEUR CLOUDY (A) 09/12/2019 1520   LABSPEC 1.010 09/12/2019 1520   PHURINE 7.0 09/12/2019 1520   GLUCOSEU NEGATIVE 09/12/2019 1520   HGBUR NEGATIVE 09/12/2019 1520   BILIRUBINUR NEGATIVE 09/12/2019 1520   KETONESUR NEGATIVE 09/12/2019 1520   PROTEINUR NEGATIVE 09/12/2019  1520   UROBILINOGEN 0.2 11/26/2014 2053   NITRITE NEGATIVE 09/12/2019 1520   LEUKOCYTESUR NEGATIVE 09/12/2019 1520     Imaging Results:      Radiological Exams on Admission: DG Ribs Unilateral W/Chest Left  Result Date: 09/12/2019 CLINICAL DATA:  Left axillary rib pain, fell 2 weeks ago EXAM: LEFT RIBS AND CHEST - 3+ VIEW COMPARISON:  01/05/2019 FINDINGS: Frontal and oblique views of the left thoracic cage are obtained. There are minimally displaced left lateral ninth and tenth rib fractures. Small left pleural effusion likely reflects hemothorax. No pneumothorax. Consolidation left lung  base likely atelectasis. Cardiac silhouette is unremarkable. IMPRESSION: 1. Displaced left lateral ninth and tenth rib fractures, with small left pleural effusion likely reflecting hemothorax. 2. No evidence of pneumothorax. Electronically Signed   By: Sharlet Salina M.D.   On: 09/12/2019 16:48   CT Chest W Contrast  Result Date: 09/12/2019 CLINICAL DATA:  Abdominal pain. Pain status post fall 2 weeks ago. Left-sided rib cage pain EXAM: CT CHEST, ABDOMEN, AND PELVIS WITH CONTRAST TECHNIQUE: Multidetector CT imaging of the chest, abdomen and pelvis was performed following the standard protocol during bolus administration of intravenous contrast. CONTRAST:  33mL OMNIPAQUE IOHEXOL 300 MG/ML  SOLN COMPARISON:  CT chest dated 04/13/2013. FINDINGS: CT CHEST FINDINGS Cardiovascular: The heart size is normal. The ascending thoracic aorta is ectatic measuring approximately 3.9 cm in diameter. There is no evidence for dissection. The arch vessels are grossly patent where visualized. The main pulmonary artery is within normal limits. There is no large centrally located pulmonary embolism. Coronary artery calcifications are noted. There are minimal calcifications of the thoracic aorta. Mediastinum/Nodes: --No mediastinal or hilar lymphadenopathy. --No axillary lymphadenopathy. --No supraclavicular lymphadenopathy. --multiple  thyroid nodules are noted. The largest thyroid nodule measures up to approximately 1 cm (axial series 2, image 13). No followup recommended (ref: J Am Coll Radiol. 2015 Feb;12(2): 143-50). --The esophagus is unremarkable Lungs/Pleura: There is a large left-sided pleural effusion. The pleural fluid measures approximately 28-30 Hounsfield units consistent with a probable left-sided hemothorax. There is partial collapse of the left lower lobe. There is no pneumothorax. Mild emphysematous changes are noted. There is scattered bilateral micro nodules that appears centrally stable since the patient's prior study in 2014 and as such no further follow-up is required. Musculoskeletal: There are multiple acute to subacute displaced and nondisplaced left-sided rib fractures. The rib fractures involve the anterior sixth rib on the patient's left in addition to the posterior eighth through twelfth ribs. There are segmental fractures involving the eighth through eleventh ribs. There is a chronic appearing fracture of the superior sternal body. CT ABDOMEN PELVIS FINDINGS Hepatobiliary: There is severe hepatic steatosis. Cholelithiasis without acute inflammation.There is no biliary ductal dilation. Pancreas: There are multiple calcifications throughout the patient's pancreas consistent with chronic pancreatitis. Spleen: No splenic laceration or hematoma. Adrenals/Urinary Tract: --Adrenal glands: No adrenal hemorrhage. --Right kidney/ureter: No hydronephrosis or perinephric hematoma. --Left kidney/ureter: No hydronephrosis or perinephric hematoma. --Urinary bladder: Unremarkable. Stomach/Bowel: --Stomach/Duodenum: No hiatal hernia or other gastric abnormality. Normal duodenal course and caliber. --Small bowel: No dilatation or inflammation. --Colon: No focal abnormality. --Appendix: Normal. Vascular/Lymphatic: Atherosclerotic calcification is present within the non-aneurysmal abdominal aorta, without hemodynamically significant  stenosis. --No retroperitoneal lymphadenopathy. --No mesenteric lymphadenopathy. --No pelvic or inguinal lymphadenopathy. Reproductive: Status post hysterectomy. No adnexal mass. Other: No ascites or free air. There is a fat containing umbilical hernia. Musculoskeletal. There are age-indeterminate compression fractures of the L3 and L4 vertebral bodies with approximately 20% height loss at the L3 level and 25% height loss at the L4 level. Both of these fractures are favored to be acute to subacute. There are 5 non rib-bearing lumbar type vertebral bodies. There is grade 1 anterolisthesis of L4 on L5. IMPRESSION: 1. Large left-sided pleural effusion with partial collapse of the left lower lobe. There is increased density involving this left-sided pleural effusion suggesting that may in fact represent a left-sided hemothorax related to the patient's left-sided rib fractures. 2. Multiple acute to subacute displaced and nondisplaced left-sided rib fractures as detailed above. There are segmental fractures of the  eighth through eleventh ribs on the left. 3. Age-indeterminate compression fractures of the L3 and L4 vertebral bodies. These are favored to be acute to subacute but should be correlated with point tenderness. 4. Hepatic steatosis. 5. Cholelithiasis without acute inflammation. 6. Chronic pancreatitis. Aortic Atherosclerosis (ICD10-I70.0). Electronically Signed   By: Constance Holster M.D.   On: 09/12/2019 20:23   CT Abdomen Pelvis W Contrast  Result Date: 09/12/2019 CLINICAL DATA:  Abdominal pain. Pain status post fall 2 weeks ago. Left-sided rib cage pain EXAM: CT CHEST, ABDOMEN, AND PELVIS WITH CONTRAST TECHNIQUE: Multidetector CT imaging of the chest, abdomen and pelvis was performed following the standard protocol during bolus administration of intravenous contrast. CONTRAST:  68mL OMNIPAQUE IOHEXOL 300 MG/ML  SOLN COMPARISON:  CT chest dated 04/13/2013. FINDINGS: CT CHEST FINDINGS Cardiovascular: The  heart size is normal. The ascending thoracic aorta is ectatic measuring approximately 3.9 cm in diameter. There is no evidence for dissection. The arch vessels are grossly patent where visualized. The main pulmonary artery is within normal limits. There is no large centrally located pulmonary embolism. Coronary artery calcifications are noted. There are minimal calcifications of the thoracic aorta. Mediastinum/Nodes: --No mediastinal or hilar lymphadenopathy. --No axillary lymphadenopathy. --No supraclavicular lymphadenopathy. --multiple thyroid nodules are noted. The largest thyroid nodule measures up to approximately 1 cm (axial series 2, image 13). No followup recommended (ref: J Am Coll Radiol. 2015 Feb;12(2): 143-50). --The esophagus is unremarkable Lungs/Pleura: There is a large left-sided pleural effusion. The pleural fluid measures approximately 28-30 Hounsfield units consistent with a probable left-sided hemothorax. There is partial collapse of the left lower lobe. There is no pneumothorax. Mild emphysematous changes are noted. There is scattered bilateral micro nodules that appears centrally stable since the patient's prior study in 2014 and as such no further follow-up is required. Musculoskeletal: There are multiple acute to subacute displaced and nondisplaced left-sided rib fractures. The rib fractures involve the anterior sixth rib on the patient's left in addition to the posterior eighth through twelfth ribs. There are segmental fractures involving the eighth through eleventh ribs. There is a chronic appearing fracture of the superior sternal body. CT ABDOMEN PELVIS FINDINGS Hepatobiliary: There is severe hepatic steatosis. Cholelithiasis without acute inflammation.There is no biliary ductal dilation. Pancreas: There are multiple calcifications throughout the patient's pancreas consistent with chronic pancreatitis. Spleen: No splenic laceration or hematoma. Adrenals/Urinary Tract: --Adrenal glands: No  adrenal hemorrhage. --Right kidney/ureter: No hydronephrosis or perinephric hematoma. --Left kidney/ureter: No hydronephrosis or perinephric hematoma. --Urinary bladder: Unremarkable. Stomach/Bowel: --Stomach/Duodenum: No hiatal hernia or other gastric abnormality. Normal duodenal course and caliber. --Small bowel: No dilatation or inflammation. --Colon: No focal abnormality. --Appendix: Normal. Vascular/Lymphatic: Atherosclerotic calcification is present within the non-aneurysmal abdominal aorta, without hemodynamically significant stenosis. --No retroperitoneal lymphadenopathy. --No mesenteric lymphadenopathy. --No pelvic or inguinal lymphadenopathy. Reproductive: Status post hysterectomy. No adnexal mass. Other: No ascites or free air. There is a fat containing umbilical hernia. Musculoskeletal. There are age-indeterminate compression fractures of the L3 and L4 vertebral bodies with approximately 20% height loss at the L3 level and 25% height loss at the L4 level. Both of these fractures are favored to be acute to subacute. There are 5 non rib-bearing lumbar type vertebral bodies. There is grade 1 anterolisthesis of L4 on L5. IMPRESSION: 1. Large left-sided pleural effusion with partial collapse of the left lower lobe. There is increased density involving this left-sided pleural effusion suggesting that may in fact represent a left-sided hemothorax related to the patient's left-sided rib fractures. 2. Multiple  acute to subacute displaced and nondisplaced left-sided rib fractures as detailed above. There are segmental fractures of the eighth through eleventh ribs on the left. 3. Age-indeterminate compression fractures of the L3 and L4 vertebral bodies. These are favored to be acute to subacute but should be correlated with point tenderness. 4. Hepatic steatosis. 5. Cholelithiasis without acute inflammation. 6. Chronic pancreatitis. Aortic Atherosclerosis (ICD10-I70.0). Electronically Signed   By: Katherine Mantle M.D.   On: 09/12/2019 20:23   DG Chest Portable 1 View  Result Date: 09/12/2019 CLINICAL DATA:  Status post chest tube placement EXAM: PORTABLE CHEST 1 VIEW COMPARISON:  Films from earlier in the same day. FINDINGS: Cardiac shadow is stable in appearance. Left-sided pleural effusion is again identified. Left chest tube is now noted in place. No pneumothorax is seen. Right lung remains clear. Cardiac shadow is stable. Aortic calcifications are noted. Previously seen left rib fractures are again noted. IMPRESSION: Interval chest tube placement. Persistent effusion is noted on the left. Left rib fractures are seen without pneumothorax. Electronically Signed   By: Alcide Clever M.D.   On: 09/12/2019 23:05      [facility] Cyndie Chime MD [facility]  If 7PM-7AM, please contact night-coverage   09/13/2019, 12:37 AM

## 2019-09-13 NOTE — Progress Notes (Signed)
Report given to Okey Regal, Charity fundraiser. Patient to OR at this time. Consents signed. CHG bath done. BG 79. Bactroban placed in nostrils. PCR swab done and sent to the lab. EKG done. SCD's applied.

## 2019-09-13 NOTE — Anesthesia Procedure Notes (Signed)
Procedure Name: Intubation Date/Time: 09/13/2019 9:41 AM Performed by: Janace Litten, CRNA Pre-anesthesia Checklist: Patient identified, Emergency Drugs available, Suction available and Patient being monitored Patient Re-evaluated:Patient Re-evaluated prior to induction Oxygen Delivery Method: Circle system utilized Preoxygenation: Pre-oxygenation with 100% oxygen Induction Type: IV induction Ventilation: Mask ventilation without difficulty and Oral airway inserted - appropriate to patient size Laryngoscope Size: Mac and 3 Grade View: Grade I Tube type: Oral Endobronchial tube: Left and Double lumen EBT and 37 Fr Number of attempts: 1 Airway Equipment and Method: Stylet and Oral airway Placement Confirmation: ETT inserted through vocal cords under direct vision,  positive ETCO2 and breath sounds checked- equal and bilateral Secured at: 31 cm Tube secured with: Tape Dental Injury: Teeth and Oropharynx as per pre-operative assessment

## 2019-09-13 NOTE — Anesthesia Postprocedure Evaluation (Signed)
Anesthesia Post Note  Patient: Gloria Santana  Procedure(s) Performed: VIDEO ASSISTED THORACOSCOPY (VATS) EVACUATION of HEMOTHORAX WITH INTERCOSTAL NERVE BLOCK (Left Chest) Video Bronchoscopy (N/A Chest)     Patient location during evaluation: PACU Anesthesia Type: General Level of consciousness: awake and alert Pain management: pain level controlled Vital Signs Assessment: post-procedure vital signs reviewed and stable Respiratory status: spontaneous breathing, nonlabored ventilation, respiratory function stable and patient connected to nasal cannula oxygen Cardiovascular status: blood pressure returned to baseline and stable Postop Assessment: no apparent nausea or vomiting Anesthetic complications: no    Last Vitals:  Vitals:   09/13/19 1230 09/13/19 1300  BP: 127/82 125/83  Pulse: 92 95  Resp: 11 12  Temp:  36.6 C  SpO2: 92% 94%    Last Pain:  Vitals:   09/13/19 1300  TempSrc:   PainSc: 0-No pain                 Lettie Czarnecki COKER

## 2019-09-13 NOTE — Transfer of Care (Signed)
Immediate Anesthesia Transfer of Care Note  Patient: TEHILLA COFFEL  Procedure(s) Performed: VIDEO ASSISTED THORACOSCOPY (VATS) EVACUATION of HEMOTHORAX WITH INTERCOSTAL NERVE BLOCK (Left Chest) Video Bronchoscopy (N/A Chest)  Patient Location: PACU  Anesthesia Type:General  Level of Consciousness: awake, alert  and patient cooperative  Airway & Oxygen Therapy: Patient Spontanous Breathing and Patient connected to face mask oxygen  Post-op Assessment: Report given to RN and Post -op Vital signs reviewed and stable  Post vital signs: Reviewed and stable  Last Vitals:  Vitals Value Taken Time  BP 150/94 09/13/19 1112  Temp    Pulse 102 09/13/19 1115  Resp 21 09/13/19 1115  SpO2 100 % 09/13/19 1115  Vitals shown include unvalidated device data.  Last Pain:  Vitals:   09/13/19 0800  TempSrc:   PainSc: 0-No pain      Patients Stated Pain Goal: 3 (09/13/19 0827)  Complications: No apparent anesthesia complications

## 2019-09-13 NOTE — Progress Notes (Addendum)
Paged MD Craige Cotta [id number] Heath Lark, Adm from [facility], Can you clarify chest tube setting order please. Thanks!  >>> Per MD Craige Cotta, leave CTube on intermittent low suction at 20

## 2019-09-13 NOTE — Anesthesia Procedure Notes (Signed)
Arterial Line Insertion Start/End3/31/2021 9:30 AM Performed by: Achille Rich, MD, anesthesiologist  Preanesthetic checklist: patient identified, IV checked, risks and benefits discussed, surgical consent, monitors and equipment checked, pre-op evaluation and anesthesia consent Lidocaine 1% used for infiltration Right, radial was placed Catheter size: 20 G Hand hygiene performed  and maximum sterile barriers used  Allen's test indicative of satisfactory collateral circulation Attempts: 3 Procedure performed using ultrasound guided technique. Ultrasound Notes:anatomy identified and no ultrasound evidence of intravascular and/or intraneural injection Following insertion, Biopatch and dressing applied. Post procedure assessment: normal  Patient tolerated the procedure well with no immediate complications. Additional procedure comments: Attempt x1 by SRNA, attempt x1 by CRNA. Attempt x3 by MDA.Marland Kitchen

## 2019-09-13 NOTE — Progress Notes (Signed)
Report received from [facility] at 0215am. Patient brought by carelink around 0230am. A&O x4, VSS, RA, BG 96, pain 9/10, morphine 1mg  adm. Initial Chest tube set at 20 intermi. Awaiting provider's chest tube order clarification.

## 2019-09-13 NOTE — Plan of Care (Signed)
Continue to monitor

## 2019-09-13 NOTE — Plan of Care (Signed)

## 2019-09-13 NOTE — Anesthesia Preprocedure Evaluation (Signed)
Anesthesia Evaluation  Patient identified by MRN, date of birth, ID band Patient awake    Reviewed: Allergy & Precautions, H&P , NPO status , Patient's Chart, lab work & pertinent test results  Airway Mallampati: II   Neck ROM: full    Dental   Pulmonary COPD, Current Smoker,  empyema   breath sounds clear to auscultation       Cardiovascular hypertension,  Rhythm:regular Rate:Normal     Neuro/Psych    GI/Hepatic   Endo/Other  diabetes  Renal/GU      Musculoskeletal   Abdominal   Peds  Hematology  (+) Blood dyscrasia, anemia ,   Anesthesia Other Findings   Reproductive/Obstetrics                             Anesthesia Physical Anesthesia Plan  ASA: III  Anesthesia Plan: General   Post-op Pain Management:    Induction: Intravenous  PONV Risk Score and Plan: 2 and Ondansetron, Dexamethasone and Treatment may vary due to age or medical condition  Airway Management Planned: Oral ETT and Double Lumen EBT  Additional Equipment: Arterial line  Intra-op Plan:   Post-operative Plan: Extubation in OR  Informed Consent: I have reviewed the patients History and Physical, chart, labs and discussed the procedure including the risks, benefits and alternatives for the proposed anesthesia with the patient or authorized representative who has indicated his/her understanding and acceptance.       Plan Discussed with: CRNA, Anesthesiologist and Surgeon  Anesthesia Plan Comments:         Anesthesia Quick Evaluation

## 2019-09-13 NOTE — Progress Notes (Signed)
PROGRESS NOTE    Gloria Santana  GEZ:[id number] DOB: [birth year] DOA: 09/12/2019 PCP: Rosita Fire, MD  Brief Narrative:  HPI: Gloria Santana is a 64 y.o. female with medical history significant of COPD, diabetes mellitus type 2, alcohol dependence who presented to the ER for evaluation of possible GI bleeding. Patient had apparently been having some dark-colored stool for the past 3 days as well as some blood in the toilet paper which he attributes to hemorrhoids.  Has had some mild associated nausea. Has a history of alcohol abuse and drinks at least 2-4 beers per day.  Does report some withdrawal symptoms when she stops drinking.  On inquiry, she does admit to having a mechanical fall 2 to 3 weeks ago when she fell between the commode in the bathtub and sustained injury to the left chest wall.  She has been having left chest wall pain since the fall.  Did not have loss of consciousness or head injury at the time.  09/13/19: VATS done this AM  Assessment & Plan:   Principal Problem:   Hemothorax Active Problems:   Rib fracture   COPD (chronic obstructive pulmonary disease) (HCC)   Hemothorax, left  Left-sided traumatic hemothorax with multiple rib fractures -VATS done with 1L bloody drainage removed 09/13/19 -Pain meds as needed -Defer antiplatelets, anticoagulation -Cardiothoracic, trauma surgery consultation -We will keep n.p.o., surgery to advance to carb mod as tolerated post-operatively  Diabetes mellitus type 2 Patient is not on medications at home. -Sliding scale insulin coverage fingerstick monitoring  COPD Not in acute exacerbation -DuoNebs as needed  Nicotine dependence -We will place on nicotine patch  History of alcohol abuse Drinks several beers a day.  Does have occasional alcohol withdrawal symptoms.  Last drink was a.m. of 09/12/2019.  Currently no withdrawal symptoms. -Multivitamin, thiamine, folic acid -CIWA protocol with Ativan as  needed  Probable gastroesophageal reflux disease -Patient has been having nausea -We will place on IV PPI  Chronic normocytic anemia -Patient has hemoglobin of 11.4, has been having some mild bleeding hemorrhoids.  No other evidence of GI bleed.  Fecal occult blood is negative. -We will send anemia work-up  Mild hyponatremia -Sodium 126.  Likely secondary to chronic alcohol use. -We will give gentle IV fluid resuscitation and monitor serum sodium  DVT prophylaxis: SCDs Code Status: Full Family Communication: patient only Disposition Plan:  . Patient came from:home            . Anticipated d/c place:home . Barriers to d/c OR conditions which need to be met to effect a safe d/c:needs to recover safely from VATs procedure and monitored on CIWA for withdrawal in meantime   Consultants:   CT surgery  Procedures:   VATS Dr. Servando Snare 09/13/19  Antimicrobials:   none  Subjective: Hurting some in the chest area. Denies other concerns. No abdominal pain, nausea, vomiting. No diarrhea or constipation recently. There was some concern about dark stools on admit note and she is not sure about that.   Objective: Vitals:   09/13/19 1115 09/13/19 1130 09/13/19 1145 09/13/19 1200  BP: (!) 150/94 (!) 148/92 (!) 153/97 136/89  Pulse: (!) 102 (!) 107 (!) 107 92  Resp: (!) 21 (!) 29 (!) 23 13  Temp: (!) 97.3 F (36.3 C)     TempSrc:      SpO2: 100% 96% 100% 95%  Weight:      Height:        Intake/Output Summary (Last 24 hours) at 09/13/2019  1215 Last data filed at 09/13/2019 1200 Gross per 24 hour  Intake 1688.95 ml  Output 1505 ml  Net 183.95 ml   Filed Weights   09/12/19 1455 09/13/19 0233 09/13/19 0827  Weight: 49 kg 50.9 kg 50.9 kg    Examination:  General exam: Appears calm and comfortable  Respiratory system: Clear to auscultation. Respiratory effort poor, chest tube in place not draining Cardiovascular system: S1 & S2 heard, RRR. No JVD, murmurs, rubs, gallops or  clicks. No pedal edema. Gastrointestinal system: Abdomen is nondistended, soft and nontender. No organomegaly or masses felt. Normal bowel sounds heard. Central nervous system: Alert and oriented. No focal neurological deficits. Extremities: Symmetric 5 x 5 power. Skin: No rashes, lesions or ulcers Psychiatry: Judgement and insight appear normal. Mood & affect appropriate.   Data Reviewed: I have personally reviewed following labs and imaging studies  CBC: Recent Labs  Lab 09/12/19 1605 09/13/19 1020  WBC 4.4  --   NEUTROABS 2.7  --   HGB 11.4* 10.2*  HCT 34.1* 30.0*  MCV 90.5  --   PLT 116*  --    Basic Metabolic Panel: Recent Labs  Lab 09/12/19 1605 09/13/19 1020  NA 126* 131*  K 3.6 2.8*  CL 88*  --   CO2 26  --   GLUCOSE 96  --   BUN 7*  --   CREATININE 0.49  --   CALCIUM 9.5  --    GFR: Estimated Creatinine Clearance: 57.1 mL/min (by C-G formula based on SCr of 0.49 mg/dL). Liver Function Tests: Recent Labs  Lab 09/12/19 1605  AST 65*  ALT 21  ALKPHOS 132*  BILITOT 1.9*  PROT 7.6  ALBUMIN 3.8   Recent Labs  Lab 09/12/19 1605  LIPASE 40   No results for input(s): AMMONIA in the last 168 hours. Coagulation Profile: No results for input(s): INR, PROTIME in the last 168 hours. Cardiac Enzymes: No results for input(s): CKTOTAL, CKMB, CKMBINDEX, TROPONINI in the last 168 hours. BNP (last 3 results) No results for input(s): PROBNP in the last 8760 hours. HbA1C: Recent Labs    09/12/19 1605  HGBA1C 4.5*   CBG: Recent Labs  Lab 09/13/19 0244 09/13/19 0759 09/13/19 1111  GLUCAP 96 79 102*   Lipid Profile: No results for input(s): CHOL, HDL, LDLCALC, TRIG, CHOLHDL, LDLDIRECT in the last 72 hours. Thyroid Function Tests: No results for input(s): TSH, T4TOTAL, FREET4, T3FREE, THYROIDAB in the last 72 hours. Anemia Panel: No results for input(s): VITAMINB12, FOLATE, FERRITIN, TIBC, IRON, RETICCTPCT in the last 72 hours. Sepsis Labs: No results  for input(s): PROCALCITON, LATICACIDVEN in the last 168 hours.  Recent Results (from the past 240 hour(s))  Respiratory Panel by RT PCR (Flu A&B, Covid) - Nasopharyngeal Swab     Status: None   Collection Time: 09/12/19 11:39 PM   Specimen: Nasopharyngeal Swab  Result Value Ref Range Status   SARS Coronavirus 2 by RT PCR NEGATIVE NEGATIVE Final    Comment: (NOTE) SARS-CoV-2 target nucleic acids are NOT DETECTED. The SARS-CoV-2 RNA is generally detectable in upper respiratoy specimens during the acute phase of infection. The lowest concentration of SARS-CoV-2 viral copies this assay can detect is 131 copies/mL. A negative result does not preclude SARS-Cov-2 infection and should not be used as the sole basis for treatment or other patient management decisions. A negative result may occur with  improper specimen collection/handling, submission of specimen other than nasopharyngeal swab, presence of viral mutation(s) within the areas targeted by  this assay, and inadequate number of viral copies (<131 copies/mL). A negative result must be combined with clinical observations, patient history, and epidemiological information. The expected result is Negative. Fact Sheet for Patients:  PinkCheek.be Fact Sheet for Healthcare Providers:  GravelBags.it This test is not yet ap proved or cleared by the [place] FDA and  has been authorized for detection and/or diagnosis of SARS-CoV-2 by FDA under an Emergency Use Authorization (EUA). This EUA will remain  in effect (meaning this test can be used) for the duration of the COVID-19 declaration under Section 564(b)(1) of the Act, 21 U.S.C. section 360bbb-3(b)(1), unless the authorization is terminated or revoked sooner.    Influenza A by PCR NEGATIVE NEGATIVE Final   Influenza B by PCR NEGATIVE NEGATIVE Final    Comment: (NOTE) The Xpert Xpress SARS-CoV-2/FLU/RSV assay is intended as an  aid in  the diagnosis of influenza from Nasopharyngeal swab specimens and  should not be used as a sole basis for treatment. Nasal washings and  aspirates are unacceptable for Xpert Xpress SARS-CoV-2/FLU/RSV  testing. Fact Sheet for Patients: PinkCheek.be Fact Sheet for Healthcare Providers: GravelBags.it This test is not yet approved or cleared by the [place] FDA and  has been authorized for detection and/or diagnosis of SARS-CoV-2 by  FDA under an Emergency Use Authorization (EUA). This EUA will remain  in effect (meaning this test can be used) for the duration of the  Covid-19 declaration under Section 564(b)(1) of the Act, 21  U.S.C. section 360bbb-3(b)(1), unless the authorization is  terminated or revoked. Performed at [facility], [address] [place], [place] [address]   MRSA PCR Screening     Status: None   Collection Time: 09/13/19  7:57 AM   Specimen: Nasal Mucosa; Nasopharyngeal  Result Value Ref Range Status   MRSA by PCR NEGATIVE NEGATIVE Final    Comment:        The GeneXpert MRSA Assay (FDA approved for NASAL specimens only), is one component of a comprehensive MRSA colonization surveillance program. It is not intended to diagnose MRSA infection nor to guide or monitor treatment for MRSA infections. Performed at [facility], [place] [address], [place], [place] [address]     Radiology Studies: DG Ribs Unilateral W/Chest Left  Result Date: 09/12/2019 CLINICAL DATA:  Left axillary rib pain, fell 2 weeks ago EXAM: LEFT RIBS AND CHEST - 3+ VIEW COMPARISON:  01/05/2019 FINDINGS: Frontal and oblique views of the left thoracic cage are obtained. There are minimally displaced left lateral ninth and tenth rib fractures. Small left pleural effusion likely reflects hemothorax. No pneumothorax. Consolidation left lung base likely atelectasis. Cardiac silhouette is unremarkable. IMPRESSION: 1. Displaced  left lateral ninth and tenth rib fractures, with small left pleural effusion likely reflecting hemothorax. 2. No evidence of pneumothorax. Electronically Signed   By: Randa Ngo M.D.   On: 09/12/2019 16:48   CT Chest W Contrast  Result Date: 09/12/2019 CLINICAL DATA:  Abdominal pain. Pain status post fall 2 weeks ago. Left-sided rib cage pain EXAM: CT CHEST, ABDOMEN, AND PELVIS WITH CONTRAST TECHNIQUE: Multidetector CT imaging of the chest, abdomen and pelvis was performed following the standard protocol during bolus administration of intravenous contrast. CONTRAST:  52m OMNIPAQUE IOHEXOL 300 MG/ML  SOLN COMPARISON:  CT chest dated 04/13/2013. FINDINGS: CT CHEST FINDINGS Cardiovascular: The heart size is normal. The ascending thoracic aorta is ectatic measuring approximately 3.9 cm in diameter. There is no evidence for dissection. The arch vessels are grossly patent  where visualized. The main pulmonary artery is within normal limits. There is no large centrally located pulmonary embolism. Coronary artery calcifications are noted. There are minimal calcifications of the thoracic aorta. Mediastinum/Nodes: --No mediastinal or hilar lymphadenopathy. --No axillary lymphadenopathy. --No supraclavicular lymphadenopathy. --multiple thyroid nodules are noted. The largest thyroid nodule measures up to approximately 1 cm (axial series 2, image 13). No followup recommended (ref: J Am Coll Radiol. 2015 Feb;12(2): 143-50). --The esophagus is unremarkable Lungs/Pleura: There is a large left-sided pleural effusion. The pleural fluid measures approximately 28-30 Hounsfield units consistent with a probable left-sided hemothorax. There is partial collapse of the left lower lobe. There is no pneumothorax. Mild emphysematous changes are noted. There is scattered bilateral micro nodules that appears centrally stable since the patient's prior study in 2014 and as such no further follow-up is required. Musculoskeletal: There are  multiple acute to subacute displaced and nondisplaced left-sided rib fractures. The rib fractures involve the anterior sixth rib on the patient's left in addition to the posterior eighth through twelfth ribs. There are segmental fractures involving the eighth through eleventh ribs. There is a chronic appearing fracture of the superior sternal body. CT ABDOMEN PELVIS FINDINGS Hepatobiliary: There is severe hepatic steatosis. Cholelithiasis without acute inflammation.There is no biliary ductal dilation. Pancreas: There are multiple calcifications throughout the patient's pancreas consistent with chronic pancreatitis. Spleen: No splenic laceration or hematoma. Adrenals/Urinary Tract: --Adrenal glands: No adrenal hemorrhage. --Right kidney/ureter: No hydronephrosis or perinephric hematoma. --Left kidney/ureter: No hydronephrosis or perinephric hematoma. --Urinary bladder: Unremarkable. Stomach/Bowel: --Stomach/Duodenum: No hiatal hernia or other gastric abnormality. Normal duodenal course and caliber. --Small bowel: No dilatation or inflammation. --Colon: No focal abnormality. --Appendix: Normal. Vascular/Lymphatic: Atherosclerotic calcification is present within the non-aneurysmal abdominal aorta, without hemodynamically significant stenosis. --No retroperitoneal lymphadenopathy. --No mesenteric lymphadenopathy. --No pelvic or inguinal lymphadenopathy. Reproductive: Status post hysterectomy. No adnexal mass. Other: No ascites or free air. There is a fat containing umbilical hernia. Musculoskeletal. There are age-indeterminate compression fractures of the L3 and L4 vertebral bodies with approximately 20% height loss at the L3 level and 25% height loss at the L4 level. Both of these fractures are favored to be acute to subacute. There are 5 non rib-bearing lumbar type vertebral bodies. There is grade 1 anterolisthesis of L4 on L5. IMPRESSION: 1. Large left-sided pleural effusion with partial collapse of the left lower  lobe. There is increased density involving this left-sided pleural effusion suggesting that may in fact represent a left-sided hemothorax related to the patient's left-sided rib fractures. 2. Multiple acute to subacute displaced and nondisplaced left-sided rib fractures as detailed above. There are segmental fractures of the eighth through eleventh ribs on the left. 3. Age-indeterminate compression fractures of the L3 and L4 vertebral bodies. These are favored to be acute to subacute but should be correlated with point tenderness. 4. Hepatic steatosis. 5. Cholelithiasis without acute inflammation. 6. Chronic pancreatitis. Aortic Atherosclerosis (ICD10-I70.0). Electronically Signed   By: Constance Holster M.D.   On: 09/12/2019 20:23   CT Abdomen Pelvis W Contrast  Result Date: 09/12/2019 CLINICAL DATA:  Abdominal pain. Pain status post fall 2 weeks ago. Left-sided rib cage pain EXAM: CT CHEST, ABDOMEN, AND PELVIS WITH CONTRAST TECHNIQUE: Multidetector CT imaging of the chest, abdomen and pelvis was performed following the standard protocol during bolus administration of intravenous contrast. CONTRAST:  4m OMNIPAQUE IOHEXOL 300 MG/ML  SOLN COMPARISON:  CT chest dated 04/13/2013. FINDINGS: CT CHEST FINDINGS Cardiovascular: The heart size is normal. The ascending thoracic aorta is ectatic  measuring approximately 3.9 cm in diameter. There is no evidence for dissection. The arch vessels are grossly patent where visualized. The main pulmonary artery is within normal limits. There is no large centrally located pulmonary embolism. Coronary artery calcifications are noted. There are minimal calcifications of the thoracic aorta. Mediastinum/Nodes: --No mediastinal or hilar lymphadenopathy. --No axillary lymphadenopathy. --No supraclavicular lymphadenopathy. --multiple thyroid nodules are noted. The largest thyroid nodule measures up to approximately 1 cm (axial series 2, image 13). No followup recommended (ref: J Am  Coll Radiol. 2015 Feb;12(2): 143-50). --The esophagus is unremarkable Lungs/Pleura: There is a large left-sided pleural effusion. The pleural fluid measures approximately 28-30 Hounsfield units consistent with a probable left-sided hemothorax. There is partial collapse of the left lower lobe. There is no pneumothorax. Mild emphysematous changes are noted. There is scattered bilateral micro nodules that appears centrally stable since the patient's prior study in 2014 and as such no further follow-up is required. Musculoskeletal: There are multiple acute to subacute displaced and nondisplaced left-sided rib fractures. The rib fractures involve the anterior sixth rib on the patient's left in addition to the posterior eighth through twelfth ribs. There are segmental fractures involving the eighth through eleventh ribs. There is a chronic appearing fracture of the superior sternal body. CT ABDOMEN PELVIS FINDINGS Hepatobiliary: There is severe hepatic steatosis. Cholelithiasis without acute inflammation.There is no biliary ductal dilation. Pancreas: There are multiple calcifications throughout the patient's pancreas consistent with chronic pancreatitis. Spleen: No splenic laceration or hematoma. Adrenals/Urinary Tract: --Adrenal glands: No adrenal hemorrhage. --Right kidney/ureter: No hydronephrosis or perinephric hematoma. --Left kidney/ureter: No hydronephrosis or perinephric hematoma. --Urinary bladder: Unremarkable. Stomach/Bowel: --Stomach/Duodenum: No hiatal hernia or other gastric abnormality. Normal duodenal course and caliber. --Small bowel: No dilatation or inflammation. --Colon: No focal abnormality. --Appendix: Normal. Vascular/Lymphatic: Atherosclerotic calcification is present within the non-aneurysmal abdominal aorta, without hemodynamically significant stenosis. --No retroperitoneal lymphadenopathy. --No mesenteric lymphadenopathy. --No pelvic or inguinal lymphadenopathy. Reproductive: Status post  hysterectomy. No adnexal mass. Other: No ascites or free air. There is a fat containing umbilical hernia. Musculoskeletal. There are age-indeterminate compression fractures of the L3 and L4 vertebral bodies with approximately 20% height loss at the L3 level and 25% height loss at the L4 level. Both of these fractures are favored to be acute to subacute. There are 5 non rib-bearing lumbar type vertebral bodies. There is grade 1 anterolisthesis of L4 on L5. IMPRESSION: 1. Large left-sided pleural effusion with partial collapse of the left lower lobe. There is increased density involving this left-sided pleural effusion suggesting that may in fact represent a left-sided hemothorax related to the patient's left-sided rib fractures. 2. Multiple acute to subacute displaced and nondisplaced left-sided rib fractures as detailed above. There are segmental fractures of the eighth through eleventh ribs on the left. 3. Age-indeterminate compression fractures of the L3 and L4 vertebral bodies. These are favored to be acute to subacute but should be correlated with point tenderness. 4. Hepatic steatosis. 5. Cholelithiasis without acute inflammation. 6. Chronic pancreatitis. Aortic Atherosclerosis (ICD10-I70.0). Electronically Signed   By: Constance Holster M.D.   On: 09/12/2019 20:23   DG Chest Port 1 View  Result Date: 09/13/2019 CLINICAL DATA:  Postop VATS, chest tube EXAM: PORTABLE CHEST 1 VIEW COMPARISON:  09/12/2019 FINDINGS: Interval removal of left-sided pigtail chest tube and placement of large-bore chest tube, tip about the left apex. Near complete resolution of previously seen left pleural effusion. No significant pneumothorax. Heart and mediastinum are unremarkable. Left-sided rib fractures are not well appreciated. IMPRESSION:  Interval removal of left-sided pigtail chest tube and placement of large-bore chest tube, tip about the left apex. Near complete resolution of previously seen left pleural effusion. No  significant pneumothorax. Electronically Signed   By: Eddie Candle M.D.   On: 09/13/2019 11:56   DG Chest Portable 1 View  Result Date: 09/12/2019 CLINICAL DATA:  Status post chest tube placement EXAM: PORTABLE CHEST 1 VIEW COMPARISON:  Films from earlier in the same day. FINDINGS: Cardiac shadow is stable in appearance. Left-sided pleural effusion is again identified. Left chest tube is now noted in place. No pneumothorax is seen. Right lung remains clear. Cardiac shadow is stable. Aortic calcifications are noted. Previously seen left rib fractures are again noted. IMPRESSION: Interval chest tube placement. Persistent effusion is noted on the left. Left rib fractures are seen without pneumothorax. Electronically Signed   By: Inez Catalina M.D.   On: 09/12/2019 23:05   Scheduled Meds: . [MAR Hold] folic acid  1 mg Oral Daily  . [MAR Hold] insulin aspart  0-6 Units Subcutaneous Q6H  . [MAR Hold] multivitamin with minerals  1 tablet Oral Daily  . mupirocin ointment      . [MAR Hold] nicotine  14 mg Transdermal Daily  . [MAR Hold] pantoprazole (PROTONIX) IV  40 mg Intravenous QHS  . [MAR Hold] sodium chloride flush  3 mL Intravenous Q12H  . [MAR Hold] thiamine  100 mg Oral Daily   Or  . [MAR Hold] thiamine  100 mg Intravenous Daily   Continuous Infusions: . [MAR Hold] sodium chloride    . lactated ringers Stopped (09/13/19 1115)  . lactated ringers 10 mL/hr at 09/13/19 [id number]     LOS: 0 days   Hoyt Koch, MD [facility]  To contact the attending provider between 7A-7P or the covering provider during after hours 7P-7A, please log into the web site [email or web address] and access using universal [place] password for that web site. If you do not have the password, please call the hospital operator.  09/13/2019, 12:15 PM

## 2019-09-13 NOTE — Brief Op Note (Addendum)
      [address]       [name] [address]             [phone]      09/13/2019  10:49 AM  PATIENT:  Gloria Santana  64 y.o. female  PRE-OPERATIVE DIAGNOSIS:  hemothorax  POST-OPERATIVE DIAGNOSIS: hemothorax  PROCEDURE:  Procedure(s): VIDEO ASSISTED THORACOSCOPY (VATS) EVACUATION of HEMOTHORAX WITH INTERCOSTAL NERVE BLOCK (Left) Video Bronchoscopy (N/A)  SURGEON:  Surgeon(s) and Role:    * Delight Ovens, MD - Primary  PHYSICIAN ASSISTANT: WAYNE GOLD PA-C  ANESTHESIA:   general  EBL: MINIMAL  FINDINGS: 1000 CC OLD BLOOD , NO EVIDENCE OF EMPYEMAA  BLOOD ADMINISTERED:none  DRAINS: (1 28 F ) Blake drain(s) in the LEFT HEMITHORAX   LOCAL MEDICATIONS USED:  EXPAREL  SPECIMEN:  Source of Specimen:  HEMOTHORAX  DISPOSITION OF SPECIMEN:  MICRO  COUNTS:  YES   DICTATION: .Other Dictation: Dictation Number PENDING  PLAN OF CARE: Admit to inpatient   PATIENT DISPOSITION:  PACU - hemodynamically stable.   Delay start of Pharmacological VTE agent (>24hrs) due to surgical blood loss or risk of bleeding: yes  COMPLICATIONS: NO KNOWN

## 2019-09-13 NOTE — Progress Notes (Signed)
Per Ms. Chrisandra Carota, daughter, Camelia Eng updated at this time.

## 2019-09-14 ENCOUNTER — Inpatient Hospital Stay (HOSPITAL_COMMUNITY): Payer: Medicaid Other

## 2019-09-14 LAB — CBC
HCT: 28.8 % — ABNORMAL LOW (ref 36.0–46.0)
Hemoglobin: 10 g/dL — ABNORMAL LOW (ref 12.0–15.0)
MCH: 30.7 pg (ref 26.0–34.0)
MCHC: 34.7 g/dL (ref 30.0–36.0)
MCV: 88.3 fL (ref 80.0–100.0)
Platelets: 109 10*3/uL — ABNORMAL LOW (ref 150–400)
RBC: 3.26 MIL/uL — ABNORMAL LOW (ref 3.87–5.11)
RDW: 13.5 % (ref 11.5–15.5)
WBC: 6 10*3/uL (ref 4.0–10.5)
nRBC: 0 % (ref 0.0–0.2)

## 2019-09-14 LAB — COMPREHENSIVE METABOLIC PANEL
ALT: 17 U/L (ref 0–44)
AST: 45 U/L — ABNORMAL HIGH (ref 15–41)
Albumin: 2.9 g/dL — ABNORMAL LOW (ref 3.5–5.0)
Alkaline Phosphatase: 85 U/L (ref 38–126)
Anion gap: 11 (ref 5–15)
BUN: <5 mg/dL — ABNORMAL LOW (ref 8–23)
CO2: 23 mmol/L (ref 22–32)
Calcium: 8.7 mg/dL — ABNORMAL LOW (ref 8.9–10.3)
Chloride: 95 mmol/L — ABNORMAL LOW (ref 98–111)
Creatinine, Ser: 0.61 mg/dL (ref 0.44–1.00)
GFR calc Af Amer: >60 mL/min (ref >60)
GFR calc non Af Amer: >60 mL/min (ref >60)
Glucose, Bld: 83 mg/dL (ref 70–99)
Potassium: 3.6 mmol/L (ref 3.5–5.1)
Sodium: 129 mmol/L — ABNORMAL LOW (ref 135–145)
Total Bilirubin: 1.1 mg/dL (ref 0.3–1.2)
Total Protein: 5.6 g/dL — ABNORMAL LOW (ref 6.5–8.1)

## 2019-09-14 LAB — BLOOD GAS, ARTERIAL
Acid-Base Excess: 1 mmol/L (ref 0.0–2.0)
Bicarbonate: 24.4 mmol/L (ref 20.0–28.0)
FIO2: 21
O2 Saturation: 98.5 %
Patient temperature: 36.7
pCO2 arterial: 33.5 mmHg (ref 32.0–48.0)
pH, Arterial: 7.474 — ABNORMAL HIGH (ref 7.350–7.450)
pO2, Arterial: 99.2 mmHg (ref 83.0–108.0)

## 2019-09-14 LAB — ACID FAST SMEAR (AFB, MYCOBACTERIA): Acid Fast Smear: NEGATIVE

## 2019-09-14 LAB — MAGNESIUM: Magnesium: 1.3 mg/dL — ABNORMAL LOW (ref 1.7–2.4)

## 2019-09-14 LAB — GLUCOSE, CAPILLARY: Glucose-Capillary: 117 mg/dL — ABNORMAL HIGH (ref 70–99)

## 2019-09-14 MED ORDER — PANTOPRAZOLE SODIUM 20 MG PO TBEC
20.0000 mg | DELAYED_RELEASE_TABLET | Freq: Every day | ORAL | Status: DC | PRN
  Filled 2019-09-14: qty 1

## 2019-09-14 MED ORDER — MAGNESIUM SULFATE 2 GM/50ML IV SOLN
2.0000 g | Freq: Once | INTRAVENOUS | Status: AC
  Administered 2019-09-14: 07:00:00 2 g via INTRAVENOUS
  Filled 2019-09-14: qty 50

## 2019-09-14 MED ORDER — ALBUTEROL SULFATE (2.5 MG/3ML) 0.083% IN NEBU: 2.5000 mg | INHALATION_SOLUTION | RESPIRATORY_TRACT | Status: DC | PRN

## 2019-09-14 NOTE — Discharge Instructions (Signed)
Thoracoscopy, Care After This sheet gives you information about how to care for yourself after your procedure. Your health care provider may also give you more specific instructions. If you have problems or questions, contact your health care provider. What can I expect after the procedure? After the procedure, it is common to have pain and soreness in the surgical area. Follow these instructions at home: Incision care   Follow instructions from your health care provider about how to take care of your incision. Make sure you: ? Wash your hands with soap and water before you change your bandage (dressing). If soap and water are not available, use hand sanitizer. ? Change your dressing as told by your health care provider. ? Leave stitches (sutures), skin glue, or adhesive strips in place. These skin closures may need to stay in place for 2 weeks or longer. If adhesive strip edges start to loosen and curl up, you may trim the loose edges. Do not remove adhesive strips completely unless your health care provider tells you to do that.  Check your incision areas every day for signs of infection. Check for: ? Redness, swelling, or pain. ? Fluid or blood. ? Warmth. ? Pus or a bad smell.  Do not take baths, swim, or use a hot tub until your health care provider approves. You may take showers. Medicines  Take over-the-counter and prescription medicines only as told by your health care provider.  If you were prescribed an antibiotic medicine, take it as told by your health care provider. Do not stop taking the antibiotic even if you start to feel better.  Do not drive or use heavy machinery while taking prescription pain medicine.  If you are taking prescription pain medicine, take actions to prevent or treat constipation. Your health care provider may recommend that you: ? Drink enough fluid to keep your urine pale yellow. ? Eat foods that are high in fiber, such as fresh fruits and vegetables,  whole grains, and beans. ? Limit foods that are high in fat and processed sugars, such as fried and sweet foods. ? Take an over-the-counter or prescription medicine for constipation. Managing pain, stiffness, and swelling   If directed, put ice on the affected area: ? Put ice in a plastic bag. ? Place a towel between your skin and the bag. ? Leave the ice on for 20 minutes, 2-3 times a day. Preventing lung infection  To prevent pneumonia and to keep your lungs healthy: ? Try to cough often. If it hurts to cough, hold a pillow against your chest as you cough. ? Take deep breaths or do breathing exercises as instructed by your health care provider. ? If you were given an incentive spirometer, use it as directed by your health care provider. General instructions  Do not lift anything that is heavier than 10 lb (4.5 kg), or the limit that you are told, until your health care provider says that it is safe.  Do not use any products that contain nicotine or tobacco, such as cigarettes and e-cigarettes. These can delay healing after surgery. If you need help quitting, ask your health care provider.  Avoid driving until your health care provider approves.  If you have a chest drainage tube, care for it as instructed by your health care provider. Do not travel by airplane after the chest drainage tube is removed until your health care provider approves.  Keep all follow-up visits as told by your health care provider. This is important. Contact   a health care provider if:  You have a fever.  Pain medicines do not ease your pain.  You have redness, swelling, or increasing pain in your incision area.  You develop a cough that does not go away, or you are coughing up mucus that is yellow or green. Get help right away if:  You have fluid, blood, or pus coming from your incision.  There is a bad smell coming from your incision or dressing.  You develop a rash.  You cough up blood.  You  develop light-headedness, or you feel faint.  You have difficulty breathing.  You develop chest pain.  Your heartbeat feels irregular or very fast. These symptoms may represent a serious problem that is an emergency. Do not wait to see if the symptoms will go away. Get medical help right away. Call your local emergency services (911 in the [place] Do not drive yourself to the hospital. Summary  Follow instructions from your health care provider about how to take care of your incision.  Do not drive or use heavy machinery while taking prescription pain medicine.  Leave stitches (sutures), skin glue, or adhesive strips in place.  Check your incision areas every day for signs of infection. This information is not intended to replace advice given to you by your health care provider. Make sure you discuss any questions you have with your health care provider. Document Revised: 05/14/2017 Document Reviewed: 05/11/2017 Elsevier Patient Education  2020 [employer]  

## 2019-09-14 NOTE — Plan of Care (Signed)

## 2019-09-14 NOTE — Progress Notes (Addendum)
      [address]       [name] [address]             [phone]       1 Day Post-Op Procedure(s) (LRB): VIDEO ASSISTED THORACOSCOPY (VATS) EVACUATION of HEMOTHORAX WITH INTERCOSTAL NERVE BLOCK (Left) Video Bronchoscopy (N/A)  Subjective: Patient eating breakfast this am. She does not have much pain this am and state her breathing is better.  Objective: Vital signs in last 24 hours: Temp:  [97.2 F (36.2 C)-98.9 F (37.2 C)] 97.5 F (36.4 C) (04/01 0327) Pulse Rate:  [92-116] 98 (04/01 0327) Cardiac Rhythm: Normal sinus rhythm (04/01 0327) Resp:  [11-29] 12 (04/01 0327) BP: (94-153)/(67-97) 99/74 (04/01 0327) SpO2:  [92 %-100 %] 97 % (04/01 0327) Arterial Line BP: (126-183)/(55-102) 127/55 (04/01 0327) Weight:  [50.9 kg] 50.9 kg (03/31 0827)      Intake/Output from previous day: 03/31 0701 - 04/01 0700 In: 2511.9 [P.O.:720; I.V.:1021.2; IV Piggyback:720.7] Out: 2080 [Urine:750; Blood:1000; Chest Tube:330]   Physical Exam:  Cardiovascular: Slightly tachycardic Pulmonary: Clear to auscultation on the right, and slightly diminished at left base Abdomen: Soft, non tender, bowel sounds present. Extremities: No lower extremity edema. Wounds: Dressings are clean and dry.   Chest Tube: to suction, no air leak  Lab Results: CBC: Recent Labs    09/13/19 1744 09/14/19 0324  WBC 5.2 6.0  HGB 11.5* 10.0*  HCT 34.0* 28.8*  PLT 121* 109*   BMET:  Recent Labs    09/13/19 1255 09/14/19 0324  NA 132* 129*  K 3.4* 3.6  CL 98 95*  CO2 21* 23  GLUCOSE 113* 83  BUN <5* <5*  CREATININE 0.55 0.61  CALCIUM 8.7* 8.7*    PT/INR: No results for input(s): LABPROT, INR in the last 72 hours. ABG:  INR: Will add last result for INR, ABG once components are confirmed Will add last 4 CBG results once components are confirmed  Assessment/Plan:  1. CV - ST with HR 2.  Pulmonary - History of COPD. On 2 liters of oxygen via [place]. Chest tube with 330 cc  this am an no air leak. CXR ordered;await results. S/p drain hemothorax. Gram stain showed no organisms and culture is pending. Hope to place to water seal soon. Encourage incentive spirometer. 3. DM-CBGs 142/145/117. Pre op HGA1C 4.5. According to patient she does not have diabetes and takes no medication. Per primary 4. Chronic normocytic anemia-H and H this am 10 and 28.8 5. Hyponatremia-sodium this am slightly decreased to 129. 6. History of etoh abuse- On Thiamine. Per primary 7. Thrombocytopenia-platelets this am 109,000 8. Hypomagnesemia-1.3 this am and being supplemented. 9. Remove a line and foley  Gloria M ZimmermanPA-C 09/14/2019,7:25 AM [phone]  Will leave chest tube today, d/c a line and foley  Feels better today  Eating  Has severe protein malnutrition  I have seen and examined Gloria Santana and agree with the above assessment  and plan.  Delight Ovens MD [facility] [phone] Office [phone] 09/14/2019 8:21 AM

## 2019-09-14 NOTE — Progress Notes (Signed)
PROGRESS NOTE    Gloria Santana  LPF:[id number] DOB: May 18, 1956 DOA: 09/12/2019 PCP: Avon Gully, MD      Brief Narrative:  Gloria Santana is a 64 y.o. F with hx depression and alcohol use disorder, possible COPD who presented with 2 weeks chest pain after a fall.  Patient fell at home between the toilet and the wall about 2 weeks ago.  Since then she has been having progressive left-sided chest pain progressive weakness, shortness of breath and pleuritic pain, which became severe enough that she came to the emergency room.  In the ER, radiograph showed rib fractures of ribs 8-12.  CT showed a moderate to large left-sided hemothorax.  Chest tube was placed in the ER with a scant return.  Case was discussed with CT surgery, who recommended VATS procedure.           Assessment & Plan:  Hemothorax, left side due to traumatic rib fracture S/p VATS 3/31 by Dr. Tyrone Sage -Per CT surgery    Alcohol use disorder Does not appear to be in withdrawal. -Continue CIWA scoring for 24 more hours.  Possible COPD Smoking No evidence of bronchospasm.  Smoking cessation recommended. -Duo nebs as needed -Continue nicotine supplement  Diabetes ruled out -Stop CBGs  Hyponatremia Asymptomatic, euvolemic.  Related to alcohol use.  Normocytic anemia Thrombocytopenia Hemoglobin stable post VATS.  FOBT negative.  Platelets stable.      Disposition: The patient was admitted with hemothorax.  She has now undergone VATS yesterday, still has a chest tube on suction.  I will discharge when CT surgery has cleared the patient for discharge.        MDM: The below labs and imaging reports were reviewed and summarized above.  Medication management as above.    DVT prophylaxis: SCDs Code Status: FULL Family Communication:     Consultants:   CT surgery  Procedures:   3/31 VATS  Antimicrobials:   Cefazolin x1 perioperatively  Culture data:   3/31 pleural fluid  culture, no growth to date          Subjective: Patient has no headache, dyspnea, confusion.  Her chest pain is well controlled with oral agents.  Her appetite is good.  Objective: Vitals:   09/13/19 2100 09/13/19 2200 09/13/19 2345 09/14/19 0327  BP:   99/67 99/74  Pulse: (!) 110 (!) 107 (!) 104 98  Resp: 15 11 15 12   Temp:   98.9 F (37.2 C) (!) 97.5 F (36.4 C)  TempSrc:   Oral Oral  SpO2: 100% 98% 99% 97%  Weight:      Height:        Intake/Output Summary (Last 24 hours) at 09/14/2019 0815 Last data filed at 09/14/2019 0500 Gross per 24 hour  Intake 2511.88 ml  Output 2080 ml  Net 431.88 ml   Filed Weights   09/12/19 1455 09/13/19 0233 09/13/19 0827  Weight: 49 kg 50.9 kg 50.9 kg    Examination: General appearance: Thin adult female, alert and in no acute distress.  sitting up in recliner, eating breakfast HEENT: Anicteric, conjunctiva pink, lids and lashes normal. No nasal deformity, discharge, epistaxis.  Lips moist, dentures in place, no oral lesions, oropharynx moist, hearing normal.   Skin: Warm and dry.  No jaundice.  No suspicious rashes or lesions. Cardiac: RRR, nl S1-S2, no murmurs appreciated.  Capillary refill is brisk.  JVP normal.  No LE edema.  Radial pulses 2+ and symmetric. Respiratory: Normal respiratory rate and rhythm.  CTAB  without rales or wheezes.  Diminished on the left side near chest tube. Abdomen: Abdomen soft.  No TTP or guarding. No ascites, distension, hepatosplenomegaly.   MSK: No deformities or effusions.  Diffuse depletion, moderate subcutaneous muscle mass and fat. Neuro: Awake and alert.  EOMI, moves all extremities. Speech fluent.    Psych: Sensorium intact and responding to questions, attention normal. Affect normal.  Judgment and insight appear normal.    Data Reviewed: I have personally reviewed following labs and imaging studies:  CBC: Recent Labs  Lab 09/12/19 1605 09/13/19 1020 09/13/19 1744 09/14/19 0324  WBC 4.4   --  5.2 6.0  NEUTROABS 2.7  --   --   --   HGB 11.4* 10.2* 11.5* 10.0*  HCT 34.1* 30.0* 34.0* 28.8*  MCV 90.5  --  89.0 88.3  PLT 116*  --  121* 109*   Basic Metabolic Panel: Recent Labs  Lab 09/12/19 1605 09/13/19 1020 09/13/19 1255 09/14/19 0324  NA 126* 131* 132* 129*  K 3.6 2.8* 3.4* 3.6  CL 88*  --  98 95*  CO2 26  --  21* 23  GLUCOSE 96  --  113* 83  BUN 7*  --  <5* <5*  CREATININE 0.49  --  0.55 0.61  CALCIUM 9.5  --  8.7* 8.7*  MG  --   --   --  1.3*   GFR: Estimated Creatinine Clearance: 57.1 mL/min (by C-G formula based on SCr of 0.61 mg/dL). Liver Function Tests: Recent Labs  Lab 09/12/19 1605 09/13/19 1255 09/14/19 0324  AST 65* 51* 45*  ALT ALKPHOS 132* 90 85  BILITOT 1.9* 1.6* 1.1  PROT 7.6 5.9* 5.6*  ALBUMIN 3.8 3.2* 2.9*   Recent Labs  Lab 09/12/19 1605  LIPASE 40   No results for input(s): AMMONIA in the last 168 hours. Coagulation Profile: No results for input(s): INR, PROTIME in the last 168 hours. Cardiac Enzymes: No results for input(s): CKTOTAL, CKMB, CKMBINDEX, TROPONINI in the last 168 hours. BNP (last 3 results) No results for input(s): PROBNP in the last 8760 hours. HbA1C: Recent Labs    09/12/19 1605  HGBA1C 4.5*   CBG: Recent Labs  Lab 09/13/19 0759 09/13/19 1111 09/13/19 1633 09/13/19 2343 09/14/19 0545  GLUCAP 79 102* 142* 145* 117*   Lipid Profile: No results for input(s): CHOL, HDL, LDLCALC, TRIG, CHOLHDL, LDLDIRECT in the last 72 hours. Thyroid Function Tests: No results for input(s): TSH, T4TOTAL, FREET4, T3FREE, THYROIDAB in the last 72 hours. Anemia Panel: No results for input(s): VITAMINB12, FOLATE, FERRITIN, TIBC, IRON, RETICCTPCT in the last 72 hours. Urine analysis:    Component Value Date/Time   COLORURINE YELLOW 09/13/2019 1522   APPEARANCEUR CLEAR 09/13/2019 1522   LABSPEC 1.011 09/13/2019 1522   PHURINE 6.0 09/13/2019 1522   GLUCOSEU NEGATIVE 09/13/2019 1522   HGBUR SMALL (A)  09/13/2019 1522   BILIRUBINUR NEGATIVE 09/13/2019 1522   KETONESUR 5 (A) 09/13/2019 1522   PROTEINUR NEGATIVE 09/13/2019 1522   UROBILINOGEN 0.2 11/26/2014 2053   NITRITE NEGATIVE 09/13/2019 1522   LEUKOCYTESUR NEGATIVE 09/13/2019 1522   Sepsis Labs: (procalcitonin:4,lacticacidven:4)  ) Recent Results (from the past 240 hour(s))  Respiratory Panel by RT PCR (Flu A&B, Covid) - Nasopharyngeal Swab     Status: None   Collection Time: 09/12/19 11:39 PM   Specimen: Nasopharyngeal Swab  Result Value Ref Range Status   SARS Coronavirus 2 by RT PCR NEGATIVE NEGATIVE Final    Comment: (NOTE)  SARS-CoV-2 target nucleic acids are NOT DETECTED. The SARS-CoV-2 RNA is generally detectable in upper respiratoy specimens during the acute phase of infection. The lowest concentration of SARS-CoV-2 viral copies this assay can detect is 131 copies/mL. A negative result does not preclude SARS-Cov-2 infection and should not be used as the sole basis for treatment or other patient management decisions. A negative result may occur with  improper specimen collection/handling, submission of specimen other than nasopharyngeal swab, presence of viral mutation(s) within the areas targeted by this assay, and inadequate number of viral copies (<131 copies/mL). A negative result must be combined with clinical observations, patient history, and epidemiological information. The expected result is Negative. Fact Sheet for Patients:  https://www.moore.com/ Fact Sheet for Healthcare Providers:  https://www.young.biz/ This test is not yet ap proved or cleared by the [place] FDA and  has been authorized for detection and/or diagnosis of SARS-CoV-2 by FDA under an Emergency Use Authorization (EUA). This EUA will remain  in effect (meaning this test can be used) for the duration of the COVID-19 declaration under Section 564(b)(1) of the Act, 21 U.S.C. section  360bbb-3(b)(1), unless the authorization is terminated or revoked sooner.    Influenza A by PCR NEGATIVE NEGATIVE Final   Influenza B by PCR NEGATIVE NEGATIVE Final    Comment: (NOTE) The Xpert Xpress SARS-CoV-2/FLU/RSV assay is intended as an aid in  the diagnosis of influenza from Nasopharyngeal swab specimens and  should not be used as a sole basis for treatment. Nasal washings and  aspirates are unacceptable for Xpert Xpress SARS-CoV-2/FLU/RSV  testing. Fact Sheet for Patients: https://www.moore.com/ Fact Sheet for Healthcare Providers: https://www.young.biz/ This test is not yet approved or cleared by the [place] FDA and  has been authorized for detection and/or diagnosis of SARS-CoV-2 by  FDA under an Emergency Use Authorization (EUA). This EUA will remain  in effect (meaning this test can be used) for the duration of the  Covid-19 declaration under Section 564(b)(1) of the Act, 21  U.S.C. section 360bbb-3(b)(1), unless the authorization is  terminated or revoked. Performed at [facility], [address] [place], [place] [address]   MRSA PCR Screening     Status: None   Collection Time: 09/13/19  7:57 AM   Specimen: Nasal Mucosa; Nasopharyngeal  Result Value Ref Range Status   MRSA by PCR NEGATIVE NEGATIVE Final    Comment:        The GeneXpert MRSA Assay (FDA approved for NASAL specimens only), is one component of a comprehensive MRSA colonization surveillance program. It is not intended to diagnose MRSA infection nor to guide or monitor treatment for MRSA infections. Performed at [facility], [address], [place], [place] [address]   Body fluid culture     Status: None (Preliminary result)   Collection Time: 09/13/19  9:32 AM   Specimen: Other Source; Body Fluid  Result Value Ref Range Status   Specimen Description BRONCHIAL WASHINGS  Final   Special Requests NONE  Final   Gram Stain   Final    FEW WBC  PRESENT, PREDOMINANTLY PMN NO ORGANISMS SEEN Performed at [facility], [address], [place], [place] [address]    Culture PENDING  Incomplete   Report Status PENDING  Incomplete  Body fluid culture     Status: None (Preliminary result)   Collection Time: 09/13/19 10:22 AM   Specimen: Other Source; Body Fluid  Result Value Ref Range Status   Specimen Description THORACENTESIS  Final  Special Requests LEFT HEMOTHORAX  Final   Gram Stain   Final    FEW WBC PRESENT, PREDOMINANTLY PMN NO ORGANISMS SEEN Performed at [facility], [address], [place], [place] [address]    Culture PENDING  Incomplete   Report Status PENDING  Incomplete         Radiology Studies: DG Ribs Unilateral W/Chest Left  Result Date: 09/12/2019 CLINICAL DATA:  Left axillary rib pain, fell 2 weeks ago EXAM: LEFT RIBS AND CHEST - 3+ VIEW COMPARISON:  01/05/2019 FINDINGS: Frontal and oblique views of the left thoracic cage are obtained. There are minimally displaced left lateral ninth and tenth rib fractures. Small left pleural effusion likely reflects hemothorax. No pneumothorax. Consolidation left lung base likely atelectasis. Cardiac silhouette is unremarkable. IMPRESSION: 1. Displaced left lateral ninth and tenth rib fractures, with small left pleural effusion likely reflecting hemothorax. 2. No evidence of pneumothorax. Electronically Signed   By: Randa Ngo M.D.   On: 09/12/2019 16:48   CT Chest W Contrast  Result Date: 09/12/2019 CLINICAL DATA:  Abdominal pain. Pain status post fall 2 weeks ago. Left-sided rib cage pain EXAM: CT CHEST, ABDOMEN, AND PELVIS WITH CONTRAST TECHNIQUE: Multidetector CT imaging of the chest, abdomen and pelvis was performed following the standard protocol during bolus administration of intravenous contrast. CONTRAST:  43mL OMNIPAQUE IOHEXOL 300 MG/ML  SOLN COMPARISON:  CT chest dated 04/13/2013. FINDINGS: CT CHEST FINDINGS Cardiovascular: The heart size is normal.  The ascending thoracic aorta is ectatic measuring approximately 3.9 cm in diameter. There is no evidence for dissection. The arch vessels are grossly patent where visualized. The main pulmonary artery is within normal limits. There is no large centrally located pulmonary embolism. Coronary artery calcifications are noted. There are minimal calcifications of the thoracic aorta. Mediastinum/Nodes: --No mediastinal or hilar lymphadenopathy. --No axillary lymphadenopathy. --No supraclavicular lymphadenopathy. --multiple thyroid nodules are noted. The largest thyroid nodule measures up to approximately 1 cm (axial series 2, image 13). No followup recommended (ref: J Am Coll Radiol. 2015 Feb;12(2): 143-50). --The esophagus is unremarkable Lungs/Pleura: There is a large left-sided pleural effusion. The pleural fluid measures approximately 28-30 Hounsfield units consistent with a probable left-sided hemothorax. There is partial collapse of the left lower lobe. There is no pneumothorax. Mild emphysematous changes are noted. There is scattered bilateral micro nodules that appears centrally stable since the patient's prior study in 2014 and as such no further follow-up is required. Musculoskeletal: There are multiple acute to subacute displaced and nondisplaced left-sided rib fractures. The rib fractures involve the anterior sixth rib on the patient's left in addition to the posterior eighth through twelfth ribs. There are segmental fractures involving the eighth through eleventh ribs. There is a chronic appearing fracture of the superior sternal body. CT ABDOMEN PELVIS FINDINGS Hepatobiliary: There is severe hepatic steatosis. Cholelithiasis without acute inflammation.There is no biliary ductal dilation. Pancreas: There are multiple calcifications throughout the patient's pancreas consistent with chronic pancreatitis. Spleen: No splenic laceration or hematoma. Adrenals/Urinary Tract: --Adrenal glands: No adrenal hemorrhage.  --Right kidney/ureter: No hydronephrosis or perinephric hematoma. --Left kidney/ureter: No hydronephrosis or perinephric hematoma. --Urinary bladder: Unremarkable. Stomach/Bowel: --Stomach/Duodenum: No hiatal hernia or other gastric abnormality. Normal duodenal course and caliber. --Small bowel: No dilatation or inflammation. --Colon: No focal abnormality. --Appendix: Normal. Vascular/Lymphatic: Atherosclerotic calcification is present within the non-aneurysmal abdominal aorta, without hemodynamically significant stenosis. --No retroperitoneal lymphadenopathy. --No mesenteric lymphadenopathy. --No pelvic or inguinal lymphadenopathy. Reproductive: Status post hysterectomy. No adnexal mass. Other: No ascites or  free air. There is a fat containing umbilical hernia. Musculoskeletal. There are age-indeterminate compression fractures of the L3 and L4 vertebral bodies with approximately 20% height loss at the L3 level and 25% height loss at the L4 level. Both of these fractures are favored to be acute to subacute. There are 5 non rib-bearing lumbar type vertebral bodies. There is grade 1 anterolisthesis of L4 on L5. IMPRESSION: 1. Large left-sided pleural effusion with partial collapse of the left lower lobe. There is increased density involving this left-sided pleural effusion suggesting that may in fact represent a left-sided hemothorax related to the patient's left-sided rib fractures. 2. Multiple acute to subacute displaced and nondisplaced left-sided rib fractures as detailed above. There are segmental fractures of the eighth through eleventh ribs on the left. 3. Age-indeterminate compression fractures of the L3 and L4 vertebral bodies. These are favored to be acute to subacute but should be correlated with point tenderness. 4. Hepatic steatosis. 5. Cholelithiasis without acute inflammation. 6. Chronic pancreatitis. Aortic Atherosclerosis (ICD10-I70.0). Electronically Signed   By: Katherine Mantle M.D.   On:  09/12/2019 20:23   CT Abdomen Pelvis W Contrast  Result Date: 09/12/2019 CLINICAL DATA:  Abdominal pain. Pain status post fall 2 weeks ago. Left-sided rib cage pain EXAM: CT CHEST, ABDOMEN, AND PELVIS WITH CONTRAST TECHNIQUE: Multidetector CT imaging of the chest, abdomen and pelvis was performed following the standard protocol during bolus administration of intravenous contrast. CONTRAST:  30mL OMNIPAQUE IOHEXOL 300 MG/ML  SOLN COMPARISON:  CT chest dated 04/13/2013. FINDINGS: CT CHEST FINDINGS Cardiovascular: The heart size is normal. The ascending thoracic aorta is ectatic measuring approximately 3.9 cm in diameter. There is no evidence for dissection. The arch vessels are grossly patent where visualized. The main pulmonary artery is within normal limits. There is no large centrally located pulmonary embolism. Coronary artery calcifications are noted. There are minimal calcifications of the thoracic aorta. Mediastinum/Nodes: --No mediastinal or hilar lymphadenopathy. --No axillary lymphadenopathy. --No supraclavicular lymphadenopathy. --multiple thyroid nodules are noted. The largest thyroid nodule measures up to approximately 1 cm (axial series 2, image 13). No followup recommended (ref: J Am Coll Radiol. 2015 Feb;12(2): 143-50). --The esophagus is unremarkable Lungs/Pleura: There is a large left-sided pleural effusion. The pleural fluid measures approximately 28-30 Hounsfield units consistent with a probable left-sided hemothorax. There is partial collapse of the left lower lobe. There is no pneumothorax. Mild emphysematous changes are noted. There is scattered bilateral micro nodules that appears centrally stable since the patient's prior study in 2014 and as such no further follow-up is required. Musculoskeletal: There are multiple acute to subacute displaced and nondisplaced left-sided rib fractures. The rib fractures involve the anterior sixth rib on the patient's left in addition to the posterior  eighth through twelfth ribs. There are segmental fractures involving the eighth through eleventh ribs. There is a chronic appearing fracture of the superior sternal body. CT ABDOMEN PELVIS FINDINGS Hepatobiliary: There is severe hepatic steatosis. Cholelithiasis without acute inflammation.There is no biliary ductal dilation. Pancreas: There are multiple calcifications throughout the patient's pancreas consistent with chronic pancreatitis. Spleen: No splenic laceration or hematoma. Adrenals/Urinary Tract: --Adrenal glands: No adrenal hemorrhage. --Right kidney/ureter: No hydronephrosis or perinephric hematoma. --Left kidney/ureter: No hydronephrosis or perinephric hematoma. --Urinary bladder: Unremarkable. Stomach/Bowel: --Stomach/Duodenum: No hiatal hernia or other gastric abnormality. Normal duodenal course and caliber. --Small bowel: No dilatation or inflammation. --Colon: No focal abnormality. --Appendix: Normal. Vascular/Lymphatic: Atherosclerotic calcification is present within the non-aneurysmal abdominal aorta, without hemodynamically significant stenosis. --No retroperitoneal lymphadenopathy. --No  mesenteric lymphadenopathy. --No pelvic or inguinal lymphadenopathy. Reproductive: Status post hysterectomy. No adnexal mass. Other: No ascites or free air. There is a fat containing umbilical hernia. Musculoskeletal. There are age-indeterminate compression fractures of the L3 and L4 vertebral bodies with approximately 20% height loss at the L3 level and 25% height loss at the L4 level. Both of these fractures are favored to be acute to subacute. There are 5 non rib-bearing lumbar type vertebral bodies. There is grade 1 anterolisthesis of L4 on L5. IMPRESSION: 1. Large left-sided pleural effusion with partial collapse of the left lower lobe. There is increased density involving this left-sided pleural effusion suggesting that may in fact represent a left-sided hemothorax related to the patient's left-sided rib  fractures. 2. Multiple acute to subacute displaced and nondisplaced left-sided rib fractures as detailed above. There are segmental fractures of the eighth through eleventh ribs on the left. 3. Age-indeterminate compression fractures of the L3 and L4 vertebral bodies. These are favored to be acute to subacute but should be correlated with point tenderness. 4. Hepatic steatosis. 5. Cholelithiasis without acute inflammation. 6. Chronic pancreatitis. Aortic Atherosclerosis (ICD10-I70.0). Electronically Signed   By: Katherine Mantlehristopher  Green M.D.   On: 09/12/2019 20:23   DG Chest Port 1 View  Result Date: 09/13/2019 CLINICAL DATA:  Postop VATS, chest tube EXAM: PORTABLE CHEST 1 VIEW COMPARISON:  09/12/2019 FINDINGS: Interval removal of left-sided pigtail chest tube and placement of large-bore chest tube, tip about the left apex. Near complete resolution of previously seen left pleural effusion. No significant pneumothorax. Heart and mediastinum are unremarkable. Left-sided rib fractures are not well appreciated. IMPRESSION: Interval removal of left-sided pigtail chest tube and placement of large-bore chest tube, tip about the left apex. Near complete resolution of previously seen left pleural effusion. No significant pneumothorax. Electronically Signed   By: Lauralyn PrimesAlex  Bibbey M.D.   On: 09/13/2019 11:56   DG Chest Portable 1 View  Result Date: 09/12/2019 CLINICAL DATA:  Status post chest tube placement EXAM: PORTABLE CHEST 1 VIEW COMPARISON:  Films from earlier in the same day. FINDINGS: Cardiac shadow is stable in appearance. Left-sided pleural effusion is again identified. Left chest tube is now noted in place. No pneumothorax is seen. Right lung remains clear. Cardiac shadow is stable. Aortic calcifications are noted. Previously seen left rib fractures are again noted. IMPRESSION: Interval chest tube placement. Persistent effusion is noted on the left. Left rib fractures are seen without pneumothorax. Electronically  Signed   By: Alcide CleverMark  Lukens M.D.   On: 09/12/2019 23:05        Scheduled Meds: . acetaminophen  1,000 mg Oral Q6H   Or  . acetaminophen (TYLENOL) oral liquid 160 mg/5 mL  1,000 mg Oral Q6H  . albuterol  2.5 mg Nebulization Q4H while awake  . bisacodyl  10 mg Oral Daily  . folic acid  1 mg Oral Daily  . insulin aspart  0-24 Units Subcutaneous Q6H  . nicotine  14 mg Transdermal Daily  . pantoprazole (PROTONIX) IV  40 mg Intravenous QHS  . senna-docusate  1 tablet Oral QHS  . thiamine  100 mg Oral Daily   Or  . thiamine  100 mg Intravenous Daily   Continuous Infusions:   LOS: 1 day    Time spent: 25 minutes    Alberteen Samhristopher P Nao Linz, MD [facility] 09/14/2019, 8:15 AM     Please page though AMION or Epic secure chat:  For [employer] password, Higher education careers advisercontact charge nurse

## 2019-09-15 ENCOUNTER — Inpatient Hospital Stay (HOSPITAL_COMMUNITY): Payer: Medicaid Other

## 2019-09-15 LAB — COMPREHENSIVE METABOLIC PANEL
ALT: 13 U/L (ref 0–44)
AST: 34 U/L (ref 15–41)
Albumin: 2.6 g/dL — ABNORMAL LOW (ref 3.5–5.0)
Alkaline Phosphatase: 88 U/L (ref 38–126)
Anion gap: 5 (ref 5–15)
BUN: 5 mg/dL — ABNORMAL LOW (ref 8–23)
CO2: 27 mmol/L (ref 22–32)
Calcium: 8.4 mg/dL — ABNORMAL LOW (ref 8.9–10.3)
Chloride: 98 mmol/L (ref 98–111)
Creatinine, Ser: 0.56 mg/dL (ref 0.44–1.00)
GFR calc Af Amer: >60 mL/min (ref >60)
GFR calc non Af Amer: >60 mL/min (ref >60)
Glucose, Bld: 100 mg/dL — ABNORMAL HIGH (ref 70–99)
Potassium: 3.5 mmol/L (ref 3.5–5.1)
Sodium: 130 mmol/L — ABNORMAL LOW (ref 135–145)
Total Bilirubin: 0.6 mg/dL (ref 0.3–1.2)
Total Protein: 5.2 g/dL — ABNORMAL LOW (ref 6.5–8.1)

## 2019-09-15 LAB — BODY FLUID CULTURE: Culture: NORMAL

## 2019-09-15 LAB — CBC
HCT: 27.7 % — ABNORMAL LOW (ref 36.0–46.0)
Hemoglobin: 9.6 g/dL — ABNORMAL LOW (ref 12.0–15.0)
MCH: 30.6 pg (ref 26.0–34.0)
MCHC: 34.7 g/dL (ref 30.0–36.0)
MCV: 88.2 fL (ref 80.0–100.0)
Platelets: 101 10*3/uL — ABNORMAL LOW (ref 150–400)
RBC: 3.14 MIL/uL — ABNORMAL LOW (ref 3.87–5.11)
RDW: 13.4 % (ref 11.5–15.5)
WBC: 4.4 10*3/uL (ref 4.0–10.5)
nRBC: 0 % (ref 0.0–0.2)

## 2019-09-15 LAB — FERRITIN: Ferritin: 400 ng/mL — ABNORMAL HIGH (ref 11–307)

## 2019-09-15 LAB — IRON AND TIBC
Iron: 12 ug/dL — ABNORMAL LOW (ref 28–170)
Saturation Ratios: 8 % — ABNORMAL LOW (ref 10.4–31.8)
TIBC: 157 ug/dL — ABNORMAL LOW (ref 250–450)
UIBC: 145 ug/dL

## 2019-09-15 LAB — VITAMIN B12: Vitamin B-12: 500 pg/mL (ref 180–914)

## 2019-09-15 MED ORDER — POTASSIUM CHLORIDE CRYS ER 20 MEQ PO TBCR
40.0000 meq | EXTENDED_RELEASE_TABLET | Freq: Once | ORAL | Status: AC
  Administered 2019-09-15: 08:00:00 40 meq via ORAL
  Filled 2019-09-15: qty 2

## 2019-09-15 NOTE — Op Note (Signed)
NAME: AXEL, MEAS MEDICAL RECORD ZC:[id number] ACCOUNT [id number] DATE OF BIRTH:09/08/1955 FACILITY: [facility] LOCATION: [facility] PHYSICIAN:Myrlene Riera Bari Xylina Rhoads, MD  OPERATIVE REPORT  DATE OF PROCEDURE:  09/13/2019  PREOPERATIVE DIAGNOSIS:  Left hemothorax secondary to rib fractures.  POSTOPERATIVE DIAGNOSIS:  Left hemothorax secondary to rib fractures.  SURGICAL PROCEDURES:   1.  Bronchoscopy.  2.  Left video-assisted thoracoscopy.  3.  Evacuation of hemothorax.   4.  Intercostal nerve blocks.  SURGEON:  Sheliah Plane, MD  FIRST ASSISTANT:  Gershon Crane, [place].  BRIEF HISTORY:  The patient is a 64 year old female who was transferred from the [facility] emergency room during the night of 09/12/2019.  She was sent to the emergency room by her primary care doctor after her daughter noted that the patient had dark  stools and was told that she had blood in her stool and should go to the emergency room.  On evaluation in the emergency room, she was told that she did not have blood in her stool, but a chest x-ray showed significant left pleural effusion.  The   emergency room staff consulted the trauma service because the patient was noted 2 weeks before to have been falling in her house and was found to have multiple rib fractures, assuming hemothorax related to rib fractures.  A pigtail catheter was placed at  the bedside by the ER physician at [facility].  Unfortunately, this chest tube was not positioned in a manner to drain any fluid and very little came out.  She was transferred to [facility] for consideration of video-assisted thoracoscopy.  After review of  the films and seeing the patient, it was recommended to her that we proceed with left video-assisted thoracoscopy and drainage of her hemothorax.  The patient and her daughter agreed.  DESCRIPTION OF PROCEDURE:  The patient underwent general endotracheal anesthesia with a double lumen endotracheal tube.  Through the double lumen endotracheal  tube, a fiberoptic bronchoscope was passed after appropriate timeout was done.  The tube was in  good position.  There were no endobronchial lesions appreciated.  Bronchial washings from the left lower lobe were obtained and sent for cultures.  The scope was removed.  The patient was turned in the lateral decubitus position with the left side up.   The left chest was prepped with Betadine, draped in usual sterile manner.  A second timeout was performed.  The previously placed chest tube had been removed.  We then using OptiSeal 5 mm trocar and a 0-degree scope entered into the left pleural space.   Insufflation confirmed we were in proper position.  The 0-degree scope was transferred to a 30-degree scope and the left pleural space was carefully examined.  There was a significant amount of bloody fluid within the chest and a small amount of blood  clots, actually less than was anticipated.  Thinking radiographically on CT, it looked more like a clotted hemothorax.  Under direct vision, a second port was placed slightly more posteriorly, 6th intercostal space.  Through the 5 mm port and with the  scope in place, we were able to evacuate 1000 mL of old bloody fluid.  With the fluid completely evacuated, at the base there were some areas of clot that were suctioned and physically removed.  There was no significant peel on the lung and the lung  reinflated without difficulty.  We then did an intercostal nerve block with a solution of saline, Nupercaine and Exparel from approximately the 3rd intercostal space posteriorly to  the 10th.  A needle from the posterior skin surface was introduced into  the intercostal spaces and under direct vision through the scope, we watched as the Nupercaine solution was injected in the intercostal spaces With this completed, we then placed a 28 Blake drain through the more anterior port and the posterior port was  closed with 0 Vicryl and a 3-0 subcuticular stitch.  The lung was  reinflated.  Estimated blood loss other than the blood that was already present in the chest was minimal.  The patient was awakened in the operating room and extubated and transferred to  the recovery room in good condition.  Sponge and needle count was reported as correct at completion of procedure.  VN/NUANCE  D:09/15/2019 T:09/15/2019 JOB:[id number]

## 2019-09-15 NOTE — Progress Notes (Signed)
Advised by Crystal/Microbiology---cultures documented as growing influenza from thoracentesis cultures taken on this patient have been documented incorrectly--this patient has normal respiratory flora, not influenza----tried to contact [facility] at [phone], will try contacting Dr. Maryfrances Bunnell and Dr Tyrone Sage, as well

## 2019-09-15 NOTE — Plan of Care (Signed)

## 2019-09-15 NOTE — Progress Notes (Addendum)
      [address]       [place] [address]             [phone]       2 Days Post-Op Procedure(s) (LRB): VIDEO ASSISTED THORACOSCOPY (VATS) EVACUATION of HEMOTHORAX WITH INTERCOSTAL NERVE BLOCK (Left) Video Bronchoscopy (N/A)  Subjective: Patient without specific complaint this am. She is about to eat breakfast.  Objective: Vital signs in last 24 hours: Temp:  [97.3 F (36.3 C)-98.5 F (36.9 C)] 97.8 F (36.6 C) (04/02 0321) Pulse Rate:  [98-110] 98 (04/02 0321) Cardiac Rhythm: Normal sinus rhythm (04/01 1900) Resp:  [10-21] 14 (04/02 0321) BP: (88-112)/(60-81) 112/77 (04/02 0321) SpO2:  [90 %-100 %] 98 % (04/02 0321) Arterial Line BP: (103)/(59) 103/59 (04/01 0800)      Intake/Output from previous day: 04/01 0701 - 04/02 0700 In: 1000 [P.O.:950] Out: 310 [Urine:250; Chest Tube:60]   Physical Exam:  Cardiovascular: Slightly tachycardic Pulmonary: Clear to auscultation on the right, and slightly diminished at left base Abdomen: Soft, non tender, bowel sounds present. Extremities: No lower extremity edema. Wounds: Dressing is clean and dry.   Chest Tube: to suction, no air leak  Lab Results: CBC: Recent Labs    09/14/19 0324 09/15/19 0243  WBC 6.0 4.4  HGB 10.0* 9.6*  HCT 28.8* 27.7*  PLT 109* 101*   BMET:  Recent Labs    09/14/19 0324 09/15/19 0243  NA 129* 130*  K 3.6 3.5  CL 95* 98  CO2 23 27  GLUCOSE 83 100*  BUN <5* 5*  CREATININE 0.61 0.56  CALCIUM 8.7* 8.4*    PT/INR: No results for input(s): LABPROT, INR in the last 72 hours. ABG:  INR: Will add last result for INR, ABG once components are confirmed Will add last 4 CBG results once components are confirmed  Assessment/Plan:  1. CV - ST with HR 2.  Pulmonary - S/p drain hemothorax. Gram stain showed no organisms and culture shows no growth < 24 hours. History of COPD. On room air this am. Chest tube with 60 cc this am an no air leak. CXR this am appears to show  subcutaneous emphysema left lateral chest wall and neck, possible trace left apical pneumothorax.  Likely place to water seal.Encourage incentive spirometer. 3. Chronic normocytic anemia-H and H this am 9.6 and 27.7 4. Hyponatremia-sodium this am 130. 5. History of etoh abuse- On Thiamine. Per primary 7. Thrombocytopenia-platelets this am slightly decreased to 101,000 8. Hypomagnesemia-1.3 this am and being supplemented. 9. Supplement potassium  Donielle M ZimmermanPA-C 09/15/2019,7:02 AM [phone]  Patient was original sent to [facility] ER by primary care because of blood in stool- - rib fractures and hemothorax was "incidential" finding after she was seen in ER  Ct to water seal today- likely remove tomorrow  I have seen and examined Levi Aland and agree with the above assessment  and plan.  Delight Ovens MD Beeper [phone] Office [phone] 09/15/2019 7:43 AM

## 2019-09-15 NOTE — Progress Notes (Signed)
PROGRESS NOTE    G[place]ia Santana  ZOX:[id number] DOB: May 23, 1956 DOA: 09/12/2019 PCP: Avon GullyFanta, Tesfaye, MD      Brief Narrative:  Gloria Santana is a 64 y.o. F with hx depression and alcohol use disorder, possible COPD who presented with 2 weeks chest pain after a fall.  Patient fell at home between the toilet and the wall about 2 weeks ago.  Since then she has been having progressive left-sided chest pain progressive weakness, shortness of breath and pleuritic pain, which became severe enough that she came to the emergency room.  In the ER, radiograph showed rib fractures of ribs 8-12.  CT showed a moderate to large left-sided hemothorax.  Chest tube was placed in the ER with a scant return.  Case was discussed with CT surgery, who recommended VATS procedure.           Assessment & Plan:  Hemothorax, left side due to traumatic rib fracture S/p VATS 3/31 by Dr. Tyrone SageGerhardt -Per CT surgery     Possible COPD Smoking No evidence of bronchospasm.  Smoking cessation recommended. -Duo nebs as needed -Continue nicotine supplement  Diabetes ruled out  Hyponatremia Asymptomatic, mild, stable.  Normocytic anemia Thrombocytopenia Hemoglobin trending down, but other than expected scant blood in chest tube, no clinical bleeding.  FOBT negative.  Plts stable.    Alcohol use disorder No symptoms of withdrawal -Stop CIWA     Disposition: Patient was admitted with hemothorax due to rib fractures.  She underwent VATS, has had an uncomplicated postop course  Patient continues to have a chest tube, will need ongoing hospital care.  I will discharge when CT surgery has cleared the patient for discharge.         MDM: The below labs and imaging reports reviewed and summarized above.  Medication management as above.        DVT prophylaxis: SCDs Code Status: FULL Family Communication:     Consultants:   CT surgery  Procedures:   3/31 VATS  Antimicrobials:    Cefazolin x1 perioperatively  Culture data:   3/31 pleural fluid culture, no growth to date          Subjective: No headache, chest pain, confusion.  She has a mild cough, no fever normal sputum        Objective: Vitals:   09/15/19 0321 09/15/19 0740 09/15/19 1036 09/15/19 1100  BP: 112/77 (!) 145/95 109/71   Pulse: 98 (!) 115 97   Resp: 14 13 14    Temp: 97.8 F (36.6 C) 97.7 F (36.5 C) 98.5 F (36.9 C)   TempSrc: Oral Oral Oral   SpO2: 98% 100% 100% 100%  Weight:      Height:        Intake/Output Summary (Last 24 hours) at 09/15/2019 1431 Last data filed at 09/15/2019 1200 Gross per 24 hour  Intake 1010 ml  Output 330 ml  Net 680 ml   Filed Weights   09/12/19 1455 09/13/19 0233 09/13/19 0827  Weight: 49 kg 50.9 kg 50.9 kg    Examination: General appearance: Thin elderly adult female, alert and in no acute distress.  Lying in bed. HEENT: Anicteric, conjunctiva pink, lids and lashes normal. No nasal deformity, discharge, epistaxis.  Lips moist, in place, oropharynx hydrated, no oral lesion, hearing normal  Skin: Warm and dry.  No suspicious rashes or lesions. Cardiac: RRR, no murmurs appreciated.  No LE edema.    Respiratory: Normal respiratory rate and rhythm.  CTAB without rales or wheezes.  Diminished on the left. Abdomen: Abdomen soft.  No tenderness palpation or guarding. No ascites, distension, hepatosplenomegaly.   MSK: No deformities or effusions of the large joints of the upper or lower extremities bilaterally.  Diffuse, mildly depleted subcutaneous muscle mass of back. Neuro: Awake and alert. Naming is grossly intact, and the patient's recall, recent and remote, as well as general fund of knowledge seem within normal limits.  Muscle tone normal, without fasciculations.  Moves all extremities equally and with normal coordination.  Speech fluent.    Psych: Sensorium intact and responding to questions, attention normal. Affect blunted.  Judgment and  insight appear normal.      Data Reviewed: I have personally reviewed following labs and imaging studies:  CBC: Recent Labs  Lab 09/12/19 1605 09/13/19 1020 09/13/19 1744 09/14/19 0324 09/15/19 0243  WBC 4.4  --  5.2 6.0 4.4  NEUTROABS 2.7  --   --   --   --   HGB 11.4* 10.2* 11.5* 10.0* 9.6*  HCT 34.1* 30.0* 34.0* 28.8* 27.7*  MCV 90.5  --  89.0 88.3 88.2  PLT 116*  --  121* 109* 101*   Basic Metabolic Panel: Recent Labs  Lab 09/12/19 1605 09/13/19 1020 09/13/19 1255 09/14/19 0324 09/15/19 0243  NA 126* 131* 132* 129* 130*  K 3.6 2.8* 3.4* 3.6 3.5  CL 88*  --  98 95* 98  CO2 26  --  21* 23 27  GLUCOSE 96  --  113* 83 100*  BUN 7*  --  <5* <5* 5*  CREATININE 0.49  --  0.55 0.61 0.56  CALCIUM 9.5  --  8.7* 8.7* 8.4*  MG  --   --   --  1.3*  --    GFR: Estimated Creatinine Clearance: 57.1 mL/min (by C-G formula based on SCr of 0.56 mg/dL). Liver Function Tests: Recent Labs  Lab 09/12/19 1605 09/13/19 1255 09/14/19 0324 09/15/19 0243  AST 65* 51* 45* 34  ALT 21 17 17 13   ALKPHOS 132* 90 85 88  BILITOT 1.9* 1.6* 1.1 0.6  PROT 7.6 5.9* 5.6* 5.2*  ALBUMIN 3.8 3.2* 2.9* 2.6*   Recent Labs  Lab 09/12/19 1605  LIPASE 40   No results for input(s): AMMONIA in the last 168 hours. Coagulation Profile: No results for input(s): INR, PROTIME in the last 168 hours. Cardiac Enzymes: No results for input(s): CKTOTAL, CKMB, CKMBINDEX, TROPONINI in the last 168 hours. BNP (last 3 results) No results for input(s): PROBNP in the last 8760 hours. HbA1C: Recent Labs    09/12/19 1605  HGBA1C 4.5*   CBG: Recent Labs  Lab 09/13/19 0759 09/13/19 1111 09/13/19 1633 09/13/19 2343 09/14/19 0545  GLUCAP 79 102* 142* 145* 117*   Lipid Profile: No results for input(s): CHOL, HDL, LDLCALC, TRIG, CHOLHDL, LDLDIRECT in the last 72 hours. Thyroid Function Tests: No results for input(s): TSH, T4TOTAL, FREET4, T3FREE, THYROIDAB in the last 72 hours. Anemia  Panel: Recent Labs    09/15/19 0243  VITAMINB12 500  FERRITIN 400*  TIBC 157*  IRON 12*   Urine analysis:    Component Value Date/Time   COLORURINE YELLOW 09/13/2019 1522   APPEARANCEUR CLEAR 09/13/2019 1522   LABSPEC 1.011 09/13/2019 1522   PHURINE 6.0 09/13/2019 1522   GLUCOSEU NEGATIVE 09/13/2019 1522   HGBUR SMALL (A) 09/13/2019 1522   BILIRUBINUR NEGATIVE 09/13/2019 1522   KETONESUR 5 (A) 09/13/2019 1522   PROTEINUR NEGATIVE 09/13/2019 1522   UROBILINOGEN 0.2 11/26/2014 2053   NITRITE  NEGATIVE 09/13/2019 1522   LEUKOCYTESUR NEGATIVE 09/13/2019 1522   Sepsis Labs: @LABRCNTIP (procalcitonin:4,lacticacidven:4)  ) Recent Results (from the past 240 hour(s))  Respiratory Panel by RT PCR (Flu A&B, Covid) - Nasopharyngeal Swab     Status: None   Collection Time: 09/12/19 11:39 PM   Specimen: Nasopharyngeal Swab  Result Value Ref Range Status   SARS Coronavirus 2 by RT PCR NEGATIVE NEGATIVE Final    Comment: (NOTE) SARS-CoV-2 target nucleic acids are NOT DETECTED. The SARS-CoV-2 RNA is generally detectable in upper respiratoy specimens during the acute phase of infection. The lowest concentration of SARS-CoV-2 viral copies this assay can detect is 131 copies/mL. A negative result does not preclude SARS-Cov-2 infection and should not be used as the sole basis for treatment or other patient management decisions. A negative result may occur with  improper specimen collection/handling, submission of specimen other than nasopharyngeal swab, presence of viral mutation(s) within the areas targeted by this assay, and inadequate number of viral copies (<131 copies/mL). A negative result must be combined with clinical observations, patient history, and epidemiological information. The expected result is Negative. Fact Sheet for Patients:  09/14/19 Fact Sheet for Healthcare Providers:  https://www.moore.com/ This test is not yet  ap proved or cleared by the https://www.young.biz/ FDA and  has been authorized for detection and/or diagnosis of SARS-CoV-2 by FDA under an Emergency Use Authorization (EUA). This EUA will remain  in effect (meaning this test can be used) for the duration of the COVID-19 declaration under Section 564(b)(1) of the Act, 21 U.S.C. section 360bbb-3(b)(1), unless the authorization is terminated or revoked sooner.    Influenza A by PCR NEGATIVE NEGATIVE Final   Influenza B by PCR NEGATIVE NEGATIVE Final    Comment: (NOTE) The Xpert Xpress SARS-CoV-2/FLU/RSV assay is intended as an aid in  the diagnosis of influenza from Nasopharyngeal swab specimens and  should not be used as a sole basis for treatment. Nasal washings and  aspirates are unacceptable for Xpert Xpress SARS-CoV-2/FLU/RSV  testing. Fact Sheet for Patients: [place] Fact Sheet for Healthcare Providers: https://www.moore.com/ This test is not yet approved or cleared by the https://www.young.biz/ FDA and  has been authorized for detection and/or diagnosis of SARS-CoV-2 by  FDA under an Emergency Use Authorization (EUA). This EUA will remain  in effect (meaning this test can be used) for the duration of the  Covid-19 declaration under Section 564(b)(1) of the Act, 21  U.S.C. section 360bbb-3(b)(1), unless the authorization is  terminated or revoked. Performed at [facility], [address], [place], [place]   MRSA PCR Screening     Status: None   Collection Time: 09/13/19  7:57 AM   Specimen: Nasal Mucosa; Nasopharyngeal  Result Value Ref Range Status   MRSA by PCR NEGATIVE NEGATIVE Final    Comment:        The GeneXpert MRSA Assay (FDA approved for NASAL specimens only), is one component of a comprehensive MRSA colonization surveillance program. It is not intended to diagnose MRSA infection nor to guide or monitor treatment for MRSA infections. Performed at [facility], [address] [place], [place]   Anaerobic culture     Status: None (Preliminary result)   Collection Time: 09/13/19  9:32 AM   Specimen: Other Source; Body Fluid  Result Value Ref Range Status   Specimen Description BRONCHIAL WASHINGS  Final   Special Requests   Final    NONE Performed at [facility], [address] [place], [place] [address]    Culture   Final    NO ANAEROBES ISOLATED; CULTURE IN PROGRESS FOR 5 DAYS   Report Status PENDING  Incomplete  Body fluid culture     Status: None (Preliminary result)   Collection Time: 09/13/19  9:32 AM   Specimen: Other Source; Body Fluid  Result Value Ref Range Status   Specimen Description BRONCHIAL WASHINGS  Final   Special Requests NONE  Final   Gram Stain   Final    FEW WBC PRESENT, PREDOMINANTLY PMN NO ORGANISMS SEEN    Culture   Final    RARE HAEMOPHILUS INFLUENZAE BETA LACTAMASE NEGATIVE Performed at [facility], [address], [place], [place] [address]    Report Status PENDING  Incomplete  Acid Fast Smear (AFB)     Status: None   Collection Time: 09/13/19  9:32 AM   Specimen: Other Source; Body Fluid  Result Value Ref Range Status   AFB Specimen Processing Concentration  Final   Acid Fast Smear Negative  Final    Comment: (NOTE) Performed At: [facility] [address] [place], [place] [id number] Jolene Schimke MD FY:[id number]    Source (AFB) BRONCHIAL WASHINGS  Final    Comment: Performed at [facility], [address], [place], [place] [address]  Body fluid culture     Status: None (Preliminary result)   Collection Time: 09/13/19 10:22 AM   Specimen: Other Source; Body Fluid  Result Value Ref Range Status   Specimen Description THORACENTESIS  Final   Special Requests LEFT HEMOTHORAX  Final   Gram Stain   Final    FEW WBC PRESENT, PREDOMINANTLY PMN NO ORGANISMS SEEN    Culture   Final    NO GROWTH 2 DAYS Performed at [facility], [address], [place], [place] [address]    Report Status PENDING  Incomplete         Radiology Studies: DG CHEST PORT 1 VIEW  Result Date: 09/15/2019 CLINICAL DATA:  64 year old female with history of pneumothorax. EXAM: PORTABLE CHEST 1 VIEW COMPARISON:  Chest x-ray 09/14/2019. FINDINGS: Left-sided chest tube remains in position with tip in the medial aspect of the apex of the left hemithorax. Trace left apical pneumothorax occupying less than 5% of the volume of the left hemithorax. Left basilar opacity which may reflect atelectasis and/or consolidation. No appreciable pleural effusions. No evidence of pulmonary edema. Lucency adjacent to the heart and mediastinal contours, compatible with pneumomediastinum. Heart size is normal. Aortic atherosclerosis. Extensive subcutaneous emphysema throughout the left chest wall tracking in the left cervical region and lower right cervical region. Multiple left-sided rib fractures are again noted, better demonstrated on prior CT the thorax 09/12/2019. IMPRESSION: 1. Support apparatus, as above. 2. Persistent trace left apical pneumothorax and pneumomediastinum. 3. Extensive subcutaneous emphysema in the chest wall and cervical regions. Electronically Signed   By: Trudie Reed M.D.   On: 09/15/2019 07:25   DG CHEST PORT 1 VIEW  Result Date: 09/14/2019 CLINICAL DATA:  Left chest tube in place. Rib fractures secondary to recent fall. EXAM: PORTABLE CHEST 1 VIEW COMPARISON:  Single-view of the chest 09/13/2019 and 09/12/2019. FINDINGS: Left chest tube is unchanged in position. There is new extensive subcutaneous emphysema in the left chest, left neck and over the upper right chest. Minimal apical pneumothorax is noted. Airspace opacities in the left mid and lower lung zones persist. The right lung is expanded and clear. Heart  size is normal. Atherosclerosis. Multiple left rib fractures again seen. IMPRESSION: Minimal left apical pneumothorax with a left chest tube in place.  New extensive subcutaneous emphysema about the left chest with some subcutaneous emphysema also over the right apex. No change in airspace opacities in left mid and lower lung zones which could be due to atelectasis pulmonary contusion. Electronically Signed   By: Drusilla Kanner M.D.   On: 09/14/2019 08:42        Scheduled Meds: . acetaminophen  1,000 mg Oral Q6H   Or  . acetaminophen (TYLENOL) oral liquid 160 mg/5 mL  1,000 mg Oral Q6H  . bisacodyl  10 mg Oral Daily  . folic acid  1 mg Oral Daily  . nicotine  14 mg Transdermal Daily  . senna-docusate  1 tablet Oral QHS  . thiamine  100 mg Oral Daily   Continuous Infusions:   LOS: 2 days    Time spent: 25 minutes    Alberteen Sam, MD [facility] 09/15/2019, 2:31 PM     Please page though AMION or Epic secure chat:  For [employer], Higher education careers adviser

## 2019-09-16 ENCOUNTER — Inpatient Hospital Stay (HOSPITAL_COMMUNITY): Payer: Medicaid Other

## 2019-09-16 LAB — COMPREHENSIVE METABOLIC PANEL
ALT: 12 U/L (ref 0–44)
AST: 33 U/L (ref 15–41)
Albumin: 2.5 g/dL — ABNORMAL LOW (ref 3.5–5.0)
Alkaline Phosphatase: 73 U/L (ref 38–126)
Anion gap: 7 (ref 5–15)
BUN: 5 mg/dL — ABNORMAL LOW (ref 8–23)
CO2: 26 mmol/L (ref 22–32)
Calcium: 8.6 mg/dL — ABNORMAL LOW (ref 8.9–10.3)
Chloride: 98 mmol/L (ref 98–111)
Creatinine, Ser: 0.46 mg/dL (ref 0.44–1.00)
GFR calc Af Amer: >60 mL/min (ref >60)
GFR calc non Af Amer: >60 mL/min (ref >60)
Glucose, Bld: 98 mg/dL (ref 70–99)
Potassium: 4 mmol/L (ref 3.5–5.1)
Sodium: 131 mmol/L — ABNORMAL LOW (ref 135–145)
Total Bilirubin: 0.7 mg/dL (ref 0.3–1.2)
Total Protein: 5.2 g/dL — ABNORMAL LOW (ref 6.5–8.1)

## 2019-09-16 LAB — BODY FLUID CULTURE: Culture: NO GROWTH

## 2019-09-16 LAB — CBC
HCT: 26.3 % — ABNORMAL LOW (ref 36.0–46.0)
Hemoglobin: 8.9 g/dL — ABNORMAL LOW (ref 12.0–15.0)
MCH: 30.2 pg (ref 26.0–34.0)
MCHC: 33.8 g/dL (ref 30.0–36.0)
MCV: 89.2 fL (ref 80.0–100.0)
Platelets: 112 10*3/uL — ABNORMAL LOW (ref 150–400)
RBC: 2.95 MIL/uL — ABNORMAL LOW (ref 3.87–5.11)
RDW: 13.5 % (ref 11.5–15.5)
WBC: 4 10*3/uL (ref 4.0–10.5)
nRBC: 0 % (ref 0.0–0.2)

## 2019-09-16 MED ORDER — SENNOSIDES-DOCUSATE SODIUM 8.6-50 MG PO TABS: 1.0000 | ORAL_TABLET | Freq: Every day | ORAL | 0 refills | Status: DC

## 2019-09-16 MED ORDER — POLYETHYLENE GLYCOL 3350 17 G PO PACK
17.0000 g | PACK | Freq: Every day | ORAL | Status: DC
  Administered 2019-09-16: 10:00:00 17 g via ORAL
  Filled 2019-09-16: qty 1

## 2019-09-16 MED ORDER — ACETAMINOPHEN 500 MG PO TABS: 1000.0000 mg | ORAL_TABLET | Freq: Four times a day (QID) | ORAL | 0 refills | Status: AC

## 2019-09-16 NOTE — Evaluation (Signed)
Physical Therapy Evaluation Patient Details Name: Gloria Santana MRN: [id number] DOB: 02-07-1956 Today's Date: 09/16/2019   History of Present Illness  64 y.o. F with hx depression and alcohol use disorder, possible COPD who presented with 2 weeks chest pain after a fall. found to have hemothorax.  Clinical Impression  Orders received for PT evaluation. Patient demonstrates deficits in functional mobility as indicated below, however, patient and family report close to baseline activity levels. Will have supervision upon initial discharge. No further acute PT needs, VSS throughout session.   Follow Up Recommendations Supervision/Assistance - 24 hour;No PT follow up    Equipment Recommendations  None recommended by PT    Recommendations for Other Services       Precautions / Restrictions Precautions Precautions: Fall Restrictions Weight Bearing Restrictions: No      Mobility  Bed Mobility Overal bed mobility: Modified Independent             General bed mobility comments: increased time and effort, no physical assist  Transfers Overall transfer level: Needs assistance Equipment used: None Transfers: Sit to/from Stand Sit to Stand: Supervision         General transfer comment: no physical assist, supervision for safety  Ambulation/Gait Ambulation/Gait assistance: Supervision Gait Distance (Feet): 30 Feet Assistive device: None   Gait velocity: decreased   General Gait Details: short shuflling steps, limited to in room ambulation  Stairs            Wheelchair Mobility    Modified Rankin (Stroke Patients Only)       Balance Overall balance assessment: History of Falls                                           Pertinent Vitals/Pain Pain Assessment: Faces Pain Score: 4  Faces Pain Scale: Hurts little more Pain Location: rib cage Pain Descriptors / Indicators: Guarding Pain Intervention(s): Monitored during session    Home  Living Family/patient expects to be discharged to:: Private residence Living Arrangements: Spouse/significant other;Children Available Help at Discharge: Family;Available 24 hours/day Type of Home: Apartment Home Access: Level entry     Home Layout: One level Home Equipment: Cane - single point      Prior Function Level of Independence: Independent         Comments: ocassional use of cane     Hand Dominance   Dominant Hand: Right    Extremity/Trunk Assessment   Upper Extremity Assessment Upper Extremity Assessment: Generalized weakness    Lower Extremity Assessment Lower Extremity Assessment: Generalized weakness       Communication   Communication: HOH  Cognition Arousal/Alertness: Awake/alert Behavior During Therapy: Flat affect Overall Cognitive Status: No family/caregiver present to determine baseline cognitive functioning                                        General Comments      Exercises     Assessment/Plan    PT Assessment All further PT needs can be met in the next venue of care  PT Problem List Decreased strength;Decreased activity tolerance;Decreased balance;Decreased mobility;Pain       PT Treatment Interventions      PT Goals (Current goals can be found in the Care Plan section)  Acute Rehab PT Goals Patient Stated Goal:  to go home PT Goal Formulation: All assessment and education complete, DC therapy    Frequency     Barriers to discharge        Co-evaluation               AM-PAC PT "6 Clicks" Mobility  Outcome Measure Help needed turning from your back to your side while in a flat bed without using bedrails?: None Help needed moving from lying on your back to sitting on the side of a flat bed without using bedrails?: None Help needed moving to and from a bed to a chair (including a wheelchair)?: A Little Help needed standing up from a chair using your arms (e.g., wheelchair or bedside chair)?: A  Little Help needed to walk in hospital room?: A Little Help needed climbing 3-5 steps with a railing? : A Little 6 Click Score: 20    End of Session Equipment Utilized During Treatment: Gait belt Activity Tolerance: Patient tolerated treatment well Patient left: in chair;with call bell/phone within reach;with chair alarm set Nurse Communication: Mobility status PT Visit Diagnosis: Pain;History of falling (Z91.81)    Time: 1595-3967 PT Time Calculation (min) (ACUTE ONLY): 17 min   Charges:   PT Evaluation $PT Eval Moderate Complexity: 1 Mod          Alben Deeds, PT DPT  Board Certified Neurologic Specialist Acute Rehabilitation Services Pager [phone] Office [phone]   Duncan Dull 09/16/2019, 10:38 AM

## 2019-09-16 NOTE — Progress Notes (Signed)
      [address]       [name] [id number]             [phone]     Chest xray is stable following CT removal. Can be discharged from thoracic surgery perspective at any time. Sub q air will resorb over time.  Rowe Clack, PA-C

## 2019-09-16 NOTE — Discharge Summary (Signed)
Physician Discharge Summary  Gloria Santana YNW:[id number] DOB: 1955/12/13 DOA: 09/12/2019  PCP: Gloria Fire, MD  Admit date: 09/12/2019 Discharge date: 09/16/2019  Time spent:45 minutes  Recommendations for Outpatient Follow-up:  TCTS office on 4/16 for suture removal Dr. Servando Santana on 4/22 with x-ray  Discharge Diagnoses:  Principal Problem:   Hemothorax Active Problems:   Rib fracture   COPD (chronic obstructive pulmonary disease) ([place])   Hemothorax, left   Discharge Condition: Improved  Diet recommendation: Heart healthy  Filed Weights   09/12/19 1455 09/13/19 0233 09/13/19 0827  Weight: 49 kg 50.9 kg 50.9 kg    History of present illness:  [name] is a 64 y.o. F with hx depression and alcohol use disorder, possible COPD who presented with 2 weeks chest pain after a fall. Patient fell at home between the toilet and the wall about 2 weeks ago.  Since then she has been having progressive left-sided chest pain progressive weakness, shortness of breath and pleuritic pain, which became severe enough that she came to the emergency room. In the ER, radiograph showed rib fractures of ribs 8-12.  CT showed a moderate to large left-sided hemothorax.  Chest tube was placed in the ER with a scant return  Hospital Course:   Hemothorax, left side due to traumatic rib fracture -CT on admission noted large left-sided hemothorax -T CTS was consulted -Underwent VATS with evacuation of hemothorax on 3/31 by Dr. Servando Santana -Clinically stable and improved postop -Chest tube removed today, small residual pneumothorax noted on imaging -She is cleared for discharge per thoracic surgery, follow-up made in 2 weeks for suture removal and thereafter for follow-up with x-ray -Encouraged using incentive spirometer and activity as tolerated as well as smoking cessation  Possible COPD Smoking No evidence of bronchospasm.  Smoking cessation recommended. -Duo nebs as needed -Continue nicotine  supplement  Hyponatremia Asymptomatic, mild, stable.  Normocytic anemia Thrombocytopenia Hemoglobin trending down, but other than expected scant blood in chest tube, no clinical bleeding.  FOBT negative.  Plts stable. -  Recheck CBC in 1 week  Alcohol use disorder No symptoms of withdrawal -Counseled     Procedures: VATS with evacuation of hemothorax by Dr. Servando Santana on 3/31  Consultations: T CTS  Discharge Exam: Vitals:   09/16/19 0757 09/16/19 1150  BP: (!) 141/80 116/87  Pulse:    Resp:  14  Temp: 98.5 F (36.9 C) 97.8 F (36.6 C)  SpO2:  99%    General: Awake alert oriented x3 Cardiovascular: S1-S2, regular rate rhythm Respiratory: Decreased breath sounds in the left  Discharge Instructions   Discharge Instructions    Diet - low sodium heart healthy   Complete by: As directed    Increase activity slowly   Complete by: As directed      Allergies as of 09/16/2019   No Known Allergies     Medication List    STOP taking these medications   ASPIRIN EC PO     TAKE these medications   acetaminophen 500 MG tablet Commonly known as: TYLENOL Take 2 tablets (1,000 mg total) by mouth every 6 (six) hours.   albuterol 108 (90 Base) MCG/ACT inhaler Commonly known as: VENTOLIN HFA Inhale 1-2 puffs into the lungs every 6 (six) hours as needed for wheezing or shortness of breath.   senna-docusate 8.6-50 MG tablet Commonly known as: Senokot-S Take 1 tablet by mouth at bedtime.      No Known Allergies Follow-up Information    Grace Isaac, MD. Daphane Shepherd  on 10/05/2019.   Specialty: Cardiothoracic Surgery Why: PA/LAT CXR to be taken (at [facility] which is in the same building as Dr. Dennie Maizes office) on 04/22 at. 12:45 pm Appointment time is at 1:15 pm Contact information: [address] [place] [address] [phone]        [facility]. Go on 09/29/2019.   Specialty: Cardiothoracic  Surgery Why: Appointment is with nurse only for chest tube suture removal only. Appointment time is at 10:00 am Contact information: [address] [place], [address] [place] [address] [phone]           The results of significant diagnostics from this hospitalization (including imaging, microbiology, ancillary and laboratory) are listed below for reference.    Significant Diagnostic Studies: DG Chest 2 View  Result Date: 09/16/2019 CLINICAL DATA:  Left chest tube removal. EXAM: CHEST - 2 VIEW COMPARISON:  09/16/2019 FINDINGS: Left chest tube removed. Suspect similar small left apical pneumothorax given changes in positioning. Extensive left chest and neck subcutaneous emphysema overlies the chest. Stable hyperinflation and basilar scarring suspected. No enlarging effusion. Stable heart size and vascularity. Trachea midline. IMPRESSION: Interval chest tube removal. Suspect stable tiny left apical pneumothorax. Significant left chest and neck subcutaneous emphysema. No significant interval change or new finding. Electronically Signed   By: Judie Petit.  Shick M.D.   On: 09/16/2019 11:49   DG Ribs Unilateral W/Chest Left  Result Date: 09/12/2019 CLINICAL DATA:  Left axillary rib pain, fell 2 weeks ago EXAM: LEFT RIBS AND CHEST - 3+ VIEW COMPARISON:  01/05/2019 FINDINGS: Frontal and oblique views of the left thoracic cage are obtained. There are minimally displaced left lateral ninth and tenth rib fractures. Small left pleural effusion likely reflects hemothorax. No pneumothorax. Consolidation left lung base likely atelectasis. Cardiac silhouette is unremarkable. IMPRESSION: 1. Displaced left lateral ninth and tenth rib fractures, with small left pleural effusion likely reflecting hemothorax. 2. No evidence of pneumothorax. Electronically Signed   By: Sharlet Salina M.D.   On: 09/12/2019 16:48   CT Chest W Contrast  Result Date: 09/12/2019 CLINICAL DATA:  Abdominal pain. Pain status post  fall 2 weeks ago. Left-sided rib cage pain EXAM: CT CHEST, ABDOMEN, AND PELVIS WITH CONTRAST TECHNIQUE: Multidetector CT imaging of the chest, abdomen and pelvis was performed following the standard protocol during bolus administration of intravenous contrast. CONTRAST:  66mL OMNIPAQUE IOHEXOL 300 MG/ML  SOLN COMPARISON:  CT chest dated 04/13/2013. FINDINGS: CT CHEST FINDINGS Cardiovascular: The heart size is normal. The ascending thoracic aorta is ectatic measuring approximately 3.9 cm in diameter. There is no evidence for dissection. The arch vessels are grossly patent where visualized. The main pulmonary artery is within normal limits. There is no large centrally located pulmonary embolism. Coronary artery calcifications are noted. There are minimal calcifications of the thoracic aorta. Mediastinum/Nodes: --No mediastinal or hilar lymphadenopathy. --No axillary lymphadenopathy. --No supraclavicular lymphadenopathy. --multiple thyroid nodules are noted. The largest thyroid nodule measures up to approximately 1 cm (axial series 2, image 13). No followup recommended (ref: J Am Coll Radiol. 2015 Feb;12(2): 143-50). --The esophagus is unremarkable Lungs/Pleura: There is a large left-sided pleural effusion. The pleural fluid measures approximately 28-30 Hounsfield units consistent with a probable left-sided hemothorax. There is partial collapse of the left lower lobe. There is no pneumothorax. Mild emphysematous changes are noted. There is scattered bilateral micro nodules that appears centrally stable since the patient's prior study in 2014 and as such no further follow-up is  required. Musculoskeletal: There are multiple acute to subacute displaced and nondisplaced left-sided rib fractures. The rib fractures involve the anterior sixth rib on the patient's left in addition to the posterior eighth through twelfth ribs. There are segmental fractures involving the eighth through eleventh ribs. There is a chronic appearing  fracture of the superior sternal body. CT ABDOMEN PELVIS FINDINGS Hepatobiliary: There is severe hepatic steatosis. Cholelithiasis without acute inflammation.There is no biliary ductal dilation. Pancreas: There are multiple calcifications throughout the patient's pancreas consistent with chronic pancreatitis. Spleen: No splenic laceration or hematoma. Adrenals/Urinary Tract: --Adrenal glands: No adrenal hemorrhage. --Right kidney/ureter: No hydronephrosis or perinephric hematoma. --Left kidney/ureter: No hydronephrosis or perinephric hematoma. --Urinary bladder: Unremarkable. Stomach/Bowel: --Stomach/Duodenum: No hiatal hernia or other gastric abnormality. Normal duodenal course and caliber. --Small bowel: No dilatation or inflammation. --Colon: No focal abnormality. --Appendix: Normal. Vascular/Lymphatic: Atherosclerotic calcification is present within the non-aneurysmal abdominal aorta, without hemodynamically significant stenosis. --No retroperitoneal lymphadenopathy. --No mesenteric lymphadenopathy. --No pelvic or inguinal lymphadenopathy. Reproductive: Status post hysterectomy. No adnexal mass. Other: No ascites or free air. There is a fat containing umbilical hernia. Musculoskeletal. There are age-indeterminate compression fractures of the L3 and L4 vertebral bodies with approximately 20% height loss at the L3 level and 25% height loss at the L4 level. Both of these fractures are favored to be acute to subacute. There are 5 non rib-bearing lumbar type vertebral bodies. There is grade 1 anterolisthesis of L4 on L5. IMPRESSION: 1. Large left-sided pleural effusion with partial collapse of the left lower lobe. There is increased density involving this left-sided pleural effusion suggesting that may in fact represent a left-sided hemothorax related to the patient's left-sided rib fractures. 2. Multiple acute to subacute displaced and nondisplaced left-sided rib fractures as detailed above. There are segmental  fractures of the eighth through eleventh ribs on the left. 3. Age-indeterminate compression fractures of the L3 and L4 vertebral bodies. These are favored to be acute to subacute but should be correlated with point tenderness. 4. Hepatic steatosis. 5. Cholelithiasis without acute inflammation. 6. Chronic pancreatitis. Aortic Atherosclerosis (ICD10-I70.0). Electronically Signed   By: Katherine Mantle M.D.   On: 09/12/2019 20:23   CT Abdomen Pelvis W Contrast  Result Date: 09/12/2019 CLINICAL DATA:  Abdominal pain. Pain status post fall 2 weeks ago. Left-sided rib cage pain EXAM: CT CHEST, ABDOMEN, AND PELVIS WITH CONTRAST TECHNIQUE: Multidetector CT imaging of the chest, abdomen and pelvis was performed following the standard protocol during bolus administration of intravenous contrast. CONTRAST:  75mL OMNIPAQUE IOHEXOL 300 MG/ML  SOLN COMPARISON:  CT chest dated 04/13/2013. FINDINGS: CT CHEST FINDINGS Cardiovascular: The heart size is normal. The ascending thoracic aorta is ectatic measuring approximately 3.9 cm in diameter. There is no evidence for dissection. The arch vessels are grossly patent where visualized. The main pulmonary artery is within normal limits. There is no large centrally located pulmonary embolism. Coronary artery calcifications are noted. There are minimal calcifications of the thoracic aorta. Mediastinum/Nodes: --No mediastinal or hilar lymphadenopathy. --No axillary lymphadenopathy. --No supraclavicular lymphadenopathy. --multiple thyroid nodules are noted. The largest thyroid nodule measures up to approximately 1 cm (axial series 2, image 13). No followup recommended (ref: J Am Coll Radiol. 2015 Feb;12(2): 143-50). --The esophagus is unremarkable Lungs/Pleura: There is a large left-sided pleural effusion. The pleural fluid measures approximately 28-30 Hounsfield units consistent with a probable left-sided hemothorax. There is partial collapse of the left lower lobe. There is no  pneumothorax. Mild emphysematous changes are noted. There is scattered bilateral micro  nodules that appears centrally stable since the patient's prior study in 2014 and as such no further follow-up is required. Musculoskeletal: There are multiple acute to subacute displaced and nondisplaced left-sided rib fractures. The rib fractures involve the anterior sixth rib on the patient's left in addition to the posterior eighth through twelfth ribs. There are segmental fractures involving the eighth through eleventh ribs. There is a chronic appearing fracture of the superior sternal body. CT ABDOMEN PELVIS FINDINGS Hepatobiliary: There is severe hepatic steatosis. Cholelithiasis without acute inflammation.There is no biliary ductal dilation. Pancreas: There are multiple calcifications throughout the patient's pancreas consistent with chronic pancreatitis. Spleen: No splenic laceration or hematoma. Adrenals/Urinary Tract: --Adrenal glands: No adrenal hemorrhage. --Right kidney/ureter: No hydronephrosis or perinephric hematoma. --Left kidney/ureter: No hydronephrosis or perinephric hematoma. --Urinary bladder: Unremarkable. Stomach/Bowel: --Stomach/Duodenum: No hiatal hernia or other gastric abnormality. Normal duodenal course and caliber. --Small bowel: No dilatation or inflammation. --Colon: No focal abnormality. --Appendix: Normal. Vascular/Lymphatic: Atherosclerotic calcification is present within the non-aneurysmal abdominal aorta, without hemodynamically significant stenosis. --No retroperitoneal lymphadenopathy. --No mesenteric lymphadenopathy. --No pelvic or inguinal lymphadenopathy. Reproductive: Status post hysterectomy. No adnexal mass. Other: No ascites or free air. There is a fat containing umbilical hernia. Musculoskeletal. There are age-indeterminate compression fractures of the L3 and L4 vertebral bodies with approximately 20% height loss at the L3 level and 25% height loss at the L4 level. Both of these  fractures are favored to be acute to subacute. There are 5 non rib-bearing lumbar type vertebral bodies. There is grade 1 anterolisthesis of L4 on L5. IMPRESSION: 1. Large left-sided pleural effusion with partial collapse of the left lower lobe. There is increased density involving this left-sided pleural effusion suggesting that may in fact represent a left-sided hemothorax related to the patient's left-sided rib fractures. 2. Multiple acute to subacute displaced and nondisplaced left-sided rib fractures as detailed above. There are segmental fractures of the eighth through eleventh ribs on the left. 3. Age-indeterminate compression fractures of the L3 and L4 vertebral bodies. These are favored to be acute to subacute but should be correlated with point tenderness. 4. Hepatic steatosis. 5. Cholelithiasis without acute inflammation. 6. Chronic pancreatitis. Aortic Atherosclerosis (ICD10-I70.0). Electronically Signed   By: Katherine Mantlehristopher  Green M.D.   On: 09/12/2019 20:23   DG Chest Port 1 View  Result Date: 09/16/2019 CLINICAL DATA:  Hemothorax.  GI bleeding and chest tube EXAM: PORTABLE CHEST 1 VIEW COMPARISON:  Yesterday FINDINGS: Trace left apical pneumothorax. Left-sided chest tube in place with tip overlapping the apex. Extensive left chest wall and lower neck emphysema. Hyperinflated lungs. Left-sided atelectasis. IMPRESSION: 1. Stable trace left apical pneumothorax. 2. Increased chest wall emphysema. Electronically Signed   By: Marnee SpringJonathon  Watts M.D.   On: 09/16/2019 08:04   DG CHEST PORT 1 VIEW  Result Date: 09/15/2019 CLINICAL DATA:  64 year old female with history of pneumothorax. EXAM: PORTABLE CHEST 1 VIEW COMPARISON:  Chest x-ray 09/14/2019. FINDINGS: Left-sided chest tube remains in position with tip in the medial aspect of the apex of the left hemithorax. Trace left apical pneumothorax occupying less than 5% of the volume of the left hemithorax. Left basilar opacity which may reflect atelectasis  and/or consolidation. No appreciable pleural effusions. No evidence of pulmonary edema. Lucency adjacent to the heart and mediastinal contours, compatible with pneumomediastinum. Heart size is normal. Aortic atherosclerosis. Extensive subcutaneous emphysema throughout the left chest wall tracking in the left cervical region and lower right cervical region. Multiple left-sided rib fractures are again noted, better demonstrated on  prior CT the thorax 09/12/2019. IMPRESSION: 1. Support apparatus, as above. 2. Persistent trace left apical pneumothorax and pneumomediastinum. 3. Extensive subcutaneous emphysema in the chest wall and cervical regions. Electronically Signed   By: Trudie Reed M.D.   On: 09/15/2019 07:25   DG CHEST PORT 1 VIEW  Result Date: 09/14/2019 CLINICAL DATA:  Left chest tube in place. Rib fractures secondary to recent fall. EXAM: PORTABLE CHEST 1 VIEW COMPARISON:  Single-view of the chest 09/13/2019 and 09/12/2019. FINDINGS: Left chest tube is unchanged in position. There is new extensive subcutaneous emphysema in the left chest, left neck and over the upper right chest. Minimal apical pneumothorax is noted. Airspace opacities in the left mid and lower lung zones persist. The right lung is expanded and clear. Heart size is normal. Atherosclerosis. Multiple left rib fractures again seen. IMPRESSION: Minimal left apical pneumothorax with a left chest tube in place. New extensive subcutaneous emphysema about the left chest with some subcutaneous emphysema also over the right apex. No change in airspace opacities in left mid and lower lung zones which could be due to atelectasis pulmonary contusion. Electronically Signed   By: Drusilla Kanner M.D.   On: 09/14/2019 08:42   DG Chest Port 1 View  Result Date: 09/13/2019 CLINICAL DATA:  Postop VATS, chest tube EXAM: PORTABLE CHEST 1 VIEW COMPARISON:  09/12/2019 FINDINGS: Interval removal of left-sided pigtail chest tube and placement of  large-bore chest tube, tip about the left apex. Near complete resolution of previously seen left pleural effusion. No significant pneumothorax. Heart and mediastinum are unremarkable. Left-sided rib fractures are not well appreciated. IMPRESSION: Interval removal of left-sided pigtail chest tube and placement of large-bore chest tube, tip about the left apex. Near complete resolution of previously seen left pleural effusion. No significant pneumothorax. Electronically Signed   By: Lauralyn Primes M.D.   On: 09/13/2019 11:56   DG Chest Portable 1 View  Result Date: 09/12/2019 CLINICAL DATA:  Status post chest tube placement EXAM: PORTABLE CHEST 1 VIEW COMPARISON:  Films from earlier in the same day. FINDINGS: Cardiac shadow is stable in appearance. Left-sided pleural effusion is again identified. Left chest tube is now noted in place. No pneumothorax is seen. Right lung remains clear. Cardiac shadow is stable. Aortic calcifications are noted. Previously seen left rib fractures are again noted. IMPRESSION: Interval chest tube placement. Persistent effusion is noted on the left. Left rib fractures are seen without pneumothorax. Electronically Signed   By: Alcide Clever M.D.   On: 09/12/2019 23:05    Microbiology: Recent Results (from the past 240 hour(s))  Respiratory Panel by RT PCR (Flu A&B, Covid) - Nasopharyngeal Swab     Status: None   Collection Time: 09/12/19 11:39 PM   Specimen: Nasopharyngeal Swab  Result Value Ref Range Status   SARS Coronavirus 2 by RT PCR NEGATIVE NEGATIVE Final    Comment: (NOTE) SARS-CoV-2 target nucleic acids are NOT DETECTED. The SARS-CoV-2 RNA is generally detectable in upper respiratoy specimens during the acute phase of infection. The lowest concentration of SARS-CoV-2 viral copies this assay can detect is 131 copies/mL. A negative result does not preclude SARS-Cov-2 infection and should not be used as the sole basis for treatment or other patient management  decisions. A negative result may occur with  improper specimen collection/handling, submission of specimen other than nasopharyngeal swab, presence of viral mutation(s) within the areas targeted by this assay, and inadequate number of viral copies (<131 copies/mL). A negative result must be combined with  clinical observations, patient history, and epidemiological information. The expected result is Negative. Fact Sheet for Patients:  https://www.moore.com/ Fact Sheet for Healthcare Providers:  https://www.young.biz/ This test is not yet ap proved or cleared by the [place] FDA and  has been authorized for detection and/or diagnosis of SARS-CoV-2 by FDA under an Emergency Use Authorization (EUA). This EUA will remain  in effect (meaning this test can be used) for the duration of the COVID-19 declaration under Section 564(b)(1) of the Act, 21 U.S.C. section 360bbb-3(b)(1), unless the authorization is terminated or revoked sooner.    Influenza A by PCR NEGATIVE NEGATIVE Final   Influenza B by PCR NEGATIVE NEGATIVE Final    Comment: (NOTE) The Xpert Xpress SARS-CoV-2/FLU/RSV assay is intended as an aid in  the diagnosis of influenza from Nasopharyngeal swab specimens and  should not be used as a sole basis for treatment. Nasal washings and  aspirates are unacceptable for Xpert Xpress SARS-CoV-2/FLU/RSV  testing. Fact Sheet for Patients: https://www.moore.com/ Fact Sheet for Healthcare Providers: https://www.young.biz/ This test is not yet approved or cleared by the [place] FDA and  has been authorized for detection and/or diagnosis of SARS-CoV-2 by  FDA under an Emergency Use Authorization (EUA). This EUA will remain  in effect (meaning this test can be used) for the duration of the  Covid-19 declaration under Section 564(b)(1) of the Act, 21  U.S.C. section 360bbb-3(b)(1), unless the authorization  is  terminated or revoked. Performed at [facility], [address], [place], [place] [address]   MRSA PCR Screening     Status: None   Collection Time: 09/13/19  7:57 AM   Specimen: Nasal Mucosa; Nasopharyngeal  Result Value Ref Range Status   MRSA by PCR NEGATIVE NEGATIVE Final    Comment:        The GeneXpert MRSA Assay (FDA approved for NASAL specimens only), is one component of a comprehensive MRSA colonization surveillance program. It is not intended to diagnose MRSA infection nor to guide or monitor treatment for MRSA infections. Performed at [facility], [address] [place], [place] [address]   Anaerobic culture     Status: None (Preliminary result)   Collection Time: 09/13/19  9:32 AM   Specimen: Other Source; Body Fluid  Result Value Ref Range Status   Specimen Description BRONCHIAL WASHINGS  Final   Special Requests   Final    NONE Performed at [facility], [address] [place], [place] [address]    Culture   Final    NO ANAEROBES ISOLATED; CULTURE IN PROGRESS FOR 5 DAYS   Report Status PENDING  Incomplete  Body fluid culture     Status: None   Collection Time: 09/13/19  9:32 AM   Specimen: Other Source; Body Fluid  Result Value Ref Range Status   Specimen Description BRONCHIAL WASHINGS  Final   Special Requests NONE  Final   Gram Stain   Final    FEW WBC PRESENT, PREDOMINANTLY PMN NO ORGANISMS SEEN    Culture   Final    RARE Consistent with normal respiratory flora. CORRECTED RESULTS PREVIOUSLY REPORTED AS: RARE HAEMOPHILUS INFLUENZAE Results Called to: Kym Groom, RN AT 1605 ON 09/15/19 BY C. JESSUP, MT. Performed at [facility], [address], [place], [place] [address]    Report Status 09/15/2019 FINAL  Final  Fungus Culture With Stain     Status: None (Preliminary result)   Collection Time: 09/13/19  9:32 AM   Specimen:  Other Source; Body Fluid  Result Value Ref Range Status   Fungus Stain Final report  Final     Comment: (NOTE) Performed At: [facility] [address] [place], [place] [id number] Jolene Schimke MD VO:[id number]    Fungus (Mycology) Culture PENDING  Incomplete   Fungal Source BRONCHIAL WASHINGS  Final    Comment: Performed at [facility], [address] [place], [place] [address]  Acid Fast Smear (AFB)     Status: None   Collection Time: 09/13/19  9:32 AM   Specimen: Other Source; Body Fluid  Result Value Ref Range Status   AFB Specimen Processing Concentration  Final   Acid Fast Smear Negative  Final    Comment: (NOTE) Performed At: [facility] [address] [place], [place] [id number] Jolene Schimke MD YB:[id number]    Source (AFB) BRONCHIAL WASHINGS  Final    Comment: Performed at [facility], [address], [place], [place] [address]  Fungus Culture Result     Status: None   Collection Time: 09/13/19  9:32 AM  Result Value Ref Range Status   Result 1 Comment  Final    Comment: (NOTE) KOH/Calcofluor preparation:  no fungus observed. Performed At: [facility] [address] [place], [place] [id number] Jolene Schimke MD EL:[id number]   Body fluid culture     Status: None   Collection Time: 09/13/19 10:22 AM   Specimen: Other Source; Body Fluid  Result Value Ref Range Status   Specimen Description THORACENTESIS  Final   Special Requests LEFT HEMOTHORAX  Final   Gram Stain   Final    FEW WBC PRESENT, PREDOMINANTLY PMN NO ORGANISMS SEEN    Culture   Final    NO GROWTH 3 DAYS Performed at [facility], [address] [place], [place] [address]    Report Status 09/16/2019 FINAL  Final     Labs: Basic Metabolic Panel: Recent Labs  Lab 09/12/19 1605 09/12/19 1605 09/13/19 1020 09/13/19 1255 09/14/19 0324 09/15/19 0243 09/16/19 0231  NA 126*   < > 131* 132* 129* 130* 131*  K 3.6   < > 2.8* 3.4* 3.6 3.5 4.0  CL 88*  --   --  98 95* 98 98  CO2 26  --   --  21* 23 27 26   GLUCOSE 96  --   --  113* 83 100* 98   BUN 7*  --   --  <5* <5* 5* 5*  CREATININE 0.49  --   --  0.55 0.61 0.56 0.46  CALCIUM 9.5  --   --  8.7* 8.7* 8.4* 8.6*  MG  --   --   --   --  1.3*  --   --    < > = values in this interval not displayed.   Liver Function Tests: Recent Labs  Lab 09/12/19 1605 09/13/19 1255 09/14/19 0324 09/15/19 0243 09/16/19 0231  AST 65* 51* 45* 34 33  ALT 21 17 17 13 12   ALKPHOS 132* 90 85 88 73  BILITOT 1.9* 1.6* 1.1 0.6 0.7  PROT 7.6 5.9* 5.6* 5.2* 5.2*  ALBUMIN 3.8 3.2* 2.9* 2.6* 2.5*   Recent Labs  Lab 09/12/19 1605  LIPASE 40   No results for input(s): AMMONIA in the last 168 hours. CBC: Recent Labs  Lab 09/12/19 1605 09/12/19 1605 09/13/19 1020 09/13/19 1744 09/14/19 0324 09/15/19 0243 09/16/19 0231  WBC 4.4  --   --  5.2 6.0 4.4  4.0  NEUTROABS 2.7  --   --   --   --   --   --   HGB 11.4*   < > 10.2* 11.5* 10.0* 9.6* 8.9*  HCT 34.1*   < > 30.0* 34.0* 28.8* 27.7* 26.3*  MCV 90.5  --   --  89.0 88.3 88.2 89.2  PLT 116*  --   --  121* 109* 101* 112*   < > = values in this interval not displayed.   Cardiac Enzymes: No results for input(s): CKTOTAL, CKMB, CKMBINDEX, TROPONINI in the last 168 hours. BNP: BNP (last 3 results) No results for input(s): BNP in the last 8760 hours.  ProBNP (last 3 results) No results for input(s): PROBNP in the last 8760 hours.  CBG: Recent Labs  Lab 09/13/19 0759 09/13/19 1111 09/13/19 1633 09/13/19 2343 09/14/19 0545  GLUCAP 79 102* 142* 145* 117*       Signed:  Zannie Cove MD.  [facility] 09/16/2019, 3:16 PM

## 2019-09-16 NOTE — Progress Notes (Addendum)
[address]       [place] [address]             [phone]      3 Days Post-Op Procedure(s) (LRB): VIDEO ASSISTED THORACOSCOPY (VATS) EVACUATION of HEMOTHORAX WITH INTERCOSTAL NERVE BLOCK (Left) Video Bronchoscopy (N/A) Subjective: Feels ok, not short of breath  Objective: Vital signs in last 24 hours: Temp:  [97.7 F (36.5 C)-98.6 F (37 C)] 98.4 F (36.9 C) (04/03 0323) Pulse Rate:  [97-115] 97 (04/02 1036) Cardiac Rhythm: Sinus tachycardia (04/02 0711) Resp:  [13-17] 16 (04/03 0323) BP: (96-145)/(65-95) 126/83 (04/03 0323) SpO2:  [98 %-100 %] 98 % (04/03 0323)  Hemodynamic parameters for last 24 hours:    Intake/Output from previous day: 04/02 0701 - 04/03 0700 In: 720 [P.O.:720] Out: 50 [Chest Tube:50] Intake/Output this shift: No intake/output data recorded.  General appearance: alert, cooperative and no distress Heart: regular rate and rhythm Lungs: clear to auscultation bilaterally and + sq air on left Abdomen: soft, nontender Extremities: no calf tenderness Wound: incis dressing CDI  Lab Results: Recent Labs    09/15/19 0243 09/16/19 0231  WBC 4.4 4.0  HGB 9.6* 8.9*  HCT 27.7* 26.3*  PLT 101* 112*   BMET:  Recent Labs    09/15/19 0243 09/16/19 0231  NA 130* 131*  K 3.5 4.0  CL 98 98  CO2 27 26  GLUCOSE 100* 98  BUN 5* 5*  CREATININE 0.56 0.46  CALCIUM 8.4* 8.6*    PT/INR: No results for input(s): LABPROT, INR in the last 72 hours. ABG    Component Value Date/Time   PHART 7.474 (H) 09/14/2019 0314   HCO3 24.4 09/14/2019 0314   TCO2 26 09/13/2019 1020   O2SAT 98.5 09/14/2019 0314   CBG (last 3)  Recent Labs    09/13/19 1633 09/13/19 2343 09/14/19 0545  GLUCAP 142* 145* 117*    Meds Scheduled Meds: . acetaminophen  1,000 mg Oral Q6H   Or  . acetaminophen (TYLENOL) oral liquid 160 mg/5 mL  1,000 mg Oral Q6H  . bisacodyl  10 mg Oral Daily  . folic acid  1 mg Oral Daily  . nicotine  14 mg  Transdermal Daily  . senna-docusate  1 tablet Oral QHS  . thiamine  100 mg Oral Daily   Continuous Infusions: PRN Meds:.albuterol, ondansetron (ZOFRAN) IV, oxyCODONE, pantoprazole, traMADol  Xrays DG CHEST PORT 1 VIEW  Result Date: 09/15/2019 CLINICAL DATA:  64 year old female with history of pneumothorax. EXAM: PORTABLE CHEST 1 VIEW COMPARISON:  Chest x-ray 09/14/2019. FINDINGS: Left-sided chest tube remains in position with tip in the medial aspect of the apex of the left hemithorax. Trace left apical pneumothorax occupying less than 5% of the volume of the left hemithorax. Left basilar opacity which may reflect atelectasis and/or consolidation. No appreciable pleural effusions. No evidence of pulmonary edema. Lucency adjacent to the heart and mediastinal contours, compatible with pneumomediastinum. Heart size is normal. Aortic atherosclerosis. Extensive subcutaneous emphysema throughout the left chest wall tracking in the left cervical region and lower right cervical region. Multiple left-sided rib fractures are again noted, better demonstrated on prior CT the thorax 09/12/2019. IMPRESSION: 1. Support apparatus, as above. 2. Persistent trace left apical pneumothorax and pneumomediastinum. 3. Extensive subcutaneous emphysema in the chest wall and cervical regions. Electronically Signed   By: Trudie Reed M.D.   On: 09/15/2019 07:25   DG CHEST PORT 1 VIEW  Result Date: 09/14/2019 CLINICAL DATA:  Left  chest tube in place. Rib fractures secondary to recent fall. EXAM: PORTABLE CHEST 1 VIEW COMPARISON:  Single-view of the chest 09/13/2019 and 09/12/2019. FINDINGS: Left chest tube is unchanged in position. There is new extensive subcutaneous emphysema in the left chest, left neck and over the upper right chest. Minimal apical pneumothorax is noted. Airspace opacities in the left mid and lower lung zones persist. The right lung is expanded and clear. Heart size is normal. Atherosclerosis. Multiple left  rib fractures again seen. IMPRESSION: Minimal left apical pneumothorax with a left chest tube in place. New extensive subcutaneous emphysema about the left chest with some subcutaneous emphysema also over the right apex. No change in airspace opacities in left mid and lower lung zones which could be due to atelectasis pulmonary contusion. Electronically Signed   By: Drusilla Kanner M.D.   On: 09/14/2019 08:42   Results for orders placed or performed during the hospital encounter of 09/12/19  Respiratory Panel by RT PCR (Flu A&B, Covid) - Nasopharyngeal Swab     Status: None   Collection Time: 09/12/19 11:39 PM   Specimen: Nasopharyngeal Swab  Result Value Ref Range Status   SARS Coronavirus 2 by RT PCR NEGATIVE NEGATIVE Final    Comment: (NOTE) SARS-CoV-2 target nucleic acids are NOT DETECTED. The SARS-CoV-2 RNA is generally detectable in upper respiratoy specimens during the acute phase of infection. The lowest concentration of SARS-CoV-2 viral copies this assay can detect is 131 copies/mL. A negative result does not preclude SARS-Cov-2 infection and should not be used as the sole basis for treatment or other patient management decisions. A negative result may occur with  improper specimen collection/handling, submission of specimen other than nasopharyngeal swab, presence of viral mutation(s) within the areas targeted by this assay, and inadequate number of viral copies (<131 copies/mL). A negative result must be combined with clinical observations, patient history, and epidemiological information. The expected result is Negative. Fact Sheet for Patients:  https://www.moore.com/ Fact Sheet for Healthcare Providers:  https://www.young.biz/ This test is not yet ap proved or cleared by the [place] FDA and  has been authorized for detection and/or diagnosis of SARS-CoV-2 by FDA under an Emergency Use Authorization (EUA). This EUA will remain  in  effect (meaning this test can be used) for the duration of the COVID-19 declaration under Section 564(b)(1) of the Act, 21 U.S.C. section 360bbb-3(b)(1), unless the authorization is terminated or revoked sooner.    Influenza A by PCR NEGATIVE NEGATIVE Final   Influenza B by PCR NEGATIVE NEGATIVE Final    Comment: (NOTE) The Xpert Xpress SARS-CoV-2/FLU/RSV assay is intended as an aid in  the diagnosis of influenza from Nasopharyngeal swab specimens and  should not be used as a sole basis for treatment. Nasal washings and  aspirates are unacceptable for Xpert Xpress SARS-CoV-2/FLU/RSV  testing. Fact Sheet for Patients: https://www.moore.com/ Fact Sheet for Healthcare Providers: https://www.young.biz/ This test is not yet approved or cleared by the [place] FDA and  has been authorized for detection and/or diagnosis of SARS-CoV-2 by  FDA under an Emergency Use Authorization (EUA). This EUA will remain  in effect (meaning this test can be used) for the duration of the  Covid-19 declaration under Section 564(b)(1) of the Act, 21  U.S.C. section 360bbb-3(b)(1), unless the authorization is  terminated or revoked. Performed at [facility], [address], [place], [place] [address]   MRSA PCR Screening     Status: None   Collection Time: 09/13/19  7:57 AM  Specimen: Nasal Mucosa; Nasopharyngeal  Result Value Ref Range Status   MRSA by PCR NEGATIVE NEGATIVE Final    Comment:        The GeneXpert MRSA Assay (FDA approved for NASAL specimens only), is one component of a comprehensive MRSA colonization surveillance program. It is not intended to diagnose MRSA infection nor to guide or monitor treatment for MRSA infections. Performed at [facility], [address] [place], [place] [address]   Anaerobic culture     Status: None (Preliminary result)   Collection Time: 09/13/19  9:32 AM   Specimen: Other Source; Body Fluid  Result  Value Ref Range Status   Specimen Description BRONCHIAL WASHINGS  Final   Special Requests   Final    NONE Performed at [facility], [address], [place], [place] [address]    Culture   Final    NO ANAEROBES ISOLATED; CULTURE IN PROGRESS FOR 5 DAYS   Report Status PENDING  Incomplete  Body fluid culture     Status: None   Collection Time: 09/13/19  9:32 AM   Specimen: Other Source; Body Fluid  Result Value Ref Range Status   Specimen Description BRONCHIAL WASHINGS  Final   Special Requests NONE  Final   Gram Stain   Final    FEW WBC PRESENT, PREDOMINANTLY PMN NO ORGANISMS SEEN    Culture   Final    RARE Consistent with normal respiratory flora. CORRECTED RESULTS PREVIOUSLY REPORTED AS: RARE HAEMOPHILUS INFLUENZAE Results Called to: Kym Groom, RN AT 1605 ON 09/15/19 BY C. JESSUP, MT. Performed at [facility], [address] [place], [place] [address]    Report Status 09/15/2019 FINAL  Final  Acid Fast Smear (AFB)     Status: None   Collection Time: 09/13/19  9:32 AM   Specimen: Other Source; Body Fluid  Result Value Ref Range Status   AFB Specimen Processing Concentration  Final   Acid Fast Smear Negative  Final    Comment: (NOTE) Performed At: [facility] [address] [place], [place] [id number] Jolene Schimke MD XY:[id number]    Source (AFB) BRONCHIAL WASHINGS  Final    Comment: Performed at [facility], [address] [place], [place] [address]  Body fluid culture     Status: None (Preliminary result)   Collection Time: 09/13/19 10:22 AM   Specimen: Other Source; Body Fluid  Result Value Ref Range Status   Specimen Description THORACENTESIS  Final   Special Requests LEFT HEMOTHORAX  Final   Gram Stain   Final    FEW WBC PRESENT, PREDOMINANTLY PMN NO ORGANISMS SEEN    Culture   Final    NO GROWTH 2 DAYS Performed at [facility], [address], [place], [place] [address]    Report Status PENDING  Incomplete     Assessment/Plan: S/P Procedure(s) (LRB): VIDEO ASSISTED THORACOSCOPY (VATS) EVACUATION of HEMOTHORAX WITH INTERCOSTAL NERVE BLOCK (Left) Video Bronchoscopy (N/A)  1 conts to do well 2 stable Vital signs with some sinus tachy 3 sats good on RA 4 CXR stable without def pntx 5 CT no air leak, only 50 cc drainage 6 H/H slightly down further 7 will d/c chest tube this am and get repeat film 2 hours after removal. If no signigicant change can be d/c's from CT surgery perspective when hospitalist is also satisfied with clinical condition stability   LOS: 3 days    Rowe Clack PA-C Pager [phone]  [phone] 09/16/2019 Patient seen and examined. Plan as outlined above  Remo Lipps C. Roxan Hockey, MD [facility] [phone]

## 2019-09-17 LAB — TYPE AND SCREEN
ABO/RH(D): O POS
Antibody Screen: NEGATIVE
Unit division: 0
Unit division: 0

## 2019-09-17 LAB — BPAM RBC
Blood Product Expiration Date: 202104292359
Blood Product Expiration Date: 202105012359
ISSUE DATE / TIME: 202103311010
ISSUE DATE / TIME: 202103311010
Unit Type and Rh: 5100
Unit Type and Rh: 5100

## 2019-09-18 LAB — ANAEROBIC CULTURE

## 2019-09-18 NOTE — Addendum Note (Signed)
Addendum  created 09/18/19 1436 by Kipp Brood, MD   Intraprocedure Staff edited

## 2019-09-29 ENCOUNTER — Other Ambulatory Visit: Payer: Self-pay

## 2019-09-29 ENCOUNTER — Ambulatory Visit (INDEPENDENT_AMBULATORY_CARE_PROVIDER_SITE_OTHER): Payer: Self-pay | Admitting: *Deleted

## 2019-09-29 VITALS — HR 97

## 2019-09-29 DIAGNOSIS — Z09 Encounter for follow-up examination after completed treatment for conditions other than malignant neoplasm: Secondary | ICD-10-CM

## 2019-09-29 DIAGNOSIS — J942 Hemothorax: Secondary | ICD-10-CM

## 2019-09-29 DIAGNOSIS — Z4802 Encounter for removal of sutures: Secondary | ICD-10-CM

## 2019-09-29 NOTE — Progress Notes (Signed)
Gloria Santana returns s/p LVATS, DRAINAGE OF HEMOTHORAX on 09/13/19.  She is managing her pain with Tylenol.  Diet is fair and bowels are being managed with senokot.  Two remaining chest tube sutures were easily removed.  All incisions are very well healed.  She will return as scheduled with a CXR.

## 2019-10-03 ENCOUNTER — Other Ambulatory Visit: Payer: Self-pay | Admitting: Cardiothoracic Surgery

## 2019-10-03 DIAGNOSIS — J942 Hemothorax: Secondary | ICD-10-CM

## 2019-10-05 ENCOUNTER — Encounter: Payer: Self-pay | Admitting: Cardiothoracic Surgery

## 2019-10-05 NOTE — Progress Notes (Deleted)
Primary Care Physician:  Avon Gully, MD  Primary Gastroenterologist:  Jonette Eva, MD   No chief complaint on file.   HPI:  Gloria Santana is a 64 y.o. female here for consideration of colonoscopy. Last seen in 06/2018. Was scheduled for colonoscopy with possible EGD but rescheduled twice last year due to covid and illness.   H/o etoh abuse. U/s with elastography in 2020 with Metavir fibrosis score of F0/F1. CT Chest/Abd/pelvis in 08/2019 after fall showed multiple thyroid nodules, large left-sided pleural effusion c/w probable left-sided hemothorax with partially collapsed left lower lobe. Multiple acute to subacute displaced and nondisplaced left-sided rib fractures, severe hepatic steatosis, gallstones, multiple pancreatic calcifications, age indeterminate compression fractures of L3 and L4 vertebral bodies. Patient required left video-assisted thoracoscopy with evacuation of hemothorax on 09/15/19.   Current Outpatient Medications  Medication Sig Dispense Refill  . acetaminophen (TYLENOL) 500 MG tablet Take 2 tablets (1,000 mg total) by mouth every 6 (six) hours. 30 tablet 0  . albuterol (VENTOLIN HFA) 108 (90 Base) MCG/ACT inhaler Inhale 1-2 puffs into the lungs every 6 (six) hours as needed for wheezing or shortness of breath.    . senna-docusate (SENOKOT-S) 8.6-50 MG tablet Take 1 tablet by mouth at bedtime. 10 tablet 0   No current facility-administered medications for this visit.    Allergies as of 10/06/2019  . (No Known Allergies)    Past Medical History:  Diagnosis Date  . Alcohol dependence (HCC)   . Anemia   . COPD (chronic obstructive pulmonary disease) (HCC)   . Diabetes mellitus without complication (HCC)    Pt states she is not diabetic  . Hypertension   . Poor circulation     Past Surgical History:  Procedure Laterality Date  . ABDOMINAL HYSTERECTOMY    . VIDEO ASSISTED THORACOSCOPY (VATS)/EMPYEMA Left 09/13/2019   Procedure: VIDEO ASSISTED THORACOSCOPY  (VATS) EVACUATION of HEMOTHORAX WITH INTERCOSTAL NERVE BLOCK;  Surgeon: Delight Ovens, MD;  Location: [facility] OR;  Service: Thoracic;  Laterality: Left;  Marland Kitchen VIDEO BRONCHOSCOPY N/A 09/13/2019   Procedure: Video Bronchoscopy;  Surgeon: Delight Ovens, MD;  Location: [facility] OR;  Service: Thoracic;  Laterality: N/A;    Family History  Problem Relation Age of Onset  . Diabetes Mother   . Colon cancer Neg Hx   . Colon polyps Neg Hx     Social History   Socioeconomic History  . Marital status: Legally Separated    Spouse name: Not on file  . Number of children: Not on file  . Years of education: Not on file  . Highest education level: Not on file  Occupational History  . Not on file  Tobacco Use  . Smoking status: Current Every Day Smoker    Packs/day: 2.00    Years: 35.00    Pack years: 70.00    Types: Cigarettes  . Smokeless tobacco: Never Used  Substance and Sexual Activity  . Alcohol use: Yes    Alcohol/week: 14.0 standard drinks    Types: 14 Cans of beer per week  . Drug use: No  . Sexual activity: Yes    Birth control/protection: Surgical    Comment: Hysterectomy  Other Topics Concern  . Not on file  Social History Narrative  . Not on file   Social Determinants of Health   Financial Resource Strain:   . Difficulty of Paying Living Expenses:   Food Insecurity:   . Worried About Programme researcher, broadcasting/film/video in the Last Year:   .  Ran Out of Food in the Last Year:   Transportation Needs:   . Freight forwarder (Medical):   Marland Kitchen Lack of Transportation (Non-Medical):   Physical Activity:   . Days of Exercise per Week:   . Minutes of Exercise per Session:   Stress:   . Feeling of Stress :   Social Connections:   . Frequency of Communication with Friends and Family:   . Frequency of Social Gatherings with Friends and Family:   . Attends Religious Services:   . Active Member of Clubs or Organizations:   . Attends Banker Meetings:   Marland Kitchen Marital Status:    Intimate Partner Violence:   . Fear of Current or Ex-Partner:   . Emotionally Abused:   Marland Kitchen Physically Abused:   . Sexually Abused:       ROS:  General: Negative for anorexia, weight loss, fever, chills, fatigue, weakness. Eyes: Negative for vision changes.  ENT: Negative for hoarseness, difficulty swallowing , nasal congestion. CV: Negative for chest pain, angina, palpitations, dyspnea on exertion, peripheral edema.  Respiratory: Negative for dyspnea at rest, dyspnea on exertion, cough, sputum, wheezing.  GI: See history of present illness. GU:  Negative for dysuria, hematuria, urinary incontinence, urinary frequency, nocturnal urination.  MS: Negative for joint pain, low back pain.  Derm: Negative for rash or itching.  Neuro: Negative for weakness, abnormal sensation, seizure, frequent headaches, memory loss, confusion.  Psych: Negative for anxiety, depression, suicidal ideation, hallucinations.  Endo: Negative for unusual weight change.  Heme: Negative for bruising or bleeding. Allergy: Negative for rash or hives.    Physical Examination:  LMP  (LMP Unknown)    General: Well-nourished, well-developed in no acute distress.  Head: Normocephalic, atraumatic.   Eyes: Conjunctiva pink, no icterus. Mouth: Oropharyngeal mucosa moist and pink , no lesions erythema or exudate. Neck: Supple without thyromegaly, masses, or lymphadenopathy.  Lungs: Clear to auscultation bilaterally.  Heart: Regular rate and rhythm, no murmurs rubs or gallops.  Abdomen: Bowel sounds are normal, nontender, nondistended, no hepatosplenomegaly or masses, no abdominal bruits or    hernia , no rebound or guarding.   Rectal: *** Extremities: No lower extremity edema. No clubbing or deformities.  Neuro: Alert and oriented x 4 , grossly normal neurologically.  Skin: Warm and dry, no rash or jaundice.   Psych: Alert and cooperative, normal mood and affect.  Labs: Lab Results  Component Value Date    CREATININE 0.46 09/16/2019   BUN 5 (L) 09/16/2019   NA 131 (L) 09/16/2019   K 4.0 09/16/2019   CL 98 09/16/2019   CO2 26 09/16/2019   Lab Results  Component Value Date   ALT 12 09/16/2019   AST 33 09/16/2019   ALKPHOS 73 09/16/2019   BILITOT 0.7 09/16/2019   Lab Results  Component Value Date   WBC 4.0 09/16/2019   HGB 8.9 (L) 09/16/2019   HCT 26.3 (L) 09/16/2019   MCV 89.2 09/16/2019   PLT 112 (L) 09/16/2019   Lab Results  Component Value Date   INR 1.0 09/05/2018   INR 1.03 09/12/2016   INR 1.23 11/26/2014    Lab Results  Component Value Date   IRON 12 (L) 09/15/2019   TIBC 157 (L) 09/15/2019   FERRITIN 400 (H) 09/15/2019   Lab Results  Component Value Date   VITAMINB12 500 09/15/2019   No results found for: FOLATE  Lab Results  Component Value Date   LIPASE 40 09/12/2019     Imaging  Studies: DG Chest 2 View  Result Date: 09/16/2019 CLINICAL DATA:  Left chest tube removal. EXAM: CHEST - 2 VIEW COMPARISON:  09/16/2019 FINDINGS: Left chest tube removed. Suspect similar small left apical pneumothorax given changes in positioning. Extensive left chest and neck subcutaneous emphysema overlies the chest. Stable hyperinflation and basilar scarring suspected. No enlarging effusion. Stable heart size and vascularity. Trachea midline. IMPRESSION: Interval chest tube removal. Suspect stable tiny left apical pneumothorax. Significant left chest and neck subcutaneous emphysema. No significant interval change or new finding. Electronically Signed   By: Judie Petit.  Shick M.D.   On: 09/16/2019 11:49   DG Ribs Unilateral W/Chest Left  Result Date: 09/12/2019 CLINICAL DATA:  Left axillary rib pain, fell 2 weeks ago EXAM: LEFT RIBS AND CHEST - 3+ VIEW COMPARISON:  01/05/2019 FINDINGS: Frontal and oblique views of the left thoracic cage are obtained. There are minimally displaced left lateral ninth and tenth rib fractures. Small left pleural effusion likely reflects hemothorax. No  pneumothorax. Consolidation left lung base likely atelectasis. Cardiac silhouette is unremarkable. IMPRESSION: 1. Displaced left lateral ninth and tenth rib fractures, with small left pleural effusion likely reflecting hemothorax. 2. No evidence of pneumothorax. Electronically Signed   By: Sharlet Salina M.D.   On: 09/12/2019 16:48   CT Chest W Contrast  Result Date: 09/12/2019 CLINICAL DATA:  Abdominal pain. Pain status post fall 2 weeks ago. Left-sided rib cage pain EXAM: CT CHEST, ABDOMEN, AND PELVIS WITH CONTRAST TECHNIQUE: Multidetector CT imaging of the chest, abdomen and pelvis was performed following the standard protocol during bolus administration of intravenous contrast. CONTRAST:  75mL OMNIPAQUE IOHEXOL 300 MG/ML  SOLN COMPARISON:  CT chest dated 04/13/2013. FINDINGS: CT CHEST FINDINGS Cardiovascular: The heart size is normal. The ascending thoracic aorta is ectatic measuring approximately 3.9 cm in diameter. There is no evidence for dissection. The arch vessels are grossly patent where visualized. The main pulmonary artery is within normal limits. There is no large centrally located pulmonary embolism. Coronary artery calcifications are noted. There are minimal calcifications of the thoracic aorta. Mediastinum/Nodes: --No mediastinal or hilar lymphadenopathy. --No axillary lymphadenopathy. --No supraclavicular lymphadenopathy. --multiple thyroid nodules are noted. The largest thyroid nodule measures up to approximately 1 cm (axial series 2, image 13). No followup recommended (ref: J Am Coll Radiol. 2015 Feb;12(2): 143-50). --The esophagus is unremarkable Lungs/Pleura: There is a large left-sided pleural effusion. The pleural fluid measures approximately 28-30 Hounsfield units consistent with a probable left-sided hemothorax. There is partial collapse of the left lower lobe. There is no pneumothorax. Mild emphysematous changes are noted. There is scattered bilateral micro nodules that appears  centrally stable since the patient's prior study in 2014 and as such no further follow-up is required. Musculoskeletal: There are multiple acute to subacute displaced and nondisplaced left-sided rib fractures. The rib fractures involve the anterior sixth rib on the patient's left in addition to the posterior eighth through twelfth ribs. There are segmental fractures involving the eighth through eleventh ribs. There is a chronic appearing fracture of the superior sternal body. CT ABDOMEN PELVIS FINDINGS Hepatobiliary: There is severe hepatic steatosis. Cholelithiasis without acute inflammation.There is no biliary ductal dilation. Pancreas: There are multiple calcifications throughout the patient's pancreas consistent with chronic pancreatitis. Spleen: No splenic laceration or hematoma. Adrenals/Urinary Tract: --Adrenal glands: No adrenal hemorrhage. --Right kidney/ureter: No hydronephrosis or perinephric hematoma. --Left kidney/ureter: No hydronephrosis or perinephric hematoma. --Urinary bladder: Unremarkable. Stomach/Bowel: --Stomach/Duodenum: No hiatal hernia or other gastric abnormality. Normal duodenal course and caliber. --Small bowel: No  dilatation or inflammation. --Colon: No focal abnormality. --Appendix: Normal. Vascular/Lymphatic: Atherosclerotic calcification is present within the non-aneurysmal abdominal aorta, without hemodynamically significant stenosis. --No retroperitoneal lymphadenopathy. --No mesenteric lymphadenopathy. --No pelvic or inguinal lymphadenopathy. Reproductive: Status post hysterectomy. No adnexal mass. Other: No ascites or free air. There is a fat containing umbilical hernia. Musculoskeletal. There are age-indeterminate compression fractures of the L3 and L4 vertebral bodies with approximately 20% height loss at the L3 level and 25% height loss at the L4 level. Both of these fractures are favored to be acute to subacute. There are 5 non rib-bearing lumbar type vertebral bodies. There  is grade 1 anterolisthesis of L4 on L5. IMPRESSION: 1. Large left-sided pleural effusion with partial collapse of the left lower lobe. There is increased density involving this left-sided pleural effusion suggesting that may in fact represent a left-sided hemothorax related to the patient's left-sided rib fractures. 2. Multiple acute to subacute displaced and nondisplaced left-sided rib fractures as detailed above. There are segmental fractures of the eighth through eleventh ribs on the left. 3. Age-indeterminate compression fractures of the L3 and L4 vertebral bodies. These are favored to be acute to subacute but should be correlated with point tenderness. 4. Hepatic steatosis. 5. Cholelithiasis without acute inflammation. 6. Chronic pancreatitis. Aortic Atherosclerosis (ICD10-I70.0). Electronically Signed   By: Katherine Mantle M.D.   On: 09/12/2019 20:23   CT Abdomen Pelvis W Contrast  Result Date: 09/12/2019 CLINICAL DATA:  Abdominal pain. Pain status post fall 2 weeks ago. Left-sided rib cage pain EXAM: CT CHEST, ABDOMEN, AND PELVIS WITH CONTRAST TECHNIQUE: Multidetector CT imaging of the chest, abdomen and pelvis was performed following the standard protocol during bolus administration of intravenous contrast. CONTRAST:  42mL OMNIPAQUE IOHEXOL 300 MG/ML  SOLN COMPARISON:  CT chest dated 04/13/2013. FINDINGS: CT CHEST FINDINGS Cardiovascular: The heart size is normal. The ascending thoracic aorta is ectatic measuring approximately 3.9 cm in diameter. There is no evidence for dissection. The arch vessels are grossly patent where visualized. The main pulmonary artery is within normal limits. There is no large centrally located pulmonary embolism. Coronary artery calcifications are noted. There are minimal calcifications of the thoracic aorta. Mediastinum/Nodes: --No mediastinal or hilar lymphadenopathy. --No axillary lymphadenopathy. --No supraclavicular lymphadenopathy. --multiple thyroid nodules are  noted. The largest thyroid nodule measures up to approximately 1 cm (axial series 2, image 13). No followup recommended (ref: J Am Coll Radiol. 2015 Feb;12(2): 143-50). --The esophagus is unremarkable Lungs/Pleura: There is a large left-sided pleural effusion. The pleural fluid measures approximately 28-30 Hounsfield units consistent with a probable left-sided hemothorax. There is partial collapse of the left lower lobe. There is no pneumothorax. Mild emphysematous changes are noted. There is scattered bilateral micro nodules that appears centrally stable since the patient's prior study in 2014 and as such no further follow-up is required. Musculoskeletal: There are multiple acute to subacute displaced and nondisplaced left-sided rib fractures. The rib fractures involve the anterior sixth rib on the patient's left in addition to the posterior eighth through twelfth ribs. There are segmental fractures involving the eighth through eleventh ribs. There is a chronic appearing fracture of the superior sternal body. CT ABDOMEN PELVIS FINDINGS Hepatobiliary: There is severe hepatic steatosis. Cholelithiasis without acute inflammation.There is no biliary ductal dilation. Pancreas: There are multiple calcifications throughout the patient's pancreas consistent with chronic pancreatitis. Spleen: No splenic laceration or hematoma. Adrenals/Urinary Tract: --Adrenal glands: No adrenal hemorrhage. --Right kidney/ureter: No hydronephrosis or perinephric hematoma. --Left kidney/ureter: No hydronephrosis or perinephric hematoma. --Urinary bladder:  Unremarkable. Stomach/Bowel: --Stomach/Duodenum: No hiatal hernia or other gastric abnormality. Normal duodenal course and caliber. --Small bowel: No dilatation or inflammation. --Colon: No focal abnormality. --Appendix: Normal. Vascular/Lymphatic: Atherosclerotic calcification is present within the non-aneurysmal abdominal aorta, without hemodynamically significant stenosis. --No  retroperitoneal lymphadenopathy. --No mesenteric lymphadenopathy. --No pelvic or inguinal lymphadenopathy. Reproductive: Status post hysterectomy. No adnexal mass. Other: No ascites or free air. There is a fat containing umbilical hernia. Musculoskeletal. There are age-indeterminate compression fractures of the L3 and L4 vertebral bodies with approximately 20% height loss at the L3 level and 25% height loss at the L4 level. Both of these fractures are favored to be acute to subacute. There are 5 non rib-bearing lumbar type vertebral bodies. There is grade 1 anterolisthesis of L4 on L5. IMPRESSION: 1. Large left-sided pleural effusion with partial collapse of the left lower lobe. There is increased density involving this left-sided pleural effusion suggesting that may in fact represent a left-sided hemothorax related to the patient's left-sided rib fractures. 2. Multiple acute to subacute displaced and nondisplaced left-sided rib fractures as detailed above. There are segmental fractures of the eighth through eleventh ribs on the left. 3. Age-indeterminate compression fractures of the L3 and L4 vertebral bodies. These are favored to be acute to subacute but should be correlated with point tenderness. 4. Hepatic steatosis. 5. Cholelithiasis without acute inflammation. 6. Chronic pancreatitis. Aortic Atherosclerosis (ICD10-I70.0). Electronically Signed   By: Constance Holster M.D.   On: 09/12/2019 20:23   DG Chest Port 1 View  Result Date: 09/16/2019 CLINICAL DATA:  Hemothorax.  GI bleeding and chest tube EXAM: PORTABLE CHEST 1 VIEW COMPARISON:  Yesterday FINDINGS: Trace left apical pneumothorax. Left-sided chest tube in place with tip overlapping the apex. Extensive left chest wall and lower neck emphysema. Hyperinflated lungs. Left-sided atelectasis. IMPRESSION: 1. Stable trace left apical pneumothorax. 2. Increased chest wall emphysema. Electronically Signed   By: Monte Fantasia M.D.   On: 09/16/2019 08:04    DG CHEST PORT 1 VIEW  Result Date: 09/15/2019 CLINICAL DATA:  64 year old female with history of pneumothorax. EXAM: PORTABLE CHEST 1 VIEW COMPARISON:  Chest x-ray 09/14/2019. FINDINGS: Left-sided chest tube remains in position with tip in the medial aspect of the apex of the left hemithorax. Trace left apical pneumothorax occupying less than 5% of the volume of the left hemithorax. Left basilar opacity which may reflect atelectasis and/or consolidation. No appreciable pleural effusions. No evidence of pulmonary edema. Lucency adjacent to the heart and mediastinal contours, compatible with pneumomediastinum. Heart size is normal. Aortic atherosclerosis. Extensive subcutaneous emphysema throughout the left chest wall tracking in the left cervical region and lower right cervical region. Multiple left-sided rib fractures are again noted, better demonstrated on prior CT the thorax 09/12/2019. IMPRESSION: 1. Support apparatus, as above. 2. Persistent trace left apical pneumothorax and pneumomediastinum. 3. Extensive subcutaneous emphysema in the chest wall and cervical regions. Electronically Signed   By: Vinnie Langton M.D.   On: 09/15/2019 07:25   DG CHEST PORT 1 VIEW  Result Date: 09/14/2019 CLINICAL DATA:  Left chest tube in place. Rib fractures secondary to recent fall. EXAM: PORTABLE CHEST 1 VIEW COMPARISON:  Single-view of the chest 09/13/2019 and 09/12/2019. FINDINGS: Left chest tube is unchanged in position. There is new extensive subcutaneous emphysema in the left chest, left neck and over the upper right chest. Minimal apical pneumothorax is noted. Airspace opacities in the left mid and lower lung zones persist. The right lung is expanded and clear. Heart size is normal.  Atherosclerosis. Multiple left rib fractures again seen. IMPRESSION: Minimal left apical pneumothorax with a left chest tube in place. New extensive subcutaneous emphysema about the left chest with some subcutaneous emphysema also  over the right apex. No change in airspace opacities in left mid and lower lung zones which could be due to atelectasis pulmonary contusion. Electronically Signed   By: Drusilla Kannerhomas  Dalessio M.D.   On: 09/14/2019 08:42   DG Chest Port 1 View  Result Date: 09/13/2019 CLINICAL DATA:  Postop VATS, chest tube EXAM: PORTABLE CHEST 1 VIEW COMPARISON:  09/12/2019 FINDINGS: Interval removal of left-sided pigtail chest tube and placement of large-bore chest tube, tip about the left apex. Near complete resolution of previously seen left pleural effusion. No significant pneumothorax. Heart and mediastinum are unremarkable. Left-sided rib fractures are not well appreciated. IMPRESSION: Interval removal of left-sided pigtail chest tube and placement of large-bore chest tube, tip about the left apex. Near complete resolution of previously seen left pleural effusion. No significant pneumothorax. Electronically Signed   By: Lauralyn PrimesAlex  Bibbey M.D.   On: 09/13/2019 11:56   DG Chest Portable 1 View  Result Date: 09/12/2019 CLINICAL DATA:  Status post chest tube placement EXAM: PORTABLE CHEST 1 VIEW COMPARISON:  Films from earlier in the same day. FINDINGS: Cardiac shadow is stable in appearance. Left-sided pleural effusion is again identified. Left chest tube is now noted in place. No pneumothorax is seen. Right lung remains clear. Cardiac shadow is stable. Aortic calcifications are noted. Previously seen left rib fractures are again noted. IMPRESSION: Interval chest tube placement. Persistent effusion is noted on the left. Left rib fractures are seen without pneumothorax. Electronically Signed   By: Alcide CleverMark  Lukens M.D.   On: 09/12/2019 23:05

## 2019-10-06 ENCOUNTER — Encounter: Payer: Self-pay | Admitting: Gastroenterology

## 2019-10-06 ENCOUNTER — Ambulatory Visit: Payer: Medicaid Other | Admitting: Gastroenterology

## 2019-10-06 ENCOUNTER — Telehealth: Payer: Self-pay | Admitting: Gastroenterology

## 2019-10-06 NOTE — Telephone Encounter (Signed)
Patient was a no show and letter sent  °

## 2019-10-09 ENCOUNTER — Encounter: Payer: Self-pay | Admitting: Physician Assistant

## 2019-10-13 LAB — FUNGUS CULTURE WITH STAIN

## 2019-10-13 LAB — FUNGUS CULTURE RESULT

## 2019-10-13 LAB — FUNGAL ORGANISM REFLEX

## 2019-10-30 LAB — ACID FAST CULTURE WITH REFLEXED SENSITIVITIES (MYCOBACTERIA): Acid Fast Culture: NEGATIVE

## 2020-01-07 ENCOUNTER — Encounter (HOSPITAL_COMMUNITY): Payer: Self-pay | Admitting: Emergency Medicine

## 2020-01-07 ENCOUNTER — Other Ambulatory Visit: Payer: Self-pay

## 2020-01-07 ENCOUNTER — Emergency Department (HOSPITAL_COMMUNITY): Payer: Medicare Other

## 2020-01-07 ENCOUNTER — Emergency Department (HOSPITAL_COMMUNITY)
Admission: EM | Admit: 2020-01-07 | Discharge: 2020-01-08 | Disposition: A | Discharge status: Home / Self Care | Payer: Medicare Other | Attending: Emergency Medicine | Admitting: Emergency Medicine

## 2020-01-07 DIAGNOSIS — Y908 Blood alcohol level of 240 mg/100 ml or more: Secondary | ICD-10-CM | POA: Insufficient documentation

## 2020-01-07 DIAGNOSIS — F10229 Alcohol dependence with intoxication, unspecified: Secondary | ICD-10-CM | POA: Diagnosis not present

## 2020-01-07 DIAGNOSIS — F17210 Nicotine dependence, cigarettes, uncomplicated: Secondary | ICD-10-CM | POA: Diagnosis not present

## 2020-01-07 DIAGNOSIS — R531 Weakness: Secondary | ICD-10-CM | POA: Insufficient documentation

## 2020-01-07 DIAGNOSIS — Z7951 Long term (current) use of inhaled steroids: Secondary | ICD-10-CM | POA: Insufficient documentation

## 2020-01-07 DIAGNOSIS — J449 Chronic obstructive pulmonary disease, unspecified: Secondary | ICD-10-CM | POA: Diagnosis not present

## 2020-01-07 DIAGNOSIS — R5383 Other fatigue: Secondary | ICD-10-CM | POA: Diagnosis present

## 2020-01-07 DIAGNOSIS — E119 Type 2 diabetes mellitus without complications: Secondary | ICD-10-CM | POA: Insufficient documentation

## 2020-01-07 DIAGNOSIS — I10 Essential (primary) hypertension: Secondary | ICD-10-CM | POA: Insufficient documentation

## 2020-01-07 LAB — CBC
HCT: 36.4 % (ref 36.0–46.0)
Hemoglobin: 12.8 g/dL (ref 12.0–15.0)
MCH: 30.9 pg (ref 26.0–34.0)
MCHC: 35.2 g/dL (ref 30.0–36.0)
MCV: 87.9 fL (ref 80.0–100.0)
Platelets: 190 10*3/uL (ref 150–400)
RBC: 4.14 MIL/uL (ref 3.87–5.11)
RDW: 13.7 % (ref 11.5–15.5)
WBC: 2.5 10*3/uL — ABNORMAL LOW (ref 4.0–10.5)
nRBC: 0.8 % — ABNORMAL HIGH (ref 0.0–0.2)

## 2020-01-07 LAB — COMPREHENSIVE METABOLIC PANEL
ALT: 25 U/L (ref 0–44)
AST: 73 U/L — ABNORMAL HIGH (ref 15–41)
Albumin: 3.9 g/dL (ref 3.5–5.0)
Alkaline Phosphatase: 116 U/L (ref 38–126)
Anion gap: 17 — ABNORMAL HIGH (ref 5–15)
BUN: <5 mg/dL — ABNORMAL LOW (ref 8–23)
CO2: 22 mmol/L (ref 22–32)
Calcium: 9 mg/dL (ref 8.9–10.3)
Chloride: 88 mmol/L — ABNORMAL LOW (ref 98–111)
Creatinine, Ser: 0.44 mg/dL (ref 0.44–1.00)
GFR calc Af Amer: >60 mL/min (ref >60)
GFR calc non Af Amer: >60 mL/min (ref >60)
Glucose, Bld: 76 mg/dL (ref 70–99)
Potassium: 4 mmol/L (ref 3.5–5.1)
Sodium: 127 mmol/L — ABNORMAL LOW (ref 135–145)
Total Bilirubin: 0.9 mg/dL (ref 0.3–1.2)
Total Protein: 7.8 g/dL (ref 6.5–8.1)

## 2020-01-07 LAB — AMMONIA: Ammonia: 14 umol/L (ref 9–35)

## 2020-01-07 LAB — ETHANOL: Alcohol, Ethyl (B): 315 mg/dL (ref <10)

## 2020-01-07 LAB — TROPONIN I (HIGH SENSITIVITY): Troponin I (High Sensitivity): 2 ng/L (ref <18)

## 2020-01-07 LAB — CBG MONITORING, ED: Glucose-Capillary: 78 mg/dL (ref 70–99)

## 2020-01-07 MED ORDER — SODIUM CHLORIDE 0.9 % IV BOLUS
1000.0000 mL | Freq: Once | INTRAVENOUS | Status: AC
  Administered 2020-01-07: 1000 mL via INTRAVENOUS

## 2020-01-07 MED ORDER — SODIUM CHLORIDE 0.9 % IV BOLUS
500.0000 mL | Freq: Once | INTRAVENOUS | Status: AC
  Administered 2020-01-07: 500 mL via INTRAVENOUS

## 2020-01-07 NOTE — ED Provider Notes (Addendum)
[facility] EMERGENCY DEPARTMENT Provider Note   CSN: [id number] Arrival date & time: 01/07/20  1731     History Chief Complaint  Patient presents with  . Fatigue    Gloria Santana is a 64 y.o. female with a history of alcohol dependence, stating she drinks 2-4 beers most days, COPD, diabetes, hypertension and is recovering from left rib fractures sustained in a fall and resulted in a hemothorax that required a VATS procedure in March, presenting with generalized fatigue and perceived weight loss and poor appetite.  She states her appetite changes have been chronic for at least the past year and she reports 100 pound weight loss, however review of the chart indicates her weight has been fairly stable with weights back to May 2013 ranging from 110-120# range.  She does endorse decreased appetite, stating she had nothing to eat today, she ate an egg and ham sandwich yesterday.  She endorses early satiety.  She denies abdominal pain, nausea or vomiting.  She also denies chest pain or shortness of breath.  She states her daughter insisted she get checked out here this evening, however her daughter is not currently present with the patient.  She denies diarrhea, constipation, dysuria, also no fevers or chills, headache or dizziness.  Her cough is chronic and nonproductive.  She denies hemoptysis.  She has a 70-year pack year history.   Daughter is now at bedside and offers additional information stating she wants her mother to stop drinking and has tried to get her to rehab but pt refuses.  She lives at home with husband who also drinks so is not supportive or helpful.  Daughter is concerned about pt's generalized weakness and loss of appetite and noticeable weight loss. She also notes having episodes of urinary incontinence with increased urinary frequency.  HPI     Past Medical History:  Diagnosis Date  . Alcohol dependence (HCC)   . Anemia   . COPD (chronic obstructive pulmonary disease)  (HCC)   . Diabetes mellitus without complication (HCC)    Pt states she is not diabetic  . Hypertension   . Poor circulation     Patient Active Problem List   Diagnosis Date Noted  . Hemothorax 09/13/2019  . Rib fracture 09/13/2019  . COPD (chronic obstructive pulmonary disease) (HCC) 09/13/2019  . Hemothorax, left 09/13/2019  . Fatty liver 07/13/2018  . Encounter for screening colonoscopy 07/13/2018    Past Surgical History:  Procedure Laterality Date  . ABDOMINAL HYSTERECTOMY    . VIDEO ASSISTED THORACOSCOPY (VATS)/EMPYEMA Left 09/13/2019   Procedure: VIDEO ASSISTED THORACOSCOPY (VATS) EVACUATION of HEMOTHORAX WITH INTERCOSTAL NERVE BLOCK;  Surgeon: Delight Ovens, MD;  Location: [facility] OR;  Service: Thoracic;  Laterality: Left;  Marland Kitchen VIDEO BRONCHOSCOPY N/A 09/13/2019   Procedure: Video Bronchoscopy;  Surgeon: Delight Ovens, MD;  Location: [facility] OR;  Service: Thoracic;  Laterality: N/A;     OB History    Gravida  3   Para  3   Term  3   Preterm      AB      Living  3     SAB      TAB      Ectopic      Multiple      Live Births              Family History  Problem Relation Age of Onset  . Diabetes Mother   . Colon cancer Neg Hx   . Colon  polyps Neg Hx     Social History   Tobacco Use  . Smoking status: Current Every Day Smoker    Packs/day: 2.00    Years: 35.00    Pack years: 70.00    Types: Cigarettes  . Smokeless tobacco: Never Used  Vaping Use  . Vaping Use: Never used  Substance Use Topics  . Alcohol use: Yes    Alcohol/week: 14.0 standard drinks    Types: 14 Cans of beer per week  . Drug use: No    Home Medications Prior to Admission medications   Medication Sig Start Date End Date Taking? Authorizing Provider  acetaminophen (TYLENOL) 500 MG tablet Take 2 tablets (1,000 mg total) by mouth every 6 (six) hours. 09/16/19  Yes Zannie CoveJoseph, Preetha, MD  albuterol (VENTOLIN HFA) 108 (90 Base) MCG/ACT inhaler Inhale 1-2 puffs into the lungs  every 6 (six) hours as needed for wheezing or shortness of breath.   Yes [provider]  senna-docusate (SENOKOT-S) 8.6-50 MG tablet Take 1 tablet by mouth at bedtime. 09/16/19  Yes Zannie CoveJoseph, Preetha, MD    Allergies    Patient has no known allergies.  Review of Systems   Review of Systems  Constitutional: Positive for appetite change and unexpected weight change. Negative for chills and fever.  HENT: Negative.   Eyes: Negative.   Respiratory: Negative for chest tightness and shortness of breath.   Cardiovascular: Negative for chest pain.  Gastrointestinal: Negative for abdominal pain, nausea and vomiting.  Genitourinary: Negative.   Musculoskeletal: Negative for arthralgias, joint swelling and neck pain.  Skin: Negative.  Negative for rash and wound.  Neurological: Positive for weakness. Negative for dizziness, light-headedness, numbness and headaches.  Psychiatric/Behavioral: Negative.     Physical Exam Updated Vital Signs BP 90/71   Pulse 82   Temp 97.8 F (36.6 C) (Oral)   Resp 12   Ht 5' 7.5" (1.715 m)   Wt 48.5 kg   LMP  (LMP Unknown)   SpO2 96%   BMI 16.51 kg/m   Physical Exam Vitals and nursing note reviewed.  Constitutional:      General: She is not in acute distress.    Appearance: She is well-developed.     Comments: Cachectic   HENT:     Head: Normocephalic and atraumatic.     Mouth/Throat:     Pharynx: Oropharynx is clear.  Eyes:     Conjunctiva/sclera: Conjunctivae normal.  Cardiovascular:     Rate and Rhythm: Normal rate and regular rhythm.     Heart sounds: Normal heart sounds.  Pulmonary:     Effort: Pulmonary effort is normal.     Breath sounds: Normal breath sounds. No wheezing.  Abdominal:     General: Abdomen is flat. Bowel sounds are normal.     Palpations: Abdomen is soft. There is hepatomegaly. There is no fluid wave or splenomegaly.     Tenderness: There is no abdominal tenderness. There is no guarding or rebound.     Comments:  Firm palpable liver edge noted at the inferior rib margin. Nontender.   Musculoskeletal:        General: Normal range of motion.     Cervical back: Normal range of motion.  Skin:    General: Skin is warm and dry.  Neurological:     General: No focal deficit present.     Mental Status: She is alert.     Sensory: Sensation is intact.     Motor: Weakness present.  Comments: Generalized nonfocal weakness.  She was able to stand and for transfer to bed from wheelchair, but unsteady.   Psychiatric:        Mood and Affect: Affect is flat.        Speech: Speech is slurred.        Behavior: Behavior is slowed. Behavior is cooperative.     ED Results / Procedures / Treatments   Labs (all labs ordered are listed, but only abnormal results are displayed) Labs Reviewed  CBC - Abnormal; Notable for the following components:      Result Value   WBC 2.5 (*)    nRBC 0.8 (*)    All other components within normal limits  COMPREHENSIVE METABOLIC PANEL - Abnormal; Notable for the following components:   Sodium 127 (*)    Chloride 88 (*)    BUN <5 (*)    AST 73 (*)    Anion gap 17 (*)    All other components within normal limits  ETHANOL - Abnormal; Notable for the following components:   Alcohol, Ethyl (B) 315 (*)    All other components within normal limits  AMMONIA  URINALYSIS, ROUTINE W REFLEX MICROSCOPIC  CBG MONITORING, ED  TROPONIN I (HIGH SENSITIVITY)    EKG EKG Interpretation  Date/Time:  Sunday January 07 2020 19:16:12 EDT Ventricular Rate:  81 PR Interval:  176 QRS Duration: 70 QT Interval:  416 QTC Calculation: 483 R Axis:   20 Text Interpretation: Normal sinus rhythm Normal ECG No significant change since last tracing Confirmed by Jacalyn Lefevre [phone]) on 01/07/2020 7:31:15 PM   Radiology DG Chest Port 1 View  Result Date: 01/07/2020 CLINICAL DATA:  Weakness EXAM: PORTABLE CHEST 1 VIEW COMPARISON:  09/16/2019 FINDINGS: Cardiac shadow is within normal limits. Mild  aortic calcifications are noted. Lungs are hyperinflated. No acute infiltrate or sizable effusion is seen. Old healed rib fractures on the left are noted. No acute bony abnormality is seen. IMPRESSION: COPD without acute abnormality. Aortic Atherosclerosis (ICD10-I70.0). Electronically Signed   By: Alcide Clever M.D.   On: 01/07/2020 20:55    Procedures Procedures (including critical care time)  Medications Ordered in ED Medications  sodium chloride 0.9 % bolus 1,000 mL (has no administration in time range)  sodium chloride 0.9 % bolus 500 mL (500 mLs Intravenous New Bag/Given 01/07/20 2042)    ED Course  I have reviewed the triage vital signs and the nursing notes.  Pertinent labs & imaging results that were available during my care of the patient were reviewed by me and considered in my medical decision making (see chart for details).    MDM Rules/Calculators/A&P                          Pt with generalized weakness and loss of appetite with weight loss per dg's observation, stable weights historically here in the 110-120 range, last weight was 112 on 3/31,  107 lbs today.  She is very intoxicated with etoh level 315.  Wbc reduced at 2.5, she is not anemic, negative troponin and ammonia levels, cmet with elevated AST at 73, her total protein is 7.8 with an albumin of 3.9.  Pending urinalysis at this time to rule out uti as source of weakness.  Additional IV fluids ordered.  Will plan dc home once patient sober enough for safe dc.    No obvious reason for admission found yet.  If UA normal, would advise close f/u  with pcp.   Resources to be provided for outpatient tx rehab tx if pt chooses this. Dg states she has tried to get her to go but refuses.    Dr. Bebe Shaggy assumes care of pt.  Final Clinical Impression(s) / ED Diagnoses Final diagnoses:  Blood alcohol level of 240 mg/100 ml or more  Weakness    Rx / DC Orders ED Discharge Orders    None       Victoriano Lain 01/07/20  2337    Burgess Amor, PA-C 01/07/20 2359    Zadie Rhine, MD 01/08/20 [phone]

## 2020-01-07 NOTE — Discharge Instructions (Signed)
Your lab tests today and exam are stable, but your blood alcohol level is extremely elevated.  I have provided some resources for you to consider if you decide you want help with your excessive drinking.

## 2020-01-07 NOTE — ED Triage Notes (Addendum)
Patient c/o generalized weakness with loss of appetite x1 week. Patient denies any nausea, vomiting, diarrhea, fevers, or urinary symptoms. Patient does report recent significant weight loss.

## 2020-01-07 NOTE — ED Notes (Signed)
Pt with hx of liver disease due to alcohol   Presents with lethargy  Here for eval

## 2020-01-07 NOTE — ED Notes (Signed)
Unable to monitor as pt on the monitor is being held in from of nurse due to confusion and awaiting move to ICU

## 2020-01-07 NOTE — ED Notes (Signed)
Date and time results received: 01/07/20 9:07 PM (use smartphrase ".now" to insert current time)  Test: alcohol Critical Value: 315  Name of Provider Notified: haviland  Orders Received? Or Actions Taken?:  n/a

## 2020-01-08 DIAGNOSIS — F10229 Alcohol dependence with intoxication, unspecified: Secondary | ICD-10-CM | POA: Diagnosis not present

## 2020-01-08 LAB — URINALYSIS, ROUTINE W REFLEX MICROSCOPIC
Bilirubin Urine: NEGATIVE
Glucose, UA: NEGATIVE mg/dL
Hgb urine dipstick: NEGATIVE
Ketones, ur: NEGATIVE mg/dL
Leukocytes,Ua: NEGATIVE
Nitrite: NEGATIVE
Protein, ur: NEGATIVE mg/dL
Specific Gravity, Urine: 1.002 — ABNORMAL LOW (ref 1.005–1.030)
pH: 5 (ref 5.0–8.0)

## 2020-02-17 ENCOUNTER — Other Ambulatory Visit: Payer: Self-pay

## 2020-02-17 ENCOUNTER — Emergency Department (HOSPITAL_COMMUNITY)
Admission: EM | Admit: 2020-02-17 | Discharge: 2020-02-18 | Disposition: A | Discharge status: Home / Self Care | Payer: Medicare Other | Attending: Emergency Medicine | Admitting: Emergency Medicine

## 2020-02-17 ENCOUNTER — Emergency Department (HOSPITAL_COMMUNITY): Payer: Medicare Other

## 2020-02-17 ENCOUNTER — Encounter (HOSPITAL_COMMUNITY): Payer: Self-pay | Admitting: Emergency Medicine

## 2020-02-17 DIAGNOSIS — E11649 Type 2 diabetes mellitus with hypoglycemia without coma: Secondary | ICD-10-CM | POA: Insufficient documentation

## 2020-02-17 DIAGNOSIS — I10 Essential (primary) hypertension: Secondary | ICD-10-CM | POA: Diagnosis not present

## 2020-02-17 DIAGNOSIS — Z7951 Long term (current) use of inhaled steroids: Secondary | ICD-10-CM | POA: Insufficient documentation

## 2020-02-17 DIAGNOSIS — F10129 Alcohol abuse with intoxication, unspecified: Secondary | ICD-10-CM | POA: Insufficient documentation

## 2020-02-17 DIAGNOSIS — J449 Chronic obstructive pulmonary disease, unspecified: Secondary | ICD-10-CM | POA: Insufficient documentation

## 2020-02-17 DIAGNOSIS — Z79899 Other long term (current) drug therapy: Secondary | ICD-10-CM | POA: Diagnosis not present

## 2020-02-17 DIAGNOSIS — F10920 Alcohol use, unspecified with intoxication, uncomplicated: Secondary | ICD-10-CM

## 2020-02-17 DIAGNOSIS — F17210 Nicotine dependence, cigarettes, uncomplicated: Secondary | ICD-10-CM | POA: Insufficient documentation

## 2020-02-17 DIAGNOSIS — E162 Hypoglycemia, unspecified: Secondary | ICD-10-CM

## 2020-02-17 DIAGNOSIS — R4182 Altered mental status, unspecified: Secondary | ICD-10-CM | POA: Diagnosis present

## 2020-02-17 LAB — CBC
HCT: 34.7 % — ABNORMAL LOW (ref 36.0–46.0)
Hemoglobin: 12.1 g/dL (ref 12.0–15.0)
MCH: 30.8 pg (ref 26.0–34.0)
MCHC: 34.9 g/dL (ref 30.0–36.0)
MCV: 88.3 fL (ref 80.0–100.0)
Platelets: 120 10*3/uL — ABNORMAL LOW (ref 150–400)
RBC: 3.93 MIL/uL (ref 3.87–5.11)
RDW: 13.4 % (ref 11.5–15.5)
WBC: 1.5 10*3/uL — ABNORMAL LOW (ref 4.0–10.5)
nRBC: 0 % (ref 0.0–0.2)

## 2020-02-17 LAB — AMMONIA: Ammonia: 23 umol/L (ref 9–35)

## 2020-02-17 NOTE — ED Provider Notes (Signed)
[facility] EMERGENCY DEPARTMENT Provider Note   CSN: [id number] Arrival date & time: 02/17/20  2220   Time seen 11:10 PM  History Chief Complaint  Patient presents with  . Altered Mental Status    Gloria Santana is a 64 y.o. female.  HPI   Patient presents via EMS.  Patient does not know who called the police.  She states the past couple days when she stands up her legs give out on her.  She states she falls to the floor.  She is not sure if she hit her head but she denies headache, chest pain, shortness of breath, nausea, vomiting, diarrhea, abdominal pain, or extremity pain.  Patient does admit to drinking but states she only drinks 1 can a day.  PCP Avon Gully, MD   Past Medical History:  Diagnosis Date  . Alcohol dependence (HCC)   . Anemia   . COPD (chronic obstructive pulmonary disease) (HCC)   . Diabetes mellitus without complication (HCC)    Pt states she is not diabetic  . Hypertension   . Poor circulation     Patient Active Problem List   Diagnosis Date Noted  . Hemothorax 09/13/2019  . Rib fracture 09/13/2019  . COPD (chronic obstructive pulmonary disease) (HCC) 09/13/2019  . Hemothorax, left 09/13/2019  . Fatty liver 07/13/2018  . Encounter for screening colonoscopy 07/13/2018    Past Surgical History:  Procedure Laterality Date  . ABDOMINAL HYSTERECTOMY    . VIDEO ASSISTED THORACOSCOPY (VATS)/EMPYEMA Left 09/13/2019   Procedure: VIDEO ASSISTED THORACOSCOPY (VATS) EVACUATION of HEMOTHORAX WITH INTERCOSTAL NERVE BLOCK;  Surgeon: Delight Ovens, MD;  Location: [facility] OR;  Service: Thoracic;  Laterality: Left;  Marland Kitchen VIDEO BRONCHOSCOPY N/A 09/13/2019   Procedure: Video Bronchoscopy;  Surgeon: Delight Ovens, MD;  Location: [facility] OR;  Service: Thoracic;  Laterality: N/A;     OB History    Gravida  3   Para  3   Term  3   Preterm      AB      Living  3     SAB      TAB      Ectopic      Multiple      Live Births               Family History  Problem Relation Age of Onset  . Diabetes Mother   . Colon cancer Neg Hx   . Colon polyps Neg Hx     Social History   Tobacco Use  . Smoking status: Current Every Day Smoker    Packs/day: 2.00    Years: 35.00    Pack years: 70.00    Types: Cigarettes  . Smokeless tobacco: Never Used  Vaping Use  . Vaping Use: Never used  Substance Use Topics  . Alcohol use: Yes    Alcohol/week: 14.0 standard drinks    Types: 14 Cans of beer per week  . Drug use: No  Patient states she smokes 1 pack/week Patient states she drinks "1 can of beer" a day. Patient states she lives with her husband  Home Medications Prior to Admission medications   Medication Sig Start Date End Date Taking? Authorizing Provider  acetaminophen (TYLENOL) 500 MG tablet Take 2 tablets (1,000 mg total) by mouth every 6 (six) hours. 09/16/19   Zannie Cove, MD  albuterol (VENTOLIN HFA) 108 (90 Base) MCG/ACT inhaler Inhale 1-2 puffs into the lungs every 6 (six) hours as needed for wheezing or  shortness of breath.    [provider]  senna-docusate (SENOKOT-S) 8.6-50 MG tablet Take 1 tablet by mouth at bedtime. 09/16/19   Zannie CoveJoseph, Preetha, MD  Patient denies taking any prescription medication  Allergies    Patient has no known allergies.  Review of Systems   Review of Systems  All other systems reviewed and are negative.   Physical Exam Updated Vital Signs BP 102/77   Pulse 79   Temp 98 F (36.7 C) (Oral)   Resp 15   Ht 5\' 4"  (1.626 m)   Wt 45.8 kg   LMP  (LMP Unknown)   SpO2 94%   BMI 17.33 kg/m   Physical Exam Vitals and nursing note reviewed.  Constitutional:      Comments: Thin female who speaks softly and speech is delayed and slurred, she has very poor eye contact  HENT:     Head: Normocephalic and atraumatic.     Right Ear: External ear normal.     Left Ear: External ear normal.  Eyes:     Extraocular Movements: Extraocular movements intact.      Conjunctiva/sclera: Conjunctivae normal.     Pupils: Pupils are equal, round, and reactive to light.  Cardiovascular:     Rate and Rhythm: Normal rate and regular rhythm.     Pulses: Normal pulses.     Heart sounds: Normal heart sounds. No murmur heard.   Pulmonary:     Effort: Pulmonary effort is normal. No respiratory distress.     Breath sounds: Normal breath sounds. No wheezing, rhonchi or rales.  Abdominal:     General: Abdomen is flat. Bowel sounds are normal.     Palpations: Abdomen is soft.     Tenderness: There is no abdominal tenderness.  Musculoskeletal:        General: No swelling, deformity or signs of injury. Normal range of motion.     Cervical back: Normal range of motion.  Skin:    General: Skin is warm and dry.     Findings: No bruising.  Neurological:     General: No focal deficit present.     Mental Status: She is oriented to person, place, and time.     Cranial Nerves: No cranial nerve deficit.  Psychiatric:        Mood and Affect: Affect is flat.        Speech: Speech is delayed and slurred.        Behavior: Behavior is slowed. Behavior is cooperative.     Comments: Patient appears to be intoxicated, she can fall asleep during conversation.     ED Results / Procedures / Treatments   Labs (all labs ordered are listed, but only abnormal results are displayed) Results for orders placed or performed during the hospital encounter of 02/17/20  CBC  Result Value Ref Range   WBC 1.5 (L) 4.0 - 10.5 K/uL   RBC 3.93 3.87 - 5.11 MIL/uL   Hemoglobin 12.1 12.0 - 15.0 g/dL   HCT 47.834.7 (L) 36 - 46 %   MCV 88.3 80.0 - 100.0 fL   MCH 30.8 26.0 - 34.0 pg   MCHC 34.9 30.0 - 36.0 g/dL   RDW 29.513.4 62.111.5 - 30.815.5 %   Platelets 120 (L) 150 - 400 K/uL   nRBC 0.0 0.0 - 0.2 %  Comprehensive metabolic panel  Result Value Ref Range   Sodium 135 135 - 145 mmol/L   Potassium 3.9 3.5 - 5.1 mmol/L   Chloride 96 (L)  98 - 111 mmol/L   CO2 24 22 - 32 mmol/L   Glucose, Bld 63 (L) 70  - 99 mg/dL   BUN <5 (L) 8 - 23 mg/dL   Creatinine, Ser 1.61 0.44 - 1.00 mg/dL   Calcium 8.7 (L) 8.9 - 10.3 mg/dL   Total Protein 6.6 6.5 - 8.1 g/dL   Albumin 3.8 3.5 - 5.0 g/dL   AST 096 (H) 15 - 41 U/L   ALT 47 (H) 0 - 44 U/L   Alkaline Phosphatase 134 (H) 38 - 126 U/L   Total Bilirubin 1.1 0.3 - 1.2 mg/dL   GFR calc non Af Amer >60 >60 mL/min   GFR calc Af Amer >60 >60 mL/min   Anion gap 15 5 - 15  Ethanol  Result Value Ref Range   Alcohol, Ethyl (B) 371 (HH) <10 mg/dL  Lactic acid, plasma  Result Value Ref Range   Lactic Acid, Venous 2.1 (HH) 0.5 - 1.9 mmol/L  Lactic acid, plasma  Result Value Ref Range   Lactic Acid, Venous 1.7 0.5 - 1.9 mmol/L  Ammonia  Result Value Ref Range   Ammonia 23 9 - 35 umol/L  Magnesium  Result Value Ref Range   Magnesium 1.9 1.7 - 2.4 mg/dL  CBC with Differential/Platelet  Result Value Ref Range   WBC 1.6 (L) 4.0 - 10.5 K/uL   RBC 3.76 (L) 3.87 - 5.11 MIL/uL   Hemoglobin 11.7 (L) 12.0 - 15.0 g/dL   HCT 04.5 (L) 36 - 46 %   MCV 89.9 80.0 - 100.0 fL   MCH 31.1 26.0 - 34.0 pg   MCHC 34.6 30.0 - 36.0 g/dL   RDW 40.9 81.1 - 91.4 %   Platelets 89 (L) 150 - 400 K/uL   nRBC 0.0 0.0 - 0.2 %   Neutrophils Relative % 30 %   Neutro Abs 0.5 (L) 1.7 - 7.7 K/uL   Lymphocytes Relative 48 %   Lymphs Abs 0.8 0.7 - 4.0 K/uL   Monocytes Relative 19 %   Monocytes Absolute 0.3 0 - 1 K/uL   Eosinophils Relative 1 %   Eosinophils Absolute 0.0 0 - 0 K/uL   Basophils Relative 1 %   Basophils Absolute 0.0 0 - 0 K/uL   Immature Granulocytes 1 %   Abs Immature Granulocytes 0.01 0.00 - 0.07 K/uL  CBG monitoring, ED  Result Value Ref Range   Glucose-Capillary 183 (H) 70 - 99 mg/dL  CBG monitoring, ED  Result Value Ref Range   Glucose-Capillary 123 (H) 70 - 99 mg/dL  CBG monitoring, ED  Result Value Ref Range   Glucose-Capillary 78 70 - 99 mg/dL  CBG monitoring, ED  Result Value Ref Range   Glucose-Capillary 111 (H) 70 - 99 mg/dL  Troponin I (High  Sensitivity)  Result Value Ref Range   Troponin I (High Sensitivity) 2 <18 ng/L  Troponin I (High Sensitivity)  Result Value Ref Range   Troponin I (High Sensitivity) 3 <18 ng/L   Laboratory interpretation all normal except mildly elevated lactic acid, alcohol intoxication, elevation of LFTs consistent with alcoholism, hypoglycemia, low white blood cell count    EKG EKG Interpretation  Date/Time:  Saturday February 17 2020 23:38:04 EDT Ventricular Rate:  90 PR Interval:    QRS Duration: 78 QT Interval:  413 QTC Calculation: 506 R Axis:   59 Text Interpretation: Sinus rhythm Abnormal R-wave progression, early transition Prolonged QT interval Baseline wander No significant change since last tracing 07 Jan 2020  Confirmed by Devoria Albe ([id number]) on 02/18/2020 2:39:39 AM   Radiology CT Head Wo Contrast  Result Date: 02/18/2020 CLINICAL DATA:  Mental status changes EXAM: CT HEAD WITHOUT CONTRAST TECHNIQUE: Contiguous axial images were obtained from the base of the skull through the vertex without intravenous contrast. COMPARISON:  01/05/2019 FINDINGS: Brain: There is age advanced atrophy and chronic small vessel disease changes. No acute intracranial abnormality. Specifically, no hemorrhage, hydrocephalus, mass lesion, acute infarction, or significant intracranial injury. Vascular: No hyperdense vessel or unexpected calcification. Skull: No acute calvarial abnormality. Sinuses/Orbits: Visualized paranasal sinuses and mastoids clear. Orbital soft tissues unremarkable. Other: None IMPRESSION: Age advanced atrophy, chronic small vessel disease. No acute intracranial abnormality. Electronically Signed   By: Charlett Nose M.D.   On: 02/18/2020 01:17    Procedures .Critical Care Performed by: Devoria Albe, MD Authorized by: Devoria Albe, MD   Critical care provider statement:    Critical care time (minutes):  38   Critical care was necessary to treat or prevent imminent or life-threatening  deterioration of the following conditions:  Endocrine crisis   Critical care was time spent personally by me on the following activities:  Examination of patient, obtaining history from patient or surrogate, ordering and review of laboratory studies, ordering and review of radiographic studies and re-evaluation of patient's condition   (including critical care time)  Medications Ordered in ED Medications  dextrose 5 %-0.9 % sodium chloride infusion ( Intravenous New Bag/Given 02/18/20 0040)  dextrose 50 % solution 50 mL (50 mLs Intravenous Given 02/18/20 0039)  sodium chloride 0.9 % bolus 500 mL (0 mLs Intravenous Stopped 02/18/20 0328)    ED Course  I have reviewed the triage vital signs and the nursing notes.  Pertinent labs & imaging results that were available during my care of the patient were reviewed by me and considered in my medical decision making (see chart for details).    MDM Rules/Calculators/A&P                           Laboratory testing and CT head done to evaluate patient altered mental status however I have have a feeling she is intoxicated.  Patient was hypoglycemic, she is very intoxicated.  She was given 1 amp of D50 and started on D5 normal saline.  We will keep a close eye on her CBG.  Her lactic acid was minimally elevated and she was given some IV fluids.  12:40 AM patient seems a little bit more alert, she is able to hold conversation.  Patient states she is not on oxygen at home.  EMS had put her on oxygen and she is currently 100%.  Her oxygen was stopped.  We will monitor to make sure she does not become hypoxic.  3:03 AM patient gave me permission to discuss her test results of her daughter,Tarri.  She was given her test results, she was advised patient would be kept in the ED overnight until she sobers up a little bit more.  Patient is going to go home and we can call her when patient is ready to be discharged.  Patient's repeat CBG at 2:40 AM is 183.  Her D5  drip was slowed down to 50 cc an hour down from 125 cc/h.  Her CBG at 4:17 AM was 123, we continued her D5 at 50 cc an hour.  6:08 AM patient CBG is 78.  She is now easily awakened and states she is hungry.  She was given some to eat.  She seems sober now.  We discussed that she was extremely intoxicated when she came in last night.  Her daughter had called a little bit before 6 to see if she is ready to come home and she was advised that we were getting her morning labs done.  7:09 AM patient CBG is 111. She is awake and alert now and able to eat and drink. She was discharged home with her daughter. She was given a resource guide to try to get outpatient treatment for her alcoholism.  Final Clinical Impression(s) / ED Diagnoses Final diagnoses:  Alcoholic intoxication without complication (HCC)  Hypoglycemia    Rx / DC Orders ED Discharge Orders    None     Plan discharge  Devoria Albe, MD, Concha Pyo, MD 02/18/20 [phone]

## 2020-02-17 NOTE — ED Notes (Signed)
Here by EMS  Per EMS family upset   Little other information    Per pt she feels bad

## 2020-02-17 NOTE — ED Triage Notes (Signed)
AMS x 2 days   Here for eval   Per family has not eaten in 3 days

## 2020-02-18 DIAGNOSIS — F10129 Alcohol abuse with intoxication, unspecified: Secondary | ICD-10-CM | POA: Diagnosis not present

## 2020-02-18 LAB — COMPREHENSIVE METABOLIC PANEL
ALT: 47 U/L — ABNORMAL HIGH (ref 0–44)
AST: 199 U/L — ABNORMAL HIGH (ref 15–41)
Albumin: 3.8 g/dL (ref 3.5–5.0)
Alkaline Phosphatase: 134 U/L — ABNORMAL HIGH (ref 38–126)
Anion gap: 15 (ref 5–15)
BUN: <5 mg/dL — ABNORMAL LOW (ref 8–23)
CO2: 24 mmol/L (ref 22–32)
Calcium: 8.7 mg/dL — ABNORMAL LOW (ref 8.9–10.3)
Chloride: 96 mmol/L — ABNORMAL LOW (ref 98–111)
Creatinine, Ser: 0.52 mg/dL (ref 0.44–1.00)
GFR calc Af Amer: >60 mL/min (ref >60)
GFR calc non Af Amer: >60 mL/min (ref >60)
Glucose, Bld: 63 mg/dL — ABNORMAL LOW (ref 70–99)
Potassium: 3.9 mmol/L (ref 3.5–5.1)
Sodium: 135 mmol/L (ref 135–145)
Total Bilirubin: 1.1 mg/dL (ref 0.3–1.2)
Total Protein: 6.6 g/dL (ref 6.5–8.1)

## 2020-02-18 LAB — CBC WITH DIFFERENTIAL/PLATELET
Abs Immature Granulocytes: 0.01 10*3/uL (ref 0.00–0.07)
Basophils Absolute: 0 10*3/uL (ref 0.0–0.1)
Basophils Relative: 1 %
Eosinophils Absolute: 0 10*3/uL (ref 0.0–0.5)
Eosinophils Relative: 1 %
HCT: 33.8 % — ABNORMAL LOW (ref 36.0–46.0)
Hemoglobin: 11.7 g/dL — ABNORMAL LOW (ref 12.0–15.0)
Immature Granulocytes: 1 %
Lymphocytes Relative: 48 %
Lymphs Abs: 0.8 10*3/uL (ref 0.7–4.0)
MCH: 31.1 pg (ref 26.0–34.0)
MCHC: 34.6 g/dL (ref 30.0–36.0)
MCV: 89.9 fL (ref 80.0–100.0)
Monocytes Absolute: 0.3 10*3/uL (ref 0.1–1.0)
Monocytes Relative: 19 %
Neutro Abs: 0.5 10*3/uL — ABNORMAL LOW (ref 1.7–7.7)
Neutrophils Relative %: 30 %
Platelets: 89 10*3/uL — ABNORMAL LOW (ref 150–400)
RBC: 3.76 MIL/uL — ABNORMAL LOW (ref 3.87–5.11)
RDW: 13.6 % (ref 11.5–15.5)
WBC: 1.6 10*3/uL — ABNORMAL LOW (ref 4.0–10.5)
nRBC: 0 % (ref 0.0–0.2)

## 2020-02-18 LAB — CBG MONITORING, ED
Glucose-Capillary: 183 mg/dL — ABNORMAL HIGH (ref 70–99)
Glucose-Capillary: 123 mg/dL — ABNORMAL HIGH (ref 70–99)
Glucose-Capillary: 78 mg/dL (ref 70–99)
Glucose-Capillary: 111 mg/dL — ABNORMAL HIGH (ref 70–99)

## 2020-02-18 LAB — MAGNESIUM: Magnesium: 1.9 mg/dL (ref 1.7–2.4)

## 2020-02-18 LAB — LACTIC ACID, PLASMA
Lactic Acid, Venous: 2.1 mmol/L (ref 0.5–1.9)
Lactic Acid, Venous: 1.7 mmol/L (ref 0.5–1.9)

## 2020-02-18 LAB — ETHANOL: Alcohol, Ethyl (B): 371 mg/dL (ref <10)

## 2020-02-18 LAB — TROPONIN I (HIGH SENSITIVITY)
Troponin I (High Sensitivity): 2 ng/L (ref <18)
Troponin I (High Sensitivity): 3 ng/L (ref <18)

## 2020-02-18 MED ORDER — SODIUM CHLORIDE 0.9 % IV BOLUS
500.0000 mL | Freq: Once | INTRAVENOUS | Status: AC
  Administered 2020-02-18: 500 mL via INTRAVENOUS

## 2020-02-18 MED ORDER — DEXTROSE-NACL 5-0.9 % IV SOLN: INTRAVENOUS | Status: DC

## 2020-02-18 MED ORDER — DEXTROSE 50 % IV SOLN
1.0000 | Freq: Once | INTRAVENOUS | Status: AC
  Administered 2020-02-18: 50 mL via INTRAVENOUS
  Filled 2020-02-18: qty 50

## 2020-02-18 NOTE — Discharge Instructions (Signed)
You drink too much alcohol!  Look at the resource guide and find an outpatient facility to get you help for your alcoholism.  You need to try to eat a regular diet.

## 2020-02-18 NOTE — ED Notes (Addendum)
Date and time results received: 02/18/20 0010  Test: lactic Critical Value: 2.1  Test: ETOH Critical Value: 371  Name of Provider Notified: Lynelle Doctor, MD  Orders Received? Or Actions Taken?: acknowledged

## 2020-05-01 ENCOUNTER — Other Ambulatory Visit (HOSPITAL_COMMUNITY): Payer: Self-pay | Admitting: Gerontology

## 2020-05-01 DIAGNOSIS — Z87891 Personal history of nicotine dependence: Secondary | ICD-10-CM

## 2020-05-01 DIAGNOSIS — F17209 Nicotine dependence, unspecified, with unspecified nicotine-induced disorders: Secondary | ICD-10-CM

## 2020-05-28 ENCOUNTER — Ambulatory Visit (HOSPITAL_COMMUNITY)
Admission: RE | Admit: 2020-05-28 | Discharge: 2020-05-28 | Disposition: A | Discharge status: Home / Self Care | Payer: Medicare Other | Source: Ambulatory Visit | Attending: Gerontology | Admitting: Gerontology

## 2020-05-28 ENCOUNTER — Other Ambulatory Visit: Payer: Self-pay

## 2020-05-28 DIAGNOSIS — F17209 Nicotine dependence, unspecified, with unspecified nicotine-induced disorders: Secondary | ICD-10-CM | POA: Diagnosis present

## 2020-05-28 DIAGNOSIS — Z87891 Personal history of nicotine dependence: Secondary | ICD-10-CM | POA: Insufficient documentation

## 2020-06-13 ENCOUNTER — Emergency Department (HOSPITAL_COMMUNITY)
Admission: EM | Admit: 2020-06-13 | Discharge: 2020-06-13 | Disposition: A | Discharge status: Home / Self Care | Payer: Medicare Other | Attending: Emergency Medicine | Admitting: Emergency Medicine

## 2020-06-13 ENCOUNTER — Emergency Department (HOSPITAL_COMMUNITY): Payer: Medicare Other

## 2020-06-13 ENCOUNTER — Encounter (HOSPITAL_COMMUNITY): Payer: Self-pay | Admitting: *Deleted

## 2020-06-13 DIAGNOSIS — E119 Type 2 diabetes mellitus without complications: Secondary | ICD-10-CM | POA: Insufficient documentation

## 2020-06-13 DIAGNOSIS — I10 Essential (primary) hypertension: Secondary | ICD-10-CM | POA: Diagnosis not present

## 2020-06-13 DIAGNOSIS — T7421XA Adult sexual abuse, confirmed, initial encounter: Secondary | ICD-10-CM | POA: Insufficient documentation

## 2020-06-13 DIAGNOSIS — Y908 Blood alcohol level of 240 mg/100 ml or more: Secondary | ICD-10-CM | POA: Diagnosis not present

## 2020-06-13 DIAGNOSIS — F17210 Nicotine dependence, cigarettes, uncomplicated: Secondary | ICD-10-CM | POA: Diagnosis not present

## 2020-06-13 DIAGNOSIS — J449 Chronic obstructive pulmonary disease, unspecified: Secondary | ICD-10-CM | POA: Diagnosis not present

## 2020-06-13 DIAGNOSIS — R410 Disorientation, unspecified: Secondary | ICD-10-CM | POA: Insufficient documentation

## 2020-06-13 LAB — CBC WITH DIFFERENTIAL/PLATELET
Abs Immature Granulocytes: 0 10*3/uL (ref 0.00–0.07)
Basophils Absolute: 0 10*3/uL (ref 0.0–0.1)
Basophils Relative: 1 %
Eosinophils Absolute: 0 10*3/uL (ref 0.0–0.5)
Eosinophils Relative: 2 %
HCT: 39 % (ref 36.0–46.0)
Hemoglobin: 13.5 g/dL (ref 12.0–15.0)
Immature Granulocytes: 0 %
Lymphocytes Relative: 55 %
Lymphs Abs: 1.1 10*3/uL (ref 0.7–4.0)
MCH: 31.2 pg (ref 26.0–34.0)
MCHC: 34.6 g/dL (ref 30.0–36.0)
MCV: 90.1 fL (ref 80.0–100.0)
Monocytes Absolute: 0.3 10*3/uL (ref 0.1–1.0)
Monocytes Relative: 12 %
Neutro Abs: 0.6 10*3/uL — ABNORMAL LOW (ref 1.7–7.7)
Neutrophils Relative %: 30 %
Platelets: 122 10*3/uL — ABNORMAL LOW (ref 150–400)
RBC: 4.33 MIL/uL (ref 3.87–5.11)
RDW: 13.8 % (ref 11.5–15.5)
WBC: 2.1 10*3/uL — ABNORMAL LOW (ref 4.0–10.5)
nRBC: 0 % (ref 0.0–0.2)

## 2020-06-13 LAB — URINALYSIS, ROUTINE W REFLEX MICROSCOPIC
Bilirubin Urine: NEGATIVE
Glucose, UA: 150 mg/dL — AB
Hgb urine dipstick: NEGATIVE
Ketones, ur: NEGATIVE mg/dL
Leukocytes,Ua: NEGATIVE
Nitrite: NEGATIVE
Protein, ur: NEGATIVE mg/dL
Specific Gravity, Urine: 1.005 (ref 1.005–1.030)
pH: 5 (ref 5.0–8.0)

## 2020-06-13 LAB — COMPREHENSIVE METABOLIC PANEL
ALT: 26 U/L (ref 0–44)
AST: 116 U/L — ABNORMAL HIGH (ref 15–41)
Albumin: 3.8 g/dL (ref 3.5–5.0)
Alkaline Phosphatase: 121 U/L (ref 38–126)
Anion gap: 16 — ABNORMAL HIGH (ref 5–15)
BUN: <5 mg/dL — ABNORMAL LOW (ref 8–23)
CO2: 22 mmol/L (ref 22–32)
Calcium: 8.7 mg/dL — ABNORMAL LOW (ref 8.9–10.3)
Chloride: 92 mmol/L — ABNORMAL LOW (ref 98–111)
Creatinine, Ser: 0.51 mg/dL (ref 0.44–1.00)
GFR, Estimated: >60 mL/min (ref >60)
Glucose, Bld: 261 mg/dL — ABNORMAL HIGH (ref 70–99)
Potassium: 3.6 mmol/L (ref 3.5–5.1)
Sodium: 130 mmol/L — ABNORMAL LOW (ref 135–145)
Total Bilirubin: 0.7 mg/dL (ref 0.3–1.2)
Total Protein: 7.6 g/dL (ref 6.5–8.1)

## 2020-06-13 LAB — ETHANOL: Alcohol, Ethyl (B): 248 mg/dL — ABNORMAL HIGH (ref <10)

## 2020-06-13 NOTE — Discharge Instructions (Addendum)
Please follow up with your primary care provider within 5-7 days for re-evaluation of your symptoms. If you do not have a primary care provider, information for a healthcare clinic has been provided for you to make arrangements for follow up care. Please return to the emergency department for any new or worsening symptoms. ° °

## 2020-06-13 NOTE — ED Notes (Signed)
Pt finally allowed lab to be drawn.

## 2020-06-13 NOTE — ED Triage Notes (Signed)
Patient states she does not know why she is here today. Family member has been contacted.
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
## 2020-06-13 NOTE — SANE Note (Signed)
The SANE/FNE (Forensic Nurse Examiner) consult has been completed. The primary RN and/or provider have been notified. Please contact the SANE/FNE nurse on call (listed in Amion) with any further concerns.  
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
## 2020-07-16 ENCOUNTER — Emergency Department (HOSPITAL_COMMUNITY)
Admission: EM | Admit: 2020-07-16 | Discharge: 2020-07-16 | Disposition: A | Discharge status: Home / Self Care | Payer: Medicare Other | Attending: Emergency Medicine | Admitting: Emergency Medicine

## 2020-07-16 ENCOUNTER — Emergency Department (HOSPITAL_COMMUNITY): Payer: Medicare Other

## 2020-07-16 ENCOUNTER — Encounter (HOSPITAL_COMMUNITY): Payer: Self-pay | Admitting: *Deleted

## 2020-07-16 ENCOUNTER — Other Ambulatory Visit: Payer: Self-pay

## 2020-07-16 DIAGNOSIS — Z7289 Other problems related to lifestyle: Secondary | ICD-10-CM

## 2020-07-16 DIAGNOSIS — U071 COVID-19: Secondary | ICD-10-CM | POA: Diagnosis not present

## 2020-07-16 DIAGNOSIS — Y908 Blood alcohol level of 240 mg/100 ml or more: Secondary | ICD-10-CM | POA: Diagnosis not present

## 2020-07-16 DIAGNOSIS — F1099 Alcohol use, unspecified with unspecified alcohol-induced disorder: Secondary | ICD-10-CM | POA: Insufficient documentation

## 2020-07-16 DIAGNOSIS — I10 Essential (primary) hypertension: Secondary | ICD-10-CM | POA: Diagnosis not present

## 2020-07-16 DIAGNOSIS — F17210 Nicotine dependence, cigarettes, uncomplicated: Secondary | ICD-10-CM | POA: Insufficient documentation

## 2020-07-16 DIAGNOSIS — R531 Weakness: Secondary | ICD-10-CM

## 2020-07-16 DIAGNOSIS — E119 Type 2 diabetes mellitus without complications: Secondary | ICD-10-CM | POA: Insufficient documentation

## 2020-07-16 DIAGNOSIS — M6281 Muscle weakness (generalized): Secondary | ICD-10-CM | POA: Insufficient documentation

## 2020-07-16 DIAGNOSIS — R569 Unspecified convulsions: Secondary | ICD-10-CM | POA: Diagnosis present

## 2020-07-16 DIAGNOSIS — J449 Chronic obstructive pulmonary disease, unspecified: Secondary | ICD-10-CM | POA: Diagnosis not present

## 2020-07-16 DIAGNOSIS — Z789 Other specified health status: Secondary | ICD-10-CM

## 2020-07-16 LAB — CBC WITH DIFFERENTIAL/PLATELET
Abs Immature Granulocytes: 0 10*3/uL (ref 0.00–0.07)
Basophils Absolute: 0 10*3/uL (ref 0.0–0.1)
Basophils Relative: 0 %
Eosinophils Absolute: 0 10*3/uL (ref 0.0–0.5)
Eosinophils Relative: 0 %
HCT: 32.9 % — ABNORMAL LOW (ref 36.0–46.0)
Hemoglobin: 11.7 g/dL — ABNORMAL LOW (ref 12.0–15.0)
Immature Granulocytes: 0 %
Lymphocytes Relative: 21 %
Lymphs Abs: 0.6 10*3/uL — ABNORMAL LOW (ref 0.7–4.0)
MCH: 31.3 pg (ref 26.0–34.0)
MCHC: 35.6 g/dL (ref 30.0–36.0)
MCV: 88 fL (ref 80.0–100.0)
Monocytes Absolute: 0.4 10*3/uL (ref 0.1–1.0)
Monocytes Relative: 15 %
Neutro Abs: 1.9 10*3/uL (ref 1.7–7.7)
Neutrophils Relative %: 64 %
Platelets: 101 10*3/uL — ABNORMAL LOW (ref 150–400)
RBC: 3.74 MIL/uL — ABNORMAL LOW (ref 3.87–5.11)
RDW: 13 % (ref 11.5–15.5)
WBC: 2.9 10*3/uL — ABNORMAL LOW (ref 4.0–10.5)
nRBC: 0 % (ref 0.0–0.2)

## 2020-07-16 LAB — BASIC METABOLIC PANEL
Anion gap: 11 (ref 5–15)
BUN: 5 mg/dL — ABNORMAL LOW (ref 8–23)
CO2: 24 mmol/L (ref 22–32)
Calcium: 9 mg/dL (ref 8.9–10.3)
Chloride: 92 mmol/L — ABNORMAL LOW (ref 98–111)
Creatinine, Ser: 0.45 mg/dL (ref 0.44–1.00)
GFR, Estimated: >60 mL/min (ref >60)
Glucose, Bld: 94 mg/dL (ref 70–99)
Potassium: 3.7 mmol/L (ref 3.5–5.1)
Sodium: 127 mmol/L — ABNORMAL LOW (ref 135–145)

## 2020-07-16 LAB — RAPID URINE DRUG SCREEN, HOSP PERFORMED
Amphetamines: NOT DETECTED
Barbiturates: NOT DETECTED
Benzodiazepines: NOT DETECTED
Cocaine: NOT DETECTED
Opiates: NOT DETECTED
Tetrahydrocannabinol: NOT DETECTED

## 2020-07-16 LAB — TROPONIN I (HIGH SENSITIVITY)
Troponin I (High Sensitivity): 3 ng/L (ref <18)
Troponin I (High Sensitivity): 3 ng/L (ref <18)

## 2020-07-16 LAB — MAGNESIUM: Magnesium: 1.5 mg/dL — ABNORMAL LOW (ref 1.7–2.4)

## 2020-07-16 LAB — URINALYSIS, ROUTINE W REFLEX MICROSCOPIC
Bilirubin Urine: NEGATIVE
Glucose, UA: 50 mg/dL — AB
Hgb urine dipstick: NEGATIVE
Ketones, ur: 5 mg/dL — AB
Leukocytes,Ua: NEGATIVE
Nitrite: NEGATIVE
Protein, ur: NEGATIVE mg/dL
Specific Gravity, Urine: 1.015 (ref 1.005–1.030)
pH: 5 (ref 5.0–8.0)

## 2020-07-16 LAB — ETHANOL: Alcohol, Ethyl (B): 10 mg/dL — ABNORMAL HIGH (ref <10)

## 2020-07-16 LAB — POC SARS CORONAVIRUS 2 AG -  ED: SARS Coronavirus 2 Ag: NEGATIVE

## 2020-07-16 MED ORDER — THIAMINE HCL 100 MG/ML IJ SOLN
100.0000 mg | Freq: Once | INTRAMUSCULAR | Status: AC
  Administered 2020-07-16: 100 mg via INTRAVENOUS
  Filled 2020-07-16: qty 2

## 2020-07-16 MED ORDER — MAGNESIUM SULFATE IN D5W 1-5 GM/100ML-% IV SOLN
1.0000 g | Freq: Once | INTRAVENOUS | Status: AC
  Administered 2020-07-16: 1 g via INTRAVENOUS
  Filled 2020-07-16: qty 100

## 2020-07-16 MED ORDER — SODIUM CHLORIDE 0.9 % IV BOLUS
1000.0000 mL | Freq: Once | INTRAVENOUS | Status: AC
  Administered 2020-07-16: 1000 mL via INTRAVENOUS

## 2020-07-16 NOTE — ED Triage Notes (Signed)
PCP sent pt here due to seizures.  Reported that pt was tachycardic at office.

## 2020-07-16 NOTE — ED Provider Notes (Signed)
Gloria Santana   CSN: [id number] Arrival date & time: 07/16/20  1045     History Chief Complaint  Patient presents with  . Seizures    Gloria Santana is a 65 y.o. female.  HPI      Gloria Santana is a 65 y.o. female with history of alcohol dependence, anemia, COPD, HTN who presents to the Emergency Department sent from PCP's office for evaluation of possible seizure that occurred 2 days ago.  Patient is accompanied to the emergency department by her daughter who witnessed the alleged seizure.  She states that she was sitting on a bedside commode when she suddenly slumped over and fell to her side striking her head on the edge of the door.  Daughter states she witnessed a 3 to 5-minute episode of patient's eyes "rolling back in her head and biting her lip."  She also describes a generalized stiffening of her body.  Daughter denies history of seizures.  Daughter states that she has a long history of alcohol abuse and typically drinks a 12-15 beers daily if available to her.  Last alcohol use was this morning and drank maybe one beer. Daughter also states that she has noticed a gradual decline in her mother's daily activities and she describes noticing difficulty with her mother standing or walking and is only able to stand for more than 1 to 2 minutes at a time.  She endorses a 20 pound weight loss in the last 2 months and loss of appetite.  She states that her PCP sent her here for evaluation of head injury and possible seizure.  Patient unable to provide any history and states that she may have had a seizure on Sunday but does not recall any details.  No known Covid exposures, she is unvaccinated for COVID-19.  Patient denies fever, chills, pain, nausea or vomiting, shortness of breath.    Past Medical History:  Diagnosis Date  . Alcohol dependence (HCC)   . Anemia   . COPD (chronic obstructive pulmonary disease) (HCC)   . Diabetes mellitus without  complication (HCC)    Pt states she is not diabetic  . Hypertension   . Poor circulation     Patient Active Problem List   Diagnosis Date Noted  . Hemothorax 09/13/2019  . Rib fracture 09/13/2019  . COPD (chronic obstructive pulmonary disease) (HCC) 09/13/2019  . Hemothorax, left 09/13/2019  . Fatty liver 07/13/2018  . Encounter for screening colonoscopy 07/13/2018    Past Surgical History:  Procedure Laterality Date  . ABDOMINAL HYSTERECTOMY    . VIDEO ASSISTED THORACOSCOPY (VATS)/EMPYEMA Left 09/13/2019   Procedure: VIDEO ASSISTED THORACOSCOPY (VATS) EVACUATION of HEMOTHORAX WITH INTERCOSTAL NERVE BLOCK;  Surgeon: Delight Ovens, MD;  Location[facility] OR;  Service: Thoracic;  Laterality: Left;  Marland Kitchen VIDEO BRONCHOSCOPY N/A 09/13/2019   Procedure: Video Bronchoscopy;  Surgeon: Delight Ovens, MD;  Location: [facility] OR;  Service: Thoracic;  Laterality: N/A;     OB History    Gravida  3   Para  3   Term  3   Preterm      AB      Living  3     SAB      IAB      Ectopic      Multiple      Live Births              Family History  Problem Relation Age of Onset  .  Diabetes Mother   . Colon cancer Neg Hx   . Colon polyps Neg Hx     Social History   Tobacco Use  . Smoking status: Current Every Day Smoker    Packs/day: 2.00    Years: 35.00    Pack years: 70.00    Types: Cigarettes  . Smokeless tobacco: Never Used  Vaping Use  . Vaping Use: Never used  Substance Use Topics  . Alcohol use: Yes    Alcohol/week: 14.0 standard drinks    Types: 14 Cans of beer per week    Comment: last drink this am  . Drug use: No    Home Medications Prior to Admission medications   Medication Sig Start Date End Date Taking? Authorizing Provider  acetaminophen (TYLENOL) 500 MG tablet Take 2 tablets (1,000 mg total) by mouth every 6 (six) hours. 09/16/19   Zannie Cove, MD  albuterol (VENTOLIN HFA) 108 (90 Base) MCG/ACT inhaler Inhale 1-2 puffs into the lungs every 6  (six) hours as needed for wheezing or shortness of breath.    [provider]  senna-docusate (SENOKOT-S) 8.6-50 MG tablet Take 1 tablet by mouth at bedtime. 09/16/19   Zannie Cove, MD    Allergies    Patient has no known allergies.  Review of Systems   Review of Systems  Constitutional: Positive for activity change, appetite change and unexpected weight change. Negative for chills, fatigue and fever.  HENT: Negative for congestion, sore throat and trouble swallowing.   Eyes: Negative for visual disturbance.  Respiratory: Negative for cough, chest tightness, shortness of breath and wheezing.   Cardiovascular: Negative for chest pain and palpitations.  Gastrointestinal: Negative for abdominal pain, diarrhea, nausea and vomiting.  Genitourinary: Negative for decreased urine volume, dysuria, flank pain and hematuria.  Musculoskeletal: Negative for arthralgias, myalgias, neck pain and neck stiffness.  Skin: Negative for rash.  Neurological: Positive for seizures and weakness (generalized weakness). Negative for dizziness and numbness.  Hematological: Does not bruise/bleed easily.    Physical Exam Updated Vital Signs BP 121/83   Pulse 99   Temp 98.7 F (37.1 C) (Oral)   Resp 16   Ht 5\' 4"  (1.626 m)   Wt 41.3 kg   LMP  (LMP Unknown)   SpO2 99%   BMI 15.62 kg/m   Physical Exam Vitals and nursing Santana reviewed.  Constitutional:      Appearance: Normal appearance.     Comments: Frail, elderly appearing  HENT:     Head: Normocephalic.     Comments: No abrasions of hematomas of the scalp    Mouth/Throat:     Mouth: Mucous membranes are dry.  Eyes:     Extraocular Movements: Extraocular movements intact.     Conjunctiva/sclera: Conjunctivae normal.     Pupils: Pupils are equal, round, and reactive to light.  Neck:     Thyroid: No thyromegaly.     Meningeal: Kernig's sign absent.  Cardiovascular:     Rate and Rhythm: Normal rate and regular rhythm.     Pulses:  Normal pulses.  Pulmonary:     Effort: Pulmonary effort is normal.     Breath sounds: Normal breath sounds. No wheezing.  Abdominal:     General: There is no distension.     Palpations: Abdomen is soft.     Tenderness: There is no abdominal tenderness. There is no guarding or rebound.  Musculoskeletal:        General: Normal range of motion.     Cervical  back: Normal range of motion.     Right lower leg: No edema.     Left lower leg: No edema.  Lymphadenopathy:     Cervical: No cervical adenopathy.  Skin:    General: Skin is warm.     Capillary Refill: Capillary refill takes less than 2 seconds.     Findings: No rash.  Neurological:     General: No focal deficit present.     Mental Status: She is alert.     Sensory: Sensation is intact.     Motor: Motor function is intact.     Coordination: Coordination is intact. Finger-Nose-Finger Test normal.     Comments: CN II-XII intact.  Speech slow but clear.  No pronator drift.       ED Results / Procedures / Treatments   Labs (all labs ordered are listed, but only abnormal results are displayed) Labs Reviewed  CBC WITH DIFFERENTIAL/PLATELET - Abnormal; Notable for the following components:      Result Value   WBC 2.9 (*)    RBC 3.74 (*)    Hemoglobin 11.7 (*)    HCT 32.9 (*)    Platelets 101 (*)    Lymphs Abs 0.6 (*)    All other components within normal limits  BASIC METABOLIC PANEL - Abnormal; Notable for the following components:   Sodium 127 (*)    Chloride 92 (*)    BUN 5 (*)    All other components within normal limits  ETHANOL - Abnormal; Notable for the following components:   Alcohol, Ethyl (B) 10 (*)    All other components within normal limits  SARS CORONAVIRUS 2 (TAT 6-24 HRS)  RAPID URINE DRUG SCREEN, HOSP PERFORMED  URINALYSIS, ROUTINE W REFLEX MICROSCOPIC  MAGNESIUM  POC SARS CORONAVIRUS 2 AG -  ED  TROPONIN I (HIGH SENSITIVITY)    EKG EKG Interpretation  Date/Time:  Tuesday July 16 2020  11:10:00 EST Ventricular Rate:  112 PR Interval:  142 QRS Duration: 60 QT Interval:  324 QTC Calculation: 442 R Axis:   52 Text Interpretation: Sinus tachycardia Possible Left atrial enlargement Septal infarct , age undetermined Abnormal ECG Since last tracing Rate faster Confirmed by Susy Frizzle [phone]) on 07/16/2020 12:11:08 PM   Radiology DG Chest 1 View  Result Date: 07/16/2020 CLINICAL DATA:  Cough. Additional history provided: COVID negative. History of hypertension, diabetes, COPD, smoker. EXAM: CHEST  1 VIEW COMPARISON:  Prior chest CT 05/28/2020. Prior chest radiographs 01/07/2020 and earlier. FINDINGS: Heart size within normal limits. The lungs are hyperinflated. No appreciable airspace consolidation or pulmonary edema. Redemonstrated scarring within the right lung base. Nipple shadows project over the lung bases bilaterally. No evidence of pleural effusion or pneumothorax. No acute bony abnormality identified. Chronic, healed left-sided rib fracture deformities. IMPRESSION: Findings of COPD without superimposed acute cardiopulmonary abnormality. Redemonstrated scarring within the right lung base. Electronically Signed   By: Jackey Loge DO   On: 07/16/2020 14:27   CT Head Wo Contrast  Result Date: 07/16/2020 CLINICAL DATA:  Seizure, nontraumatic. Additional history provided: Seizure prior to arrival, history of diabetes mellitus and COPD. EXAM: CT HEAD WITHOUT CONTRAST TECHNIQUE: Contiguous axial images were obtained from the base of the skull through the vertex without intravenous contrast. COMPARISON:  Prior head CT examinations 06/13/2020 and earlier. FINDINGS: Brain: Moderate cerebral and cerebellar atrophy. Mild ill-defined hypoattenuation within the cerebral white matter is nonspecific, but compatible with chronic small vessel ischemic disease. There is no acute intracranial hemorrhage. No demarcated  cortical infarct. No extra-axial fluid collection. No evidence of intracranial  mass. No midline shift. Vascular: No hyperdense vessel.  Atherosclerotic calcifications Skull: Normal. Negative for fracture or focal lesion. Sinuses/Orbits: Visualized orbits show no acute finding. Developmentally absent right frontal sinus. Pansinusitis with findings most notably as follows. Complete opacification of the left frontal sinus. Near complete opacification of the bilateral ethmoid air cells. Extensive partial opacification of the bilateral maxillary sinuses with mucosal thickening and frothy secretions. Other: Right mastoid effusion. IMPRESSION: No evidence of acute intracranial abnormality. Stable moderate generalized atrophy of the brain, advanced for age. Stable mild chronic small vessel ischemic disease. Pansinusitis with severe left frontal, bilateral ethmoid and bilateral maxillary sinus disease. Correlate for acute on chronic sinusitis. Right mastoid effusion. Electronically Signed   By: Jackey Loge DO   On: 07/16/2020 14:23    Procedures Procedures   Medications Ordered in ED Medications  sodium chloride 0.9 % bolus 1,000 mL (0 mLs Intravenous Stopped 07/16/20 1546)  thiamine (B-1) injection 100 mg (100 mg Intravenous Given 07/16/20 1429)  magnesium sulfate IVPB 1 g 100 mL (0 g Intravenous Stopped 07/16/20 1756)    ED Course  I have reviewed the triage vital signs and the nursing notes.  Pertinent labs & imaging results that were available during my care of the patient were reviewed by me and considered in my medical decision making (see chart for details).    MDM Rules/Calculators/A&P                          Patient with hx of alcohol abuse, sent here from PCP office for eval of possible seizure.  No hx of seizures. Drank small amt of beer earlier, last significant amt of alcohol 3 days ago.  On exam, pt frail appearing but does not appear post-ictal.  She is alert no focal neuro deficits.  Will obtain labs, CT head and administer IVF's  Labs interpreted by me, mild  hypomagnesemia. Hyponatremia and leukopenia which are similar to baseline. covid Ag negative.  PCR test pending. U/A reassuring.  CT head and CXR w/o acute findings.  Pt given thiamine and mag. EKG also w/o acute ischemic changes, trop reassuring.  Tachycardia resolved after fluids. Reported seizure felt to be secondary to alcohol use.    On recheck, pt has drank fluids, ate a snack.  Reports feeling better and requesting discharge home.  Discussed findings with patient and daughter who is at bedside.  Agrees to f/u with PCP and daughter reports home health is being scheduled by PCP.     Final Clinical Impression(s) / ED Diagnoses Final diagnoses:  Seizure-like activity (HCC)  Generalized weakness  Alcohol use    Rx / DC Orders ED Discharge Orders    None       Pauline Aus, PA-C 07/18/20 1102    Pollyann Savoy, MD 07/19/20 0930

## 2020-07-16 NOTE — ED Notes (Signed)
Patient off unit to CT at this time.  

## 2020-07-16 NOTE — Discharge Instructions (Addendum)
Follow-up with your primary care provider for recheck.  Please try to discontinue alcohol use.  I have provided a resource list for you to help with your alcohol use.

## 2020-07-17 LAB — SARS CORONAVIRUS 2 (TAT 6-24 HRS): SARS Coronavirus 2: POSITIVE — AB

## 2020-07-18 ENCOUNTER — Telehealth: Payer: Self-pay | Admitting: Infectious Diseases

## 2020-07-18 NOTE — Telephone Encounter (Signed)
Called to discuss with patient about COVID-19 symptoms and the use of one of the available treatments for those with mild to moderate Covid symptoms and at a high risk of hospitalization.  Pt appears to qualify for outpatient treatment due to co-morbid conditions and/or a member of an at-risk group in accordance with the FDA Emergency Use Authorization.    Symptom onset: unclear  Vaccinated: no Booster? no Immunocompromised? no Qualifiers: age, alcoholism, diabetes  Rapid Ag test was negative in ER, but PCR positive. New onset seizures that brought her to ER from PCP's office 2/1. Mostly her complaints are fatigue, poor appetite, and has noticed some intermittent diarrhea. Her daughter explains that she has had FTT in the setting of alcohol abuse. Very challenging to determine treatments available to her - will defer.  ER precautions discussed - no PNA on CXR in ER. No respiratory symptoms at all.  Information given to Post COVID clinic should she notice ongoing symptoms concerning for COVID beyond 2-3 weeks. I also asked her to call her mom's doctor to inform them.    Rexene Alberts, NP

## 2020-10-10 ENCOUNTER — Other Ambulatory Visit: Payer: Self-pay | Admitting: Registered Nurse

## 2020-10-10 DIAGNOSIS — Z1231 Encounter for screening mammogram for malignant neoplasm of breast: Secondary | ICD-10-CM

## 2020-10-10 DIAGNOSIS — E2839 Other primary ovarian failure: Secondary | ICD-10-CM

## 2021-03-15 ENCOUNTER — Encounter (HOSPITAL_COMMUNITY): Payer: Self-pay | Admitting: Emergency Medicine

## 2021-03-15 ENCOUNTER — Emergency Department (HOSPITAL_COMMUNITY)

## 2021-03-15 ENCOUNTER — Other Ambulatory Visit: Payer: Self-pay

## 2021-03-15 ENCOUNTER — Emergency Department (HOSPITAL_COMMUNITY)
Admission: EM | Admit: 2021-03-15 | Discharge: 2021-03-15 | Disposition: A | Discharge status: Home / Self Care | Attending: Emergency Medicine | Admitting: Emergency Medicine

## 2021-03-15 DIAGNOSIS — R4182 Altered mental status, unspecified: Secondary | ICD-10-CM | POA: Diagnosis present

## 2021-03-15 DIAGNOSIS — E876 Hypokalemia: Secondary | ICD-10-CM | POA: Insufficient documentation

## 2021-03-15 DIAGNOSIS — F0390 Unspecified dementia without behavioral disturbance: Secondary | ICD-10-CM | POA: Insufficient documentation

## 2021-03-15 DIAGNOSIS — E119 Type 2 diabetes mellitus without complications: Secondary | ICD-10-CM | POA: Insufficient documentation

## 2021-03-15 DIAGNOSIS — R569 Unspecified convulsions: Secondary | ICD-10-CM | POA: Diagnosis not present

## 2021-03-15 DIAGNOSIS — I10 Essential (primary) hypertension: Secondary | ICD-10-CM | POA: Insufficient documentation

## 2021-03-15 DIAGNOSIS — F17210 Nicotine dependence, cigarettes, uncomplicated: Secondary | ICD-10-CM | POA: Insufficient documentation

## 2021-03-15 DIAGNOSIS — J449 Chronic obstructive pulmonary disease, unspecified: Secondary | ICD-10-CM | POA: Diagnosis not present

## 2021-03-15 HISTORY — DX: Fatty (change of) liver, not elsewhere classified: K76.0

## 2021-03-15 HISTORY — DX: Alcohol dependence with alcohol-induced persisting dementia: F10.27

## 2021-03-15 HISTORY — DX: Acute pancreatitis without necrosis or infection, unspecified: K85.90

## 2021-03-15 LAB — COMPREHENSIVE METABOLIC PANEL
ALT: 21 U/L (ref 0–44)
AST: 57 U/L — ABNORMAL HIGH (ref 15–41)
Albumin: 3.6 g/dL (ref 3.5–5.0)
Alkaline Phosphatase: 124 U/L (ref 38–126)
Anion gap: 11 (ref 5–15)
BUN: <5 mg/dL — ABNORMAL LOW (ref 8–23)
CO2: 25 mmol/L (ref 22–32)
Calcium: 8.7 mg/dL — ABNORMAL LOW (ref 8.9–10.3)
Chloride: 98 mmol/L (ref 98–111)
Creatinine, Ser: 0.4 mg/dL — ABNORMAL LOW (ref 0.44–1.00)
GFR, Estimated: >60 mL/min (ref >60)
Glucose, Bld: 86 mg/dL (ref 70–99)
Potassium: 3.4 mmol/L — ABNORMAL LOW (ref 3.5–5.1)
Sodium: 134 mmol/L — ABNORMAL LOW (ref 135–145)
Total Bilirubin: 0.7 mg/dL (ref 0.3–1.2)
Total Protein: 7.3 g/dL (ref 6.5–8.1)

## 2021-03-15 LAB — CBC WITH DIFFERENTIAL/PLATELET
Abs Immature Granulocytes: 0.01 10*3/uL (ref 0.00–0.07)
Basophils Absolute: 0 10*3/uL (ref 0.0–0.1)
Basophils Relative: 1 %
Eosinophils Absolute: 0.1 10*3/uL (ref 0.0–0.5)
Eosinophils Relative: 2 %
HCT: 32.4 % — ABNORMAL LOW (ref 36.0–46.0)
Hemoglobin: 11.7 g/dL — ABNORMAL LOW (ref 12.0–15.0)
Immature Granulocytes: 1 %
Lymphocytes Relative: 54 %
Lymphs Abs: 1.1 10*3/uL (ref 0.7–4.0)
MCH: 32.8 pg (ref 26.0–34.0)
MCHC: 36.1 g/dL — ABNORMAL HIGH (ref 30.0–36.0)
MCV: 90.8 fL (ref 80.0–100.0)
Monocytes Absolute: 0.4 10*3/uL (ref 0.1–1.0)
Monocytes Relative: 17 %
Neutro Abs: 0.5 10*3/uL — ABNORMAL LOW (ref 1.7–7.7)
Neutrophils Relative %: 25 %
Platelets: 281 10*3/uL (ref 150–400)
RBC: 3.57 MIL/uL — ABNORMAL LOW (ref 3.87–5.11)
RDW: 13.2 % (ref 11.5–15.5)
WBC: 2.1 10*3/uL — ABNORMAL LOW (ref 4.0–10.5)
nRBC: 0 % (ref 0.0–0.2)

## 2021-03-15 LAB — URINALYSIS, ROUTINE W REFLEX MICROSCOPIC
Bilirubin Urine: NEGATIVE
Glucose, UA: NEGATIVE mg/dL
Hgb urine dipstick: NEGATIVE
Ketones, ur: NEGATIVE mg/dL
Leukocytes,Ua: NEGATIVE
Nitrite: NEGATIVE
Protein, ur: NEGATIVE mg/dL
Specific Gravity, Urine: 1.005 (ref 1.005–1.030)
pH: 5 (ref 5.0–8.0)

## 2021-03-15 LAB — MAGNESIUM: Magnesium: 1.5 mg/dL — ABNORMAL LOW (ref 1.7–2.4)

## 2021-03-15 LAB — CBG MONITORING, ED: Glucose-Capillary: 77 mg/dL (ref 70–99)

## 2021-03-15 LAB — AMMONIA: Ammonia: <10 umol/L (ref 9–35)

## 2021-03-15 MED ORDER — POTASSIUM CHLORIDE 10 MEQ/100ML IV SOLN
10.0000 meq | Freq: Once | INTRAVENOUS | Status: AC
  Administered 2021-03-15: 10 meq via INTRAVENOUS
  Filled 2021-03-15: qty 100

## 2021-03-15 MED ORDER — LACTATED RINGERS IV BOLUS
1000.0000 mL | Freq: Once | INTRAVENOUS | Status: AC
  Administered 2021-03-15: 1000 mL via INTRAVENOUS

## 2021-03-15 MED ORDER — POTASSIUM CHLORIDE CRYS ER 20 MEQ PO TBCR
40.0000 meq | EXTENDED_RELEASE_TABLET | Freq: Once | ORAL | Status: AC
  Administered 2021-03-15: 40 meq via ORAL
  Filled 2021-03-15: qty 4

## 2021-03-15 MED ORDER — POTASSIUM CHLORIDE CRYS ER 20 MEQ PO TBCR: 40.0000 meq | EXTENDED_RELEASE_TABLET | Freq: Two times a day (BID) | ORAL | 0 refills | Status: DC

## 2021-03-15 MED ORDER — MAGNESIUM OXIDE 400 MG PO TABS
400.0000 mg | ORAL_TABLET | Freq: Every day | ORAL | 0 refills | Status: AC
Start: 2021-03-15 — End: 2021-03-22

## 2021-03-15 MED ORDER — MAGNESIUM SULFATE 2 GM/50ML IV SOLN
2.0000 g | Freq: Once | INTRAVENOUS | Status: AC
  Administered 2021-03-15: 2 g via INTRAVENOUS
  Filled 2021-03-15: qty 50

## 2021-03-15 NOTE — ED Provider Notes (Signed)
[facility] EMERGENCY DEPARTMENT Provider Note   CSN: [id number] Arrival date & time: 03/15/21  0258     History Chief Complaint  Patient presents with   Seizure like spells    Gloria Santana is a 65 y.o. female.  65 year old female who presents the emergency room today for multiple episodes of altered mental status.  Patient has dementia is on hospice for that however the daughter gives a history that she had 3 episodes last 24 hours where she will look up and purse her lips very tightly for a few seconds and be unresponsive.  She will become responsive relatively quickly afterwards and then she will be at her baseline mental status.  She has had 3 of these episodes one was witnessed by the patient's other daughter and 2 were witnessed by the daughter is in the room.  Has not been ill recently.  No fevers, cough, nausea, vomiting, diarrhea or constipation.  She has had a couple episodes of epistaxis but the most recent was a week ago.  No trauma that we know of.  She did have episodes similar to this earlier in the year but not as often as she is having them now.       Past Medical History:  Diagnosis Date   Alcohol dependence (HCC)    Alcoholic dementia (HCC)    Anemia    COPD (chronic obstructive pulmonary disease) (HCC)    Diabetes mellitus without complication (HCC)    Pt states she is not diabetic   Fatty liver    Hypertension    Pancreatitis    Poor circulation     Patient Active Problem List   Diagnosis Date Noted   Hemothorax 09/13/2019   Rib fracture 09/13/2019   COPD (chronic obstructive pulmonary disease) (HCC) 09/13/2019   Hemothorax, left 09/13/2019   Fatty liver 07/13/2018   Encounter for screening colonoscopy 07/13/2018    Past Surgical History:  Procedure Laterality Date   ABDOMINAL HYSTERECTOMY     VIDEO ASSISTED THORACOSCOPY (VATS)/EMPYEMA Left 09/13/2019   Procedure: VIDEO ASSISTED THORACOSCOPY (VATS) EVACUATION of HEMOTHORAX WITH INTERCOSTAL  NERVE BLOCK;  Surgeon: Delight Ovens, MD;  Location: [facility] OR;  Service: Thoracic;  Laterality: Left;   VIDEO BRONCHOSCOPY N/A 09/13/2019   Procedure: Video Bronchoscopy;  Surgeon: Delight Ovens, MD;  Location: [facility] OR;  Service: Thoracic;  Laterality: N/A;     OB History     Gravida  3   Para  3   Term  3   Preterm      AB      Living  3      SAB      IAB      Ectopic      Multiple      Live Births              Family History  Problem Relation Age of Onset   Diabetes Mother    Colon cancer Neg Hx    Colon polyps Neg Hx     Social History   Tobacco Use   Smoking status: Every Day    Packs/day: 2.00    Years: 35.00    Pack years: 70.00    Types: Cigarettes   Smokeless tobacco: Never  Vaping Use   Vaping Use: Never used  Substance Use Topics   Alcohol use: Yes    Alcohol/week: 14.0 standard drinks    Types: 14 Cans of beer per week    Comment: last drink  this am   Drug use: No    Home Medications Prior to Admission medications   Medication Sig Start Date End Date Taking? Authorizing Provider  magnesium oxide (MAG-OX) 400 MG tablet Take 1 tablet (400 mg total) by mouth daily for 7 days. 03/15/21 03/22/21 Yes Violet Cart, Barbara Cower, MD  potassium chloride SA (KLOR-CON) 20 MEQ tablet Take 2 tablets (40 mEq total) by mouth 2 (two) times daily for 7 days. 03/15/21 03/22/21 Yes Helem Reesor, Barbara Cower, MD  acetaminophen (TYLENOL) 500 MG tablet Take 2 tablets (1,000 mg total) by mouth every 6 (six) hours. 09/16/19   Zannie Cove, MD  albuterol (VENTOLIN HFA) 108 (90 Base) MCG/ACT inhaler Inhale 1-2 puffs into the lungs every 6 (six) hours as needed for wheezing or shortness of breath.    [provider]  senna-docusate (SENOKOT-S) 8.6-50 MG tablet Take 1 tablet by mouth at bedtime. 09/16/19   Zannie Cove, MD    Allergies    Patient has no known allergies.  Review of Systems   Review of Systems  All other systems reviewed and are negative.  Physical  Exam Updated Vital Signs BP (!) 127/97   Pulse 82   Temp 98.2 F (36.8 C) (Oral)   Resp 16   Ht 5\' 4"  (1.626 m)   Wt 50.7 kg   LMP  (LMP Unknown)   SpO2 100%   BMI 19.19 kg/m   Physical Exam Vitals and nursing note reviewed.  Constitutional:      Appearance: She is well-developed.  HENT:     Head: Normocephalic and atraumatic.     Mouth/Throat:     Mouth: Mucous membranes are moist.  Eyes:     Pupils: Pupils are equal, round, and reactive to light.  Cardiovascular:     Rate and Rhythm: Normal rate and regular rhythm.  Pulmonary:     Effort: Pulmonary effort is normal. No respiratory distress.     Breath sounds: No stridor.  Abdominal:     General: Abdomen is flat. There is no distension.  Musculoskeletal:     Cervical back: Normal range of motion.  Skin:    General: Skin is warm and dry.  Neurological:     General: No focal deficit present.     Mental Status: She is alert and oriented to person, place, and time.    ED Results / Procedures / Treatments   Labs (all labs ordered are listed, but only abnormal results are displayed) Labs Reviewed  CBC WITH DIFFERENTIAL/PLATELET - Abnormal; Notable for the following components:      Result Value   WBC 2.1 (*)    RBC 3.57 (*)    Hemoglobin 11.7 (*)    HCT 32.4 (*)    MCHC 36.1 (*)    Neutro Abs 0.5 (*)    All other components within normal limits  COMPREHENSIVE METABOLIC PANEL - Abnormal; Notable for the following components:   Sodium 134 (*)    Potassium 3.4 (*)    BUN <5 (*)    Creatinine, Ser 0.40 (*)    Calcium 8.7 (*)    AST 57 (*)    All other components within normal limits  MAGNESIUM - Abnormal; Notable for the following components:   Magnesium 1.5 (*)    All other components within normal limits  AMMONIA  URINALYSIS, ROUTINE W REFLEX MICROSCOPIC  CBG MONITORING, ED    EKG EKG Interpretation  Date/Time:  Saturday March 15 2021 03:38:55 EDT Ventricular Rate:  83 PR Interval:  175  QRS  Duration: 94 QT Interval:  425 QTC Calculation: 500 R Axis:   44 Text Interpretation: Sinus rhythm Low voltage, extremity leads Abnormal R-wave progression, early transition Borderline prolonged QT interval Confirmed by Marily Memos [phone]) on 03/15/2021 5:28:19 AM  Radiology DG Chest 2 View  Result Date: 03/15/2021 CLINICAL DATA:  Acute mental status change.  Current smoker. EXAM: CHEST - 2 VIEW COMPARISON:  July 16, 2020 FINDINGS: No pneumothorax. The cardiomediastinal silhouette is stable. A left-sided nipple shadow is incidentally identified. No suspicious nodules or masses. Hyperinflation in the lungs identified. Mild atelectasis in the left base. No worrisome infiltrate. IMPRESSION: 1. Hyperinflation of the lungs consistent with emphysematous changes. 2. No acute abnormalities. Electronically Signed   By: Gerome Sam III M.D.   On: 03/15/2021 05:09   CT Head Wo Contrast  Result Date: 03/15/2021 CLINICAL DATA:  Mental status change with unknown cause EXAM: CT HEAD WITHOUT CONTRAST TECHNIQUE: Contiguous axial images were obtained from the base of the skull through the vertex without intravenous contrast. COMPARISON:  07/16/2020 FINDINGS: Brain: No evidence of acute infarction, hemorrhage, hydrocephalus, extra-axial collection or mass lesion/mass effect. Generalized cortical atrophy. Vascular: No hyperdense vessel or unexpected calcification. Skull: Normal. Negative for fracture or focal lesion. Sinuses/Orbits: Partial right mastoid opacification, chronic or recurrent based on comparison. Negative nasopharynx. IMPRESSION: 1. No acute finding or specific cause for symptoms. 2. Brain atrophy. Electronically Signed   By: Tiburcio Pea M.D.   On: 03/15/2021 04:57    Procedures Procedures   Medications Ordered in ED Medications  lactated ringers bolus 1,000 mL (0 mLs Intravenous Stopped 03/15/21 0621)  magnesium sulfate IVPB 2 g 50 mL (0 g Intravenous Stopped 03/15/21 0653)  potassium  chloride SA (KLOR-CON) CR tablet 40 mEq (40 mEq Oral Given 03/15/21 0551)  potassium chloride 10 mEq in 100 mL IVPB (0 mEq Intravenous Stopped 03/15/21 7425)    ED Course  I have reviewed the triage vital signs and the nursing notes.  Pertinent labs & imaging results that were available during my care of the patient were reviewed by me and considered in my medical decision making (see chart for details).    MDM Rules/Calculators/A&P                         Syncope versus seizure work-up.  Overall unclear etiology as it has some components of seizure like activity but also some components of syncope.  Patient is at her baseline mental status now so we will continue to observe  Found to have hypoK and hypomag. Treated for same. Will continue to replete at home fu w/ neurology.   Final Clinical Impression(s) / ED Diagnoses Final diagnoses:  Hypokalemia  Hypomagnesemia  Seizure-like activity (HCC)    Rx / DC Orders ED Discharge Orders          Ordered    potassium chloride SA (KLOR-CON) 20 MEQ tablet  2 times daily        03/15/21 0652    magnesium oxide (MAG-OX) 400 MG tablet  Daily        03/15/21 0652    Ambulatory referral to Neurology       Comments: An appointment is requested in approximately: 1 week   03/15/21 0654             Ambyr Qadri, Barbara Cower, MD 03/15/21 [phone]

## 2021-03-15 NOTE — ED Notes (Signed)
Pt refusing In and Out Cath at this time. EDP Notified. Pt states she wants to try the Purewick first.

## 2021-03-15 NOTE — ED Notes (Signed)
Patient transported to CT 

## 2021-03-15 NOTE — ED Triage Notes (Signed)
Pt's daughter states pt had 3 seizure-like episodes today where she "locks up and shakes". States this is not new for pt. Daughter states pt has never been diagnosed with seizures but that "they tried to tell her it was due to pt's ETOH abuse" but daughter does not believe that. Per daughter, pt still heavy drinker (several 40's a day).

## 2021-03-20 ENCOUNTER — Encounter: Payer: Self-pay | Admitting: Neurology

## 2021-03-20 ENCOUNTER — Ambulatory Visit (INDEPENDENT_AMBULATORY_CARE_PROVIDER_SITE_OTHER): Admitting: Neurology

## 2021-03-20 VITALS — BP 131/88 | HR 118

## 2021-03-20 DIAGNOSIS — G40909 Epilepsy, unspecified, not intractable, without status epilepticus: Secondary | ICD-10-CM

## 2021-03-20 MED ORDER — KEPPRA 500 MG PO TABS: 500.0000 mg | ORAL_TABLET | Freq: Two times a day (BID) | ORAL | 4 refills | Status: DC

## 2021-03-20 NOTE — Patient Instructions (Signed)
Continue Keppra 500 mg twice daily  Will check a Keppra level today  Brain MRI with and without contrast  Routine EEG  Return 3 months     Per [facility] statutes, patients with seizures are not allowed to drive until they have been seizure-free for six months.  Other recommendations include using caution when using heavy equipment or power tools. Avoid working on ladders or at heights. Take showers instead of baths.  Do not swim alone.  Ensure the water temperature is not too high on the home water heater. Do not go swimming alone. Do not lock yourself in a room alone (i.e. bathroom). When caring for infants or small children, sit down when holding, feeding, or changing them to minimize risk of injury to the child in the event you have a seizure. Maintain good sleep hygiene. Avoid alcohol.  Also recommend adequate sleep, hydration, good diet and minimize stress.   During the Seizure  - First, ensure adequate ventilation and place patients on the floor on their left side  Loosen clothing around the neck and ensure the airway is patent. If the patient is clenching the teeth, do not force the mouth open with any object as this can cause severe damage - Remove all items from the surrounding that can be hazardous. The patient may be oblivious to what's happening and may not even know what he or she is doing. If the patient is confused and wandering, either gently guide him/her away and block access to outside areas - Reassure the individual and be comforting - Call 911. In most cases, the seizure ends before EMS arrives. However, there are cases when seizures may last over 3 to 5 minutes. Or the individual may have developed breathing difficulties or severe injuries. If a pregnant patient or a person with diabetes develops a seizure, it is prudent to call an ambulance. - Finally, if the patient does not regain full consciousness, then call EMS. Most patients will remain confused for about 45  to 90 minutes after a seizure, so you must use judgment in calling for help. - Avoid restraints but make sure the patient is in a bed with padded side rails - Place the individual in a lateral position with the neck slightly flexed; this will help the saliva drain from the mouth and prevent the tongue from falling backward - Remove all nearby furniture and other hazards from the area - Provide verbal assurance as the individual is regaining consciousness - Provide the patient with privacy if possible - Call for help and start treatment as ordered by the caregiver   After the Seizure (Postictal Stage)  After a seizure, most patients experience confusion, fatigue, muscle pain and/or a headache. Thus, one should permit the individual to sleep. For the next few days, reassurance is essential. Being calm and helping reorient the person is also of importance.  Most seizures are painless and end spontaneously. Seizures are not harmful to others but can lead to complications such as stress on the lungs, brain and the heart. Individuals with prior lung problems may develop labored breathing and respiratory distress.

## 2021-03-20 NOTE — Progress Notes (Signed)
[facility]  PATIENT: Gloria Santana DOB: 1956/05/19  REFERRING CLINICIAN: Mesner, Barbara Cower, MD HISTORY FROM: Daughter  REASON FOR VISIT: Seizure   HISTORICAL  CHIEF COMPLAINT:  Chief Complaint  Patient presents with   New Patient (Initial Visit)    Room 16 w/ daughter, Sharee Holster. ED follow up for seizure-like activity. She started levetiracetam 500mg , one tab BID on 03/15/21.    HISTORY OF PRESENT ILLNESS:  This is a 65 year old woman with past medical history of chronic alcoholism, dementia, currently on home hospice who is presenting with seizure-like activity.  History obtained from daughter as patient is demented.  Daughter reported episode started in the beginning of the year where she noted patient having a blank stare and will lock and extend her upper extremities.  Those episodes would last 2 to 3-minutes and she has to come in call her name or shake her to get her out of it.  They did not seek evaluation for those specific episodes.  Daughter said that in the past month patient started having new episode of staring associated with facial twitching and mouth twitching she mentioned on October 1 she had a few episodes therefore she was concerned and took her to the ED.  In the ED.  Head CT did not show any acute finding just brain atrophy, and labs noted hypokalemia.  She was discharged home but patient continued to have the same episodes and the next day had a total of 9 episodes concerning for seizure.  They contacted the hospice provider who started the patient on levetiracetam 500 mg twice daily and also lorazepam as needed for these episodes.  Patient started the Keppra and the next day she had a few episodes 2-3 and in the past 3 days she has not no episode. In regard to her alcoholism daughter noted that patient has been drinking throughout her adult life.  She only drinks beer and she can drink 3 to 4 X 40 ounce beer daily.  She continues to drink and  currently she will have 1-2 beer bottles daily.   Handedness: Right   Seizure Type: Staring, facial and mouth twitching, then lean forward   Current frequency: Was having up to 9 episodes per day, now started Keppra none in the past few days   Any injuries from seizures: None   Seizure risk factors: Chronic alcoholism, cerebral atrophy   Previous ASMs: None   Currenty ASMs: Levetiracetam 500 mg BID   ASMs side effects: None   Brain Images: Head CT: No acute finding or specific cause for symptoms. 2. Brain atrophy  Previous EEGs: N/A    OTHER MEDICAL CONDITIONS: Chronic alcoholism, malnutrition, HTN, pancreatitis fatty liver.   REVIEW OF SYSTEMS: Full 14 system review of systems performed and negative with exception of: unable to fully obtain   ALLERGIES: No Known Allergies  HOME MEDICATIONS: Outpatient Medications Prior to Visit  Medication Sig Dispense Refill   acetaminophen (TYLENOL) 500 MG tablet Take 2 tablets (1,000 mg total) by mouth every 6 (six) hours. 30 tablet 0   albuterol (VENTOLIN HFA) 108 (90 Base) MCG/ACT inhaler Inhale 1-2 puffs into the lungs every 6 (six) hours as needed for wheezing or shortness of breath.     LORazepam (ATIVAN) 1 MG tablet Take 0.5-1 tablets by mouth daily as needed.     magnesium oxide (MAG-OX) 400 MG tablet Take 1 tablet (400 mg total) by mouth daily for 7 days. 7 tablet 0   potassium chloride SA (  KLOR-CON) 20 MEQ tablet Take 2 tablets (40 mEq total) by mouth 2 (two) times daily for 7 days. 28 tablet 0   KEPPRA 500 MG tablet Take 500 mg by mouth 2 (two) times daily.     senna-docusate (SENOKOT-S) 8.6-50 MG tablet Take 1 tablet by mouth at bedtime. 10 tablet 0   No facility-administered medications prior to visit.    PAST MEDICAL HISTORY: Past Medical History:  Diagnosis Date   Alcohol dependence (HCC)    Alcoholic dementia (HCC)    Anemia    COPD (chronic obstructive pulmonary disease) (HCC)    Diabetes mellitus without  complication (HCC)    Pt states she is not diabetic   Fatty liver    Hypertension    Pancreatitis    Poor circulation     PAST SURGICAL HISTORY: Past Surgical History:  Procedure Laterality Date   ABDOMINAL HYSTERECTOMY     VIDEO ASSISTED THORACOSCOPY (VATS)/EMPYEMA Left 09/13/2019   Procedure: VIDEO ASSISTED THORACOSCOPY (VATS) EVACUATION of HEMOTHORAX WITH INTERCOSTAL NERVE BLOCK;  Surgeon: Delight Ovens, MD;  Location: [facility] OR;  Service: Thoracic;  Laterality: Left;   VIDEO BRONCHOSCOPY N/A 09/13/2019   Procedure: Video Bronchoscopy;  Surgeon: Delight Ovens, MD;  Location: [facility] OR;  Service: Thoracic;  Laterality: N/A;    FAMILY HISTORY: Family History  Problem Relation Age of Onset   Diabetes Mother    Lung cancer Father    Colon cancer Neg Hx    Colon polyps Neg Hx     SOCIAL HISTORY: Social History   Socioeconomic History   Marital status: Legally Separated    Spouse name: Not on file   Number of children: 3   Years of education: 12   Highest education level: High school graduate  Occupational History   Occupation: Retired  Tobacco Use   Smoking status: Every Day    Packs/day: 2.50    Years: 35.00    Pack years: 87.50    Types: Cigarettes   Smokeless tobacco: Never  Vaping Use   Vaping Use: Never used  Substance and Sexual Activity   Alcohol use: Yes    Comment: 20-40 ounces of beer daily   Drug use: No   Sexual activity: Yes    Birth control/protection: Surgical    Comment: Hysterectomy  Other Topics Concern   Not on file  Social History Narrative   Lives with husband.   Right-handed.   No daily caffeine use.   Social Determinants of Health   Financial Resource Strain: Not on file  Food Insecurity: Not on file  Transportation Needs: Not on file  Physical Activity: Not on file  Stress: Not on file  Social Connections: Not on file  Intimate Partner Violence: Not on file     PHYSICAL EXAM  GENERAL EXAM/CONSTITUTIONAL: Vitals:   Vitals:   03/20/21 1058  BP: 131/88  Pulse: (!) 118   There is no height or weight on file to calculate BMI. Wt Readings from Last 3 Encounters:  03/15/21 111 lb 12.4 oz (50.7 kg)  07/16/20 91 lb (41.3 kg)  02/17/20 100 lb 15.5 oz (45.8 kg)   Patient is in no distress; well developed, nourished and groomed; neck is supple  EYES: Pupils round and reactive to light, Visual fields full to confrontation, Extraocular movements intacts,  No results found.  MUSCULOSKELETAL: Gait, strength, tone, movements noted in Neurologic exam below  NEUROLOGIC: MENTAL STATUS:  No flowsheet data found. awake, alert, oriented to person, not place or time,  naming and repetition intact.   CRANIAL NERVE:  2nd, 3rd, 4th, 6th - pupils equal and reactive to light, visual fields full to confrontation, extraocular muscles intact, no nystagmus 5th - facial sensation symmetric 7th - facial strength symmetric 8th - hearing intact 9th - palate elevates symmetrically, uvula midline 11th - shoulder shrug symmetric 12th - tongue protrusion midline  MOTOR:  Decrease bulk, upper and lower extremities at least anti gravity.   COORDINATION:  finger-nose-finger normal   GAIT/STATION:  Deferred      DIAGNOSTIC DATA (LABS, IMAGING, TESTING) - I reviewed patient records, labs, notes, testing and imaging myself where available.  Lab Results  Component Value Date   WBC 2.1 (L) 03/15/2021   HGB 11.7 (L) 03/15/2021   HCT 32.4 (L) 03/15/2021   MCV 90.8 03/15/2021   PLT 281 03/15/2021      Component Value Date/Time   NA 134 (L) 03/15/2021 0346   K 3.4 (L) 03/15/2021 0346   CL 98 03/15/2021 0346   CO2 25 03/15/2021 0346   GLUCOSE 86 03/15/2021 0346   BUN <5 (L) 03/15/2021 0346   CREATININE 0.40 (L) 03/15/2021 0346   CALCIUM 8.7 (L) 03/15/2021 0346   PROT 7.3 03/15/2021 0346   ALBUMIN 3.6 03/15/2021 0346   AST 57 (H) 03/15/2021 0346   ALT 21 03/15/2021 0346   ALKPHOS 124 03/15/2021 0346    BILITOT 0.7 03/15/2021 0346   GFRNONAA >60 03/15/2021 0346   GFRAA >60 02/17/2020 2304   No results found for: CHOL, HDL, LDLCALC, LDLDIRECT, TRIG Lab Results  Component Value Date   HGBA1C 4.5 (L) 09/12/2019   Lab Results  Component Value Date   VITAMINB12 500 09/15/2019   No results found for: TSH   Head CT  1.No acute finding or specific cause for symptoms. 2. Brain atrophy  I personally reviewed brain Images and previous EEG reports.   ASSESSMENT AND PLAN  65 y.o. year old female  with chronic alcoholism complicated by dementia, malnutrition, pancreatitis and fatty liver who is presenting with episodes concerning for seizures.  She does have seizure risk factors including alcoholism and brain atrophy.  She is currently on levetiracetam 500 mg twice daily.  I will obtain a level today and I will also obtain a brain MRI with and without contrast and a EEG.  I will see the patient in 3 months for follow-up.   1. Seizure disorder (HCC)     PLAN: Continue Keppra 500 mg twice daily  Will check a Keppra level today  Brain MRI with and without contrast  Routine EEG  Return 3 months    Per [facility] statutes, patients with seizures are not allowed to drive until they have been seizure-free for six months.  Other recommendations include using caution when using heavy equipment or power tools. Avoid working on ladders or at heights. Take showers instead of baths.  Do not swim alone.  Ensure the water temperature is not too high on the home water heater. Do not go swimming alone. Do not lock yourself in a room alone (i.e. bathroom). When caring for infants or small children, sit down when holding, feeding, or changing them to minimize risk of injury to the child in the event you have a seizure. Maintain good sleep hygiene. Avoid alcohol.  Also recommend adequate sleep, hydration, good diet and minimize stress.   During the Seizure  - First, ensure adequate ventilation and  place patients on the floor on their left side  Loosen clothing around the neck and ensure the airway is patent. If the patient is clenching the teeth, do not force the mouth open with any object as this can cause severe damage - Remove all items from the surrounding that can be hazardous. The patient may be oblivious to what's happening and may not even know what he or she is doing. If the patient is confused and wandering, either gently guide him/her away and block access to outside areas - Reassure the individual and be comforting - Call 911. In most cases, the seizure ends before EMS arrives. However, there are cases when seizures may last over 3 to 5 minutes. Or the individual may have developed breathing difficulties or severe injuries. If a pregnant patient or a person with diabetes develops a seizure, it is prudent to call an ambulance. - Finally, if the patient does not regain full consciousness, then call EMS. Most patients will remain confused for about 45 to 90 minutes after a seizure, so you must use judgment in calling for help. - Avoid restraints but make sure the patient is in a bed with padded side rails - Place the individual in a lateral position with the neck slightly flexed; this will help the saliva drain from the mouth and prevent the tongue from falling backward - Remove all nearby furniture and other hazards from the area - Provide verbal assurance as the individual is regaining consciousness - Provide the patient with privacy if possible - Call for help and start treatment as ordered by the caregiver   After the Seizure (Postictal Stage)  After a seizure, most patients experience confusion, fatigue, muscle pain and/or a headache. Thus, one should permit the individual to sleep. For the next few days, reassurance is essential. Being calm and helping reorient the person is also of importance.  Most seizures are painless and end spontaneously. Seizures are not harmful to  others but can lead to complications such as stress on the lungs, brain and the heart. Individuals with prior lung problems may develop labored breathing and respiratory distress.     Orders Placed This Encounter  Procedures   MR BRAIN W WO CONTRAST   Levetiracetam level   EEG adult     Meds ordered this encounter  Medications   KEPPRA 500 MG tablet    Sig: Take 1 tablet (500 mg total) by mouth 2 (two) times daily.    Dispense:  180 tablet    Refill:  4     Return in about 3 months (around 06/20/2021).    Windell Norfolk, MD 03/20/2021, 11:42 AM  [facility] [address] [place], [place] [address] [phone]

## 2021-03-21 LAB — LEVETIRACETAM LEVEL: Levetiracetam Lvl: 29.2 ug/mL (ref 10.0–40.0)

## 2021-03-24 ENCOUNTER — Telehealth: Payer: Self-pay

## 2021-03-24 ENCOUNTER — Telehealth: Payer: Self-pay | Admitting: Neurology

## 2021-03-24 NOTE — Telephone Encounter (Signed)
-----   Message from Windell Norfolk, MD sent at 03/24/2021  8:23 AM EDT ----- Please call and advise the patient that the recent Keppra level is within normal limits. No further action is required on these tests at this time. Please remind patient to keep any upcoming appointments or tests and to call us with any interim questions, concerns, problems or updates. Thanks,   Windell Norfolk, MD

## 2021-03-24 NOTE — Telephone Encounter (Signed)
Pt verified by name and DOB,  normal results given per provider, pt voiced understanding all question answered. °

## 2021-03-24 NOTE — Telephone Encounter (Signed)
medicaid order sent to [facility], [facility] they will reach out to the patient to schedule.  

## 2021-03-26 ENCOUNTER — Other Ambulatory Visit: Payer: Self-pay

## 2021-03-26 ENCOUNTER — Encounter (HOSPITAL_COMMUNITY): Payer: Self-pay | Admitting: *Deleted

## 2021-03-26 ENCOUNTER — Emergency Department (HOSPITAL_COMMUNITY)
Admission: EM | Admit: 2021-03-26 | Discharge: 2021-03-26 | Disposition: A | Discharge status: Home / Self Care | Attending: Emergency Medicine | Admitting: Emergency Medicine

## 2021-03-26 DIAGNOSIS — Z5321 Procedure and treatment not carried out due to patient leaving prior to being seen by health care provider: Secondary | ICD-10-CM | POA: Insufficient documentation

## 2021-03-26 DIAGNOSIS — R531 Weakness: Secondary | ICD-10-CM | POA: Insufficient documentation

## 2021-03-26 DIAGNOSIS — F0390 Unspecified dementia without behavioral disturbance: Secondary | ICD-10-CM | POA: Diagnosis not present

## 2021-03-26 NOTE — ED Triage Notes (Signed)
EMS reports that family called EMS for unconscious which pt was not.  Family concerned that pt may have had a seizure, EMS reports that pt did not have a seizure.  Pt was laying in the bed once EMS arrived.  Reported that pt has dementia and pt had been drinking some tonight.

## 2021-03-26 NOTE — ED Notes (Signed)
Received call from registration stating family came in and took pt back home. Family stated "We will just take her home to die in peace." Pt is currently a hospice pt.

## 2021-04-09 ENCOUNTER — Ambulatory Visit (INDEPENDENT_AMBULATORY_CARE_PROVIDER_SITE_OTHER): Admitting: Neurology

## 2021-04-09 DIAGNOSIS — G40909 Epilepsy, unspecified, not intractable, without status epilepticus: Secondary | ICD-10-CM | POA: Diagnosis not present

## 2021-04-09 NOTE — Procedures (Signed)
    History:  5 yof with chronic alcoholism and seizure like activity   EEG classification: Awake and drowsy  Description of the recording: The background rhythms of this recording consists of a fairly well modulated medium amplitude theta rhythm of 5-7 Hz that is reactive to eye opening and closure. As the record progresses, the patient appears to remain in the waking state throughout the recording. Photic stimulation was performed, did not show any abnormalities. Hyperventilation was not performed. Toward the end of the recording, the patient enters the drowsy state with slight symmetric slowing seen. The patient never enters stage II sleep. No abnormal epileptiform discharges seen during this recording. There was diffuse slowing and presence of frontal intermittent rhythmic delta activity but no focal slowing. EKG monitor shows no evidence of cardiac rhythm abnormalities with a heart rate of 90.  Impression: This is an abnormal EEG recording in the waking and drowsy state due to:  Frontal intermittent rhythmic delta  activity. Diffuse generalized slowing   Diffuse slowing and FIRDA are consistent with a generalized brain dysfunction such as encephalopathy. No evidence of interictal epileptiform discharges or seizures seen.   Windell Norfolk, MD [facility]

## 2021-04-10 ENCOUNTER — Telehealth: Payer: Self-pay | Admitting: Neurology

## 2021-04-10 NOTE — Telephone Encounter (Signed)
Spoke with daughter Sharee Holster regarding patient EEG.  Discussed the diffuse slowing which is nonspecific but likely from brain atrophy caused by chronic alcohol use.  Daughter reported she has not had any additional seizures since being on Keppra 500 mg twice daily.  I will see her in follow-up as scheduled.  Windell Norfolk, MD

## 2021-04-29 ENCOUNTER — Ambulatory Visit: Payer: Medicare Other

## 2021-04-29 ENCOUNTER — Inpatient Hospital Stay: Admission: RE | Admit: 2021-04-29 | Payer: Medicare Other | Source: Ambulatory Visit

## 2021-06-23 ENCOUNTER — Ambulatory Visit: Payer: Medicaid Other | Admitting: Neurology

## 2021-06-24 ENCOUNTER — Encounter: Payer: Self-pay | Admitting: Neurology

## 2021-07-16 ENCOUNTER — Other Ambulatory Visit: Payer: Self-pay

## 2021-07-16 ENCOUNTER — Emergency Department (HOSPITAL_COMMUNITY)
Admission: EM | Admit: 2021-07-16 | Discharge: 2021-07-16 | Disposition: A | Discharge status: Home / Self Care | Attending: Emergency Medicine | Admitting: Emergency Medicine

## 2021-07-16 ENCOUNTER — Emergency Department (HOSPITAL_COMMUNITY)

## 2021-07-16 ENCOUNTER — Encounter (HOSPITAL_COMMUNITY): Payer: Self-pay | Admitting: *Deleted

## 2021-07-16 DIAGNOSIS — S0083XA Contusion of other part of head, initial encounter: Secondary | ICD-10-CM | POA: Diagnosis not present

## 2021-07-16 DIAGNOSIS — Z79899 Other long term (current) drug therapy: Secondary | ICD-10-CM | POA: Diagnosis not present

## 2021-07-16 DIAGNOSIS — Z7951 Long term (current) use of inhaled steroids: Secondary | ICD-10-CM | POA: Insufficient documentation

## 2021-07-16 DIAGNOSIS — S0990XA Unspecified injury of head, initial encounter: Secondary | ICD-10-CM | POA: Diagnosis present

## 2021-07-16 DIAGNOSIS — F0390 Unspecified dementia without behavioral disturbance: Secondary | ICD-10-CM | POA: Diagnosis not present

## 2021-07-16 DIAGNOSIS — W19XXXA Unspecified fall, initial encounter: Secondary | ICD-10-CM

## 2021-07-16 DIAGNOSIS — I10 Essential (primary) hypertension: Secondary | ICD-10-CM | POA: Insufficient documentation

## 2021-07-16 DIAGNOSIS — J449 Chronic obstructive pulmonary disease, unspecified: Secondary | ICD-10-CM | POA: Diagnosis not present

## 2021-07-16 DIAGNOSIS — W06XXXA Fall from bed, initial encounter: Secondary | ICD-10-CM | POA: Insufficient documentation

## 2021-07-16 DIAGNOSIS — E119 Type 2 diabetes mellitus without complications: Secondary | ICD-10-CM | POA: Insufficient documentation

## 2021-07-16 LAB — CBC WITH DIFFERENTIAL/PLATELET
Abs Immature Granulocytes: 0.04 10*3/uL (ref 0.00–0.07)
Basophils Absolute: 0 10*3/uL (ref 0.0–0.1)
Basophils Relative: 1 %
Eosinophils Absolute: 0.2 10*3/uL (ref 0.0–0.5)
Eosinophils Relative: 5 %
HCT: 33 % — ABNORMAL LOW (ref 36.0–46.0)
Hemoglobin: 11.4 g/dL — ABNORMAL LOW (ref 12.0–15.0)
Immature Granulocytes: 1 %
Lymphocytes Relative: 46 %
Lymphs Abs: 1.6 10*3/uL (ref 0.7–4.0)
MCH: 31.8 pg (ref 26.0–34.0)
MCHC: 34.5 g/dL (ref 30.0–36.0)
MCV: 91.9 fL (ref 80.0–100.0)
Monocytes Absolute: 0.5 10*3/uL (ref 0.1–1.0)
Monocytes Relative: 13 %
Neutro Abs: 1.1 10*3/uL — ABNORMAL LOW (ref 1.7–7.7)
Neutrophils Relative %: 34 %
Platelets: 200 10*3/uL (ref 150–400)
RBC: 3.59 MIL/uL — ABNORMAL LOW (ref 3.87–5.11)
RDW: 14.8 % (ref 11.5–15.5)
WBC: 3.4 10*3/uL — ABNORMAL LOW (ref 4.0–10.5)
nRBC: 0 % (ref 0.0–0.2)

## 2021-07-16 LAB — COMPREHENSIVE METABOLIC PANEL
ALT: 22 U/L (ref 0–44)
AST: 46 U/L — ABNORMAL HIGH (ref 15–41)
Albumin: 3.6 g/dL (ref 3.5–5.0)
Alkaline Phosphatase: 105 U/L (ref 38–126)
Anion gap: 8 (ref 5–15)
BUN: <5 mg/dL — ABNORMAL LOW (ref 8–23)
CO2: 22 mmol/L (ref 22–32)
Calcium: 8.9 mg/dL (ref 8.9–10.3)
Chloride: 100 mmol/L (ref 98–111)
Creatinine, Ser: 0.34 mg/dL — ABNORMAL LOW (ref 0.44–1.00)
GFR, Estimated: >60 mL/min (ref >60)
Glucose, Bld: 88 mg/dL (ref 70–99)
Potassium: 3.8 mmol/L (ref 3.5–5.1)
Sodium: 130 mmol/L — ABNORMAL LOW (ref 135–145)
Total Bilirubin: 0.1 mg/dL — ABNORMAL LOW (ref 0.3–1.2)
Total Protein: 6.8 g/dL (ref 6.5–8.1)

## 2021-07-16 LAB — ETHANOL: Alcohol, Ethyl (B): 252 mg/dL — ABNORMAL HIGH (ref <10)

## 2021-07-16 LAB — PROTIME-INR
INR: 1.1 (ref 0.8–1.2)
Prothrombin Time: 14.4 seconds (ref 11.4–15.2)

## 2021-07-16 LAB — MAGNESIUM: Magnesium: 1.7 mg/dL (ref 1.7–2.4)

## 2021-07-16 MED ORDER — LACTATED RINGERS IV BOLUS
500.0000 mL | Freq: Once | INTRAVENOUS | Status: AC
  Administered 2021-07-16: 500 mL via INTRAVENOUS

## 2021-07-16 NOTE — ED Provider Notes (Signed)
[facility] EMERGENCY DEPARTMENT Provider Note   CSN: [id number] Arrival date & time: 07/16/21  1150     History  Chief Complaint  Patient presents with   Lytle Michaels    Gloria Santana is a 66 y.o. female.   Fall Patient presents for a fall.  Medical history is notable for COPD, DM, anemia, alcoholism, dementia, HTN. She arrives in the ED with her daughter, who she lives with, and who provides history. Fall occurred shortly prior to arrival. Patient had attempted to walk to the bathroom unassisted and fell forward, striking her forehead. She sustained a wound to her forehead which was bleeding but has since stopped. At baseline, patient requires assistance to ambulate. 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 She does continue to drink. Patient denies any physical complaints at this time. Per daughter, she has had a recent tetanus shot after a previous fall.     Home Medications Prior to Admission medications   Medication Sig Start Date End Date Taking? Authorizing Provider  acetaminophen (TYLENOL) 500 MG tablet Take 2 tablets (1,000 mg total) by mouth every 6 (six) hours. 09/16/19  Yes Domenic Polite, MD  albuterol (PROVENTIL) (2.5 MG/3ML) 0.083% nebulizer solution Take 2.5 mg by nebulization every 6 (six) hours as needed for wheezing or shortness of breath.   Yes [provider]  albuterol (VENTOLIN HFA) 108 (90 Base) MCG/ACT inhaler Inhale 1-2 puffs into the lungs every 6 (six) hours as needed for wheezing or shortness of breath.   Yes [provider]  KEPPRA 500 MG tablet Take 1 tablet (500 mg total) by mouth 2 (two) times daily. 03/20/21 07/16/21 Yes Camara, Maryan Puls, MD  LORazepam (ATIVAN) 1 MG tablet Take 0.5-1 mg by mouth daily as needed for anxiety. 03/15/21  Yes [provider]  SYMBICORT 160-4.5 MCG/ACT inhaler Inhale 1 puff into the lungs 2 (two) times daily. 06/14/21  Yes [provider]  Vitamin D, Ergocalciferol, (DRISDOL) 1.25  MG (50000 UNIT) CAPS capsule Take 50,000 Units by mouth every 7 (seven) days. Wednesday   Yes [provider]  potassium chloride SA (KLOR-CON) 20 MEQ tablet Take 2 tablets (40 mEq total) by mouth 2 (two) times daily for 7 days. 03/15/21 03/22/21  Mesner, Corene Cornea, MD      Allergies    Patient has no known allergies.    Review of Systems   Review of Systems  Unable to perform ROS: Dementia   Physical Exam Updated Vital Signs BP 102/78    Pulse 80    Temp (!) 97 F (36.1 C)    Resp 11    Ht 5\' 4"  (1.626 m)    Wt 52.6 kg    LMP  (LMP Unknown)    SpO2 96%    BMI 19.91 kg/m  Physical Exam Vitals and nursing note reviewed.  Constitutional:      General: She is not in acute distress.    Appearance: She is well-developed and normal weight. She is not toxic-appearing or diaphoretic.  HENT:     Head: Normocephalic.     Comments: Hematoma with overlying abrasion to central forehead. Hemostatic.     Right Ear: External ear normal.     Left Ear: External ear normal.     Nose: Nose normal.     Mouth/Throat:     Mouth: Mucous membranes are moist.     Pharynx: Oropharynx is clear.     Comments: No trismus, no malocclusion Eyes:  General: No scleral icterus.    Extraocular Movements: Extraocular movements intact.     Conjunctiva/sclera: Conjunctivae normal.  Cardiovascular:     Rate and Rhythm: Normal rate and regular rhythm.     Heart sounds: No murmur heard. Pulmonary:     Effort: Pulmonary effort is normal. No respiratory distress.     Breath sounds: Normal breath sounds. No wheezing or rales.  Chest:     Chest wall: No tenderness.  Abdominal:     General: Abdomen is flat.     Palpations: Abdomen is soft.     Tenderness: There is no abdominal tenderness.  Musculoskeletal:        General: No swelling, tenderness or deformity. Normal range of motion.     Cervical back: Normal range of motion and neck supple. No rigidity or tenderness.     Right lower leg: No edema.     Left  lower leg: No edema.  Skin:    General: Skin is warm and dry.     Capillary Refill: Capillary refill takes less than 2 seconds.     Coloration: Skin is not jaundiced or pale.  Neurological:     General: No focal deficit present.     Mental Status: She is alert. Mental status is at baseline.     Cranial Nerves: No cranial nerve deficit.     Sensory: No sensory deficit.     Motor: No weakness.     Coordination: Coordination normal.  Psychiatric:        Mood and Affect: Mood normal.        Behavior: Behavior normal.    ED Results / Procedures / Treatments   Labs (all labs ordered are listed, but only abnormal results are displayed) Labs Reviewed  COMPREHENSIVE METABOLIC PANEL - Abnormal; Notable for the following components:      Result Value   Sodium 130 (*)    BUN <5 (*)    Creatinine, Ser 0.34 (*)    AST 46 (*)    Total Bilirubin 0.1 (*)    All other components within normal limits  ETHANOL - Abnormal; Notable for the following components:   Alcohol, Ethyl (B) 252 (*)    All other components within normal limits  CBC WITH DIFFERENTIAL/PLATELET - Abnormal; Notable for the following components:   WBC 3.4 (*)    RBC 3.59 (*)    Hemoglobin 11.4 (*)    HCT 33.0 (*)    Neutro Abs 1.1 (*)    All other components within normal limits  PROTIME-INR  MAGNESIUM    EKG None  Radiology CT HEAD WO CONTRAST  Result Date: 07/16/2021 CLINICAL DATA:  Status post fall this morning. Striked her head on the floor. EXAM: CT HEAD WITHOUT CONTRAST CT CERVICAL SPINE WITHOUT CONTRAST TECHNIQUE: Multidetector CT imaging of the head and cervical spine was performed following the standard protocol without intravenous contrast. Multiplanar CT image reconstructions of the cervical spine were also generated. RADIATION DOSE REDUCTION: This exam was performed according to the departmental dose-optimization program which includes automated exposure control, adjustment of the mA and/or kV according to  patient size and/or use of iterative reconstruction technique. COMPARISON:  03/15/2021 FINDINGS: Brain: No evidence of acute infarction, hemorrhage, extra-axial collection, ventriculomegaly, or mass effect. Generalized cerebral atrophy. Periventricular white matter low attenuation likely secondary to microangiopathy. Vascular: No significant cerebrovascular atherosclerotic calcifications are noted. No hyperdense vessels. Skull: Negative for fracture or focal lesion. Sinuses/Orbits: Visualized portions of the orbits are unremarkable. Visualized portions  of the paranasal sinuses are unremarkable. Visualized portions of the mastoid air cells are unremarkable. Other: Right frontal scalp hematoma. CT CERVICAL SPINE FINDINGS Alignment: Normal. Skull base and vertebrae: No acute fracture. No primary bone lesion or focal pathologic process. Soft tissues and spinal canal: No prevertebral fluid or swelling. No visible canal hematoma. Disc levels: Degenerative disease with disc height loss at C4-5, C5-6 and C6-7. At C3-4 there is severe right and mild left facet arthropathy with bilateral foraminal stenosis. At C4-5 there is a broad-based disc osteophyte complex, bilateral uncovertebral degenerative changes, severe right facet arthropathy, mild left facet arthropathy, and right foraminal stenosis. At C5-6 there is a broad-based disc osteophyte complex flattening the ventral thecal sac, bilateral uncovertebral degenerative changes, and moderate-severe bilateral foraminal stenosis. At C6-7 there is a broad-based disc osteophyte complex, bilateral uncovertebral degenerative changes, severe left foraminal stenosis, and moderate-severe right foraminal stenosis. Upper chest: Lung apices are clear. Other: No fluid collection or hematoma. IMPRESSION: 1. No acute intracranial pathology. Right frontal scalp hematoma. 2. No acute fracture or subluxation of the cervical spine. 3. Cervical spine spondylosis as described above.  Electronically Signed   By: Elige KoHetal  Patel M.D.   On: 07/16/2021 14:10   CT CERVICAL SPINE WO CONTRAST  Result Date: 07/16/2021 CLINICAL DATA:  Status post fall this morning. Striked her head on the floor. EXAM: CT HEAD WITHOUT CONTRAST CT CERVICAL SPINE WITHOUT CONTRAST TECHNIQUE: Multidetector CT imaging of the head and cervical spine was performed following the standard protocol without intravenous contrast. Multiplanar CT image reconstructions of the cervical spine were also generated. RADIATION DOSE REDUCTION: This exam was performed according to the departmental dose-optimization program which includes automated exposure control, adjustment of the mA and/or kV according to patient size and/or use of iterative reconstruction technique. COMPARISON:  03/15/2021 FINDINGS: Brain: No evidence of acute infarction, hemorrhage, extra-axial collection, ventriculomegaly, or mass effect. Generalized cerebral atrophy. Periventricular white matter low attenuation likely secondary to microangiopathy. Vascular: No significant cerebrovascular atherosclerotic calcifications are noted. No hyperdense vessels. Skull: Negative for fracture or focal lesion. Sinuses/Orbits: Visualized portions of the orbits are unremarkable. Visualized portions of the paranasal sinuses are unremarkable. Visualized portions of the mastoid air cells are unremarkable. Other: Right frontal scalp hematoma. CT CERVICAL SPINE FINDINGS Alignment: Normal. Skull base and vertebrae: No acute fracture. No primary bone lesion or focal pathologic process. Soft tissues and spinal canal: No prevertebral fluid or swelling. No visible canal hematoma. Disc levels: Degenerative disease with disc height loss at C4-5, C5-6 and C6-7. At C3-4 there is severe right and mild left facet arthropathy with bilateral foraminal stenosis. At C4-5 there is a broad-based disc osteophyte complex, bilateral uncovertebral degenerative changes, severe right facet arthropathy, mild left  facet arthropathy, and right foraminal stenosis. At C5-6 there is a broad-based disc osteophyte complex flattening the ventral thecal sac, bilateral uncovertebral degenerative changes, and moderate-severe bilateral foraminal stenosis. At C6-7 there is a broad-based disc osteophyte complex, bilateral uncovertebral degenerative changes, severe left foraminal stenosis, and moderate-severe right foraminal stenosis. Upper chest: Lung apices are clear. Other: No fluid collection or hematoma. IMPRESSION: 1. No acute intracranial pathology. Right frontal scalp hematoma. 2. No acute fracture or subluxation of the cervical spine. 3. Cervical spine spondylosis as described above. Electronically Signed   By: Elige KoHetal  Patel M.D.   On: 07/16/2021 14:10    Procedures Procedures    Medications Ordered in ED Medications  lactated ringers bolus 500 mL (0 mLs Intravenous Stopped 07/16/21 1452)    ED Course/  Medical Decision Making/ A&P                           Medical Decision Making Amount and/or Complexity of Data Reviewed Labs: ordered. Radiology: ordered.   66 year old female with alcohol-related dementia, COPD, HTN, DM, and ongoing alcoholism, currently under hospice care at home, presents after a fall. Ddx includes acute injuries, specifically ICH and/or C-spine injury from neck hyperextension. Fall is assumed to be mechanical in nature after she attempted to walk to bathroom unassisted. Patient's daughter states that the patient cannot ambulate unassisted at baseline. She sustained a hematoma and abrasion to her forehead, which is observed on arrival in the ED. Physical exam is otherwise negative for evidence of injury. Imaging studies were ordered. Given her ongoing alcoholism and poor PO intake, labwork was obtained to assess for reversible metabolic abnormalities. IV fluids were given. Imaging studies were negative for acute findings. Labwork showed elevated EtOH level but was otherwise unremarkable. Patient  continues to deny any physical complaints. She requests to go home. Given superficial nature of forehead wound, no closure is indicated. Patient is up to date on tetanus, per daughter. Wound was dressed with bandage. Patient was discharged in the care of her daughter.   Social determinates of health: dementia, alcoholism        Final Clinical Impression(s) / ED Diagnoses Final diagnoses:  Fall, initial encounter    Rx / DC Orders ED Discharge Orders     None         Godfrey Pick, MD 07/17/21 1850

## 2021-07-16 NOTE — ED Triage Notes (Signed)
Pt c/o fall this morning while getting out of bed, striking her head on the floor. Family reports she has been "going in and out" since the fall.
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
## 2022-03-24 ENCOUNTER — Observation Stay (HOSPITAL_COMMUNITY)
Admission: EM | Admit: 2022-03-24 | Discharge: 2022-03-25 | Disposition: A | Discharge status: Hospice | Attending: Internal Medicine | Admitting: Internal Medicine

## 2022-03-24 ENCOUNTER — Encounter (HOSPITAL_COMMUNITY): Payer: Self-pay | Admitting: *Deleted

## 2022-03-24 ENCOUNTER — Other Ambulatory Visit: Payer: Self-pay

## 2022-03-24 ENCOUNTER — Emergency Department (HOSPITAL_COMMUNITY)

## 2022-03-24 DIAGNOSIS — G9340 Encephalopathy, unspecified: Secondary | ICD-10-CM

## 2022-03-24 DIAGNOSIS — I10 Essential (primary) hypertension: Secondary | ICD-10-CM | POA: Diagnosis not present

## 2022-03-24 DIAGNOSIS — G40909 Epilepsy, unspecified, not intractable, without status epilepticus: Secondary | ICD-10-CM | POA: Diagnosis not present

## 2022-03-24 DIAGNOSIS — F17210 Nicotine dependence, cigarettes, uncomplicated: Secondary | ICD-10-CM | POA: Insufficient documentation

## 2022-03-24 DIAGNOSIS — R1310 Dysphagia, unspecified: Secondary | ICD-10-CM | POA: Diagnosis not present

## 2022-03-24 DIAGNOSIS — Z79899 Other long term (current) drug therapy: Secondary | ICD-10-CM | POA: Diagnosis not present

## 2022-03-24 DIAGNOSIS — E43 Unspecified severe protein-calorie malnutrition: Secondary | ICD-10-CM | POA: Insufficient documentation

## 2022-03-24 DIAGNOSIS — J449 Chronic obstructive pulmonary disease, unspecified: Secondary | ICD-10-CM | POA: Insufficient documentation

## 2022-03-24 DIAGNOSIS — R569 Unspecified convulsions: Secondary | ICD-10-CM

## 2022-03-24 DIAGNOSIS — E11649 Type 2 diabetes mellitus with hypoglycemia without coma: Secondary | ICD-10-CM | POA: Insufficient documentation

## 2022-03-24 DIAGNOSIS — F0390 Unspecified dementia without behavioral disturbance: Secondary | ICD-10-CM | POA: Diagnosis not present

## 2022-03-24 DIAGNOSIS — K117 Disturbances of salivary secretion: Secondary | ICD-10-CM | POA: Diagnosis present

## 2022-03-24 LAB — ETHANOL: Alcohol, Ethyl (B): 179 mg/dL — ABNORMAL HIGH (ref <10)

## 2022-03-24 LAB — COMPREHENSIVE METABOLIC PANEL
ALT: 23 U/L (ref 0–44)
AST: 63 U/L — ABNORMAL HIGH (ref 15–41)
Albumin: 2.4 g/dL — ABNORMAL LOW (ref 3.5–5.0)
Alkaline Phosphatase: 160 U/L — ABNORMAL HIGH (ref 38–126)
Anion gap: 11 (ref 5–15)
BUN: <5 mg/dL — ABNORMAL LOW (ref 8–23)
CO2: 24 mmol/L (ref 22–32)
Calcium: 8.4 mg/dL — ABNORMAL LOW (ref 8.9–10.3)
Chloride: 100 mmol/L (ref 98–111)
Creatinine, Ser: 0.32 mg/dL — ABNORMAL LOW (ref 0.44–1.00)
GFR, Estimated: >60 mL/min (ref >60)
Glucose, Bld: 83 mg/dL (ref 70–99)
Potassium: 3.5 mmol/L (ref 3.5–5.1)
Sodium: 135 mmol/L (ref 135–145)
Total Bilirubin: 1 mg/dL (ref 0.3–1.2)
Total Protein: 6.2 g/dL — ABNORMAL LOW (ref 6.5–8.1)

## 2022-03-24 LAB — CBC
HCT: 32.5 % — ABNORMAL LOW (ref 36.0–46.0)
Hemoglobin: 11.6 g/dL — ABNORMAL LOW (ref 12.0–15.0)
MCH: 30.8 pg (ref 26.0–34.0)
MCHC: 35.7 g/dL (ref 30.0–36.0)
MCV: 86.2 fL (ref 80.0–100.0)
Platelets: 175 10*3/uL (ref 150–400)
RBC: 3.77 MIL/uL — ABNORMAL LOW (ref 3.87–5.11)
RDW: 13.6 % (ref 11.5–15.5)
WBC: 3.7 10*3/uL — ABNORMAL LOW (ref 4.0–10.5)
nRBC: 0 % (ref 0.0–0.2)

## 2022-03-24 LAB — AMMONIA: Ammonia: 19 umol/L (ref 9–35)

## 2022-03-24 LAB — DIFFERENTIAL
Abs Immature Granulocytes: 0.01 10*3/uL (ref 0.00–0.07)
Basophils Absolute: 0 10*3/uL (ref 0.0–0.1)
Basophils Relative: 1 %
Eosinophils Absolute: 0.1 10*3/uL (ref 0.0–0.5)
Eosinophils Relative: 3 %
Immature Granulocytes: 0 %
Lymphocytes Relative: 39 %
Lymphs Abs: 1.5 10*3/uL (ref 0.7–4.0)
Monocytes Absolute: 0.6 10*3/uL (ref 0.1–1.0)
Monocytes Relative: 17 %
Neutro Abs: 1.5 10*3/uL — ABNORMAL LOW (ref 1.7–7.7)
Neutrophils Relative %: 40 %

## 2022-03-24 LAB — PROTIME-INR
INR: 1.7 — ABNORMAL HIGH (ref 0.8–1.2)
Prothrombin Time: 20 seconds — ABNORMAL HIGH (ref 11.4–15.2)

## 2022-03-24 LAB — APTT: aPTT: 50 seconds — ABNORMAL HIGH (ref 24–36)

## 2022-03-24 LAB — LIPASE, BLOOD: Lipase: 21 U/L (ref 11–51)

## 2022-03-24 MED ORDER — SODIUM CHLORIDE 0.9 % IV SOLN
750.0000 mg | Freq: Two times a day (BID) | INTRAVENOUS | Status: DC
  Filled 2022-03-24: qty 7.5

## 2022-03-24 MED ORDER — LACTATED RINGERS IV SOLN: INTRAVENOUS | Status: DC

## 2022-03-24 MED ORDER — LORAZEPAM 2 MG/ML IJ SOLN: 2.0000 mg | INTRAMUSCULAR | Status: DC | PRN

## 2022-03-24 MED ORDER — LEVETIRACETAM IN NACL 500 MG/100ML IV SOLN: 500.0000 mg | Freq: Two times a day (BID) | INTRAVENOUS | Status: DC

## 2022-03-24 MED ORDER — THIAMINE HCL 100 MG/ML IJ SOLN: 100.0000 mg | Freq: Every day | INTRAMUSCULAR | Status: DC

## 2022-03-24 MED ORDER — ADULT MULTIVITAMIN W/MINERALS CH
1.0000 | ORAL_TABLET | Freq: Every day | ORAL | Status: DC
  Administered 2022-03-25: 1 via ORAL
  Filled 2022-03-24: qty 1

## 2022-03-24 MED ORDER — DEXTROSE-NACL 5-0.45 % IV SOLN: INTRAVENOUS | Status: DC

## 2022-03-24 MED ORDER — CLOPIDOGREL BISULFATE 75 MG PO TABS
300.0000 mg | ORAL_TABLET | Freq: Once | ORAL | Status: AC
  Administered 2022-03-24: 300 mg via ORAL
  Filled 2022-03-24: qty 4

## 2022-03-24 MED ORDER — LORAZEPAM 2 MG/ML IJ SOLN
1.0000 mg | INTRAMUSCULAR | Status: DC | PRN
  Administered 2022-03-25: 2 mg via INTRAVENOUS
  Filled 2022-03-24: qty 1

## 2022-03-24 MED ORDER — ENOXAPARIN SODIUM 30 MG/0.3ML IJ SOSY
30.0000 mg | PREFILLED_SYRINGE | INTRAMUSCULAR | Status: DC
  Filled 2022-03-24: qty 0.3

## 2022-03-24 MED ORDER — MOMETASONE FURO-FORMOTEROL FUM 200-5 MCG/ACT IN AERO
2.0000 | INHALATION_SPRAY | Freq: Two times a day (BID) | RESPIRATORY_TRACT | Status: DC
  Administered 2022-03-25: 2 via RESPIRATORY_TRACT
  Filled 2022-03-24 (×2): qty 8.8

## 2022-03-24 MED ORDER — THIAMINE MONONITRATE 100 MG PO TABS
100.0000 mg | ORAL_TABLET | Freq: Every day | ORAL | Status: DC
  Administered 2022-03-25: 100 mg via ORAL
  Filled 2022-03-24: qty 1

## 2022-03-24 MED ORDER — ASPIRIN 81 MG PO TBEC
81.0000 mg | DELAYED_RELEASE_TABLET | Freq: Once | ORAL | Status: AC
  Administered 2022-03-24: 81 mg via ORAL
  Filled 2022-03-24: qty 1

## 2022-03-24 MED ORDER — FOLIC ACID 1 MG PO TABS
1.0000 mg | ORAL_TABLET | Freq: Every day | ORAL | Status: DC
  Administered 2022-03-25: 1 mg via ORAL
  Filled 2022-03-24: qty 1

## 2022-03-24 MED ORDER — ALBUTEROL SULFATE HFA 108 (90 BASE) MCG/ACT IN AERS: 1.0000 | INHALATION_SPRAY | Freq: Four times a day (QID) | RESPIRATORY_TRACT | Status: DC | PRN

## 2022-03-24 MED ORDER — ALBUTEROL SULFATE (2.5 MG/3ML) 0.083% IN NEBU: 2.5000 mg | INHALATION_SOLUTION | Freq: Four times a day (QID) | RESPIRATORY_TRACT | Status: DC | PRN

## 2022-03-24 MED ORDER — LORAZEPAM 1 MG PO TABS: 1.0000 mg | ORAL_TABLET | ORAL | Status: DC | PRN

## 2022-03-24 NOTE — ED Notes (Signed)
Pt's daughter ran out of room calling for an Therapist, sports. C/o pt having a seizure. Upon arrival, pt was at baseline. Could tell me their name and location. Daughter states that pt would not answer her and was staring off into place. Lasted a min or less. Provider made aware

## 2022-03-24 NOTE — ED Provider Notes (Signed)
[facility] EMERGENCY DEPARTMENT Provider Note   CSN: [id number] Arrival date & time: 03/24/22  1645     History  Chief Complaint  Patient presents with   Drooling    Gloria Santana is a 66 y.o. female.  Patient who is currently on hospice care with history of COPD, DM, anemia, alcoholism, dementia, HTN, seizure disorder on Keppra--presents to the emergency department today for evaluation.  Family states that the patient had a seizure last Friday (today is Tuesday) and then again early yesterday morning.  Since the seizure on Friday she has had a change in her mental status and behavior.  They state that she is leaning forward more and has been drooling more.  Transported to the hospital by EMS today.  Daughters at bedside provide most of the history.  They spoke with the primary care who recommended that she come in to be evaluated for stroke or other problem.  Family thought that they had noticed some facial droop at times.  Daughters at bedside do not feel that she is intoxicated because she cannot drink well.  They state that her parents behavior has been different since the seizure about 4 days ago.  No urinary symptoms or history of UTI.  She has not been having diarrhea.  No chest pain or shortness of breath reported.      Home Medications Prior to Admission medications   Medication Sig Start Date End Date Taking? Authorizing Provider  acetaminophen (TYLENOL) 500 MG tablet Take 2 tablets (1,000 mg total) by mouth every 6 (six) hours. 09/16/19   Zannie Cove, MD  albuterol (PROVENTIL) (2.5 MG/3ML) 0.083% nebulizer solution Take 2.5 mg by nebulization every 6 (six) hours as needed for wheezing or shortness of breath.    [provider]  albuterol (VENTOLIN HFA) 108 (90 Base) MCG/ACT inhaler Inhale 1-2 puffs into the lungs every 6 (six) hours as needed for wheezing or shortness of breath.    [provider]  KEPPRA 500 MG tablet Take 1 tablet (500 mg total) by  mouth 2 (two) times daily. 03/20/21 07/16/21  Windell Norfolk, MD  LORazepam (ATIVAN) 1 MG tablet Take 0.5-1 mg by mouth daily as needed for anxiety. 03/15/21   [provider]  potassium chloride SA (KLOR-CON) 20 MEQ tablet Take 2 tablets (40 mEq total) by mouth 2 (two) times daily for 7 days. 03/15/21 03/22/21  Mesner, Barbara Cower, MD  SYMBICORT 160-4.5 MCG/ACT inhaler Inhale 1 puff into the lungs 2 (two) times daily. 06/14/21   [provider]  Vitamin D, Ergocalciferol, (DRISDOL) 1.25 MG (50000 UNIT) CAPS capsule Take 50,000 Units by mouth every 7 (seven) days. Wednesday    [provider]      Allergies    Patient has no known allergies.    Review of Systems   Review of Systems  Physical Exam Updated Vital Signs BP 110/83 (BP Location: Left Arm)   Pulse 87   Temp 97.9 F (36.6 C) (Oral)   Resp 18   Ht 5\' 7"  (1.702 m)   Wt 40.8 kg   LMP  (LMP Unknown)   SpO2 94%   BMI 14.10 kg/m   Physical Exam Vitals and nursing note reviewed.  Constitutional:      General: She is not in acute distress.    Appearance: She is well-developed.  HENT:     Head: Normocephalic and atraumatic.     Right Ear: External ear normal.     Left Ear: External ear normal.  Nose: Nose normal.     Mouth/Throat:     Comments: Leaning forward, drooling. Eyes:     Conjunctiva/sclera: Conjunctivae normal.  Cardiovascular:     Rate and Rhythm: Normal rate and regular rhythm.     Heart sounds: No murmur heard. Pulmonary:     Effort: No respiratory distress.     Breath sounds: No wheezing, rhonchi or rales.  Abdominal:     Palpations: Abdomen is soft.     Tenderness: There is no abdominal tenderness. There is no guarding or rebound.  Musculoskeletal:     Cervical back: Normal range of motion and neck supple.     Right lower leg: No edema.     Left lower leg: No edema.  Skin:    General: Skin is warm and dry.     Findings: No rash.  Neurological:     General: No focal deficit  present.     Mental Status: She is alert.     Cranial Nerves: Cranial nerves 2-12 are intact. No cranial nerve deficit or facial asymmetry.     Motor: No weakness.     Comments: Patient stooped forward, drooling.  She does follow commands.  Cranial nerves seem to be intact.  No obvious facial droop when she smiles.  She squeezes my fingers bilaterally.  She wiggles her toes.  Psychiatric:        Mood and Affect: Mood normal.    ED Results / Procedures / Treatments   Labs (all labs ordered are listed, but only abnormal results are displayed) Labs Reviewed  PROTIME-INR - Abnormal; Notable for the following components:      Result Value   Prothrombin Time 20.0 (*)    INR 1.7 (*)    All other components within normal limits  APTT - Abnormal; Notable for the following components:   aPTT 50 (*)    All other components within normal limits  CBC - Abnormal; Notable for the following components:   WBC 3.7 (*)    RBC 3.77 (*)    Hemoglobin 11.6 (*)    HCT 32.5 (*)    All other components within normal limits  DIFFERENTIAL - Abnormal; Notable for the following components:   Neutro Abs 1.5 (*)    All other components within normal limits  COMPREHENSIVE METABOLIC PANEL - Abnormal; Notable for the following components:   BUN <5 (*)    Creatinine, Ser 0.32 (*)    Calcium 8.4 (*)    Total Protein 6.2 (*)    Albumin 2.4 (*)    AST 63 (*)    Alkaline Phosphatase 160 (*)    All other components within normal limits  ETHANOL - Abnormal; Notable for the following components:   Alcohol, Ethyl (B) 179 (*)    All other components within normal limits  LIPASE, BLOOD  AMMONIA  URINALYSIS, ROUTINE W REFLEX MICROSCOPIC    EKG None  Radiology CT Head Wo Contrast  Result Date: 03/24/2022 CLINICAL DATA:  Mental status change, unknown cause. Family reported pt started drooling today but pt's granddaughter told ems pt drools when she drinks alcohol; bottle of beer found beside pt when ems  arrived Pt admits to drinking a 40 oz of beer Pt is actively drooling but is alert EXAM: CT HEAD WITHOUT CONTRAST TECHNIQUE: Contiguous axial images were obtained from the base of the skull through the vertex without intravenous contrast. RADIATION DOSE REDUCTION: This exam was performed according to the departmental dose-optimization program which includes automated exposure  control, adjustment of the mA and/or kV according to patient size and/or use of iterative reconstruction technique. COMPARISON:  CT head 07/16/2021 BRAIN: BRAIN Cerebral ventricle sizes are concordant with the degree of cerebral volume loss. No evidence of large-territorial acute infarction. No parenchymal hemorrhage. No mass lesion. No extra-axial collection. No mass effect or midline shift. No hydrocephalus. Basilar cisterns are patent. Vascular: No hyperdense vessel. Skull: No acute fracture or focal lesion. Sinuses/Orbits: Paranasal sinuses and mastoid air cells are clear. The orbits are unremarkable. Other: None. IMPRESSION: No acute intracranial abnormality. Electronically Signed   By: Tish Frederickson M.D.   On: 03/24/2022 17:49    Procedures Procedures    Medications Ordered in ED Medications - No data to display  ED Course/ Medical Decision Making/ A&P    Patient seen and examined. History obtained directly from patient's daughters as well as previous ED notes.  Labs/EKG: Ordered stroke including ammonia, UA  Imaging: Ordered CT head.  Medications/Fluids: None ordered  Most recent vital signs reviewed and are as follows: BP 110/83 (BP Location: Left Arm)   Pulse 87   Temp 97.9 F (36.6 C) (Oral)   Resp 18   Ht 5\' 7"  (1.702 m)   Wt 40.8 kg   LMP  (LMP Unknown)   SpO2 94%   BMI 14.10 kg/m   Initial impression: Encephalopathy, seizure disorder  7:07 PM   Labs personally reviewed and interpreted including: CBC with white blood cell count 3.7, hemoglobin mildly decreased at 11.6 otherwise unremarkable;  CMP with creatinine 0.32, BUN less than 5 likely related to low BMI and muscle wasting; ethanol elevated at 179; lipase 21; INR elevated at 1.7; APTT elevated at 50.  Imaging personally visualized and interpreted including: CT head, agree no evidence of bleeding.  Reviewed pertinent lab work and imaging with patient at bedside. Questions answered.   Most current vital signs reviewed and are as follows: BP 110/83 (BP Location: Left Arm)   Pulse 87   Temp 97.9 F (36.6 C) (Oral)   Resp 18   Ht 5\' 7"  (1.702 m)   Wt 40.8 kg   LMP  (LMP Unknown)   SpO2 94%   BMI 14.10 kg/m   Plan: Pending completion of work-up.  Patient discussed with and seen by Dr. , who will assume care.                          Medical Decision Making Amount and/or Complexity of Data Reviewed Labs: ordered. Radiology: ordered.           Final Clinical Impression(s) / ED Diagnoses Final diagnoses:  Encephalopathy    Rx / DC Orders ED Discharge Orders     None         , PA-C 03/24/22 1910    Renne Crigler, MD 03/24/22 2349

## 2022-03-24 NOTE — H&P (Signed)
TRH H&P    Patient Demographics:    Gloria Santana, is a 66 y.o. female  MRN: [id number]  DOB - [birth year]  Admit Date - 03/24/2022  Referring MD/NP/PA: Dr. Rhunette Croft  Outpatient Primary MD for the patient is Loura Back, NP  Patient coming from: Home  Chief complaint-seizure, facial droop   HPI:    Gloria Santana  is a 66 y.o. female, with history of COPD, anemia, alcoholism, dementia, hypertension, seizure disorder on Keppra was brought to the ED for evaluation for drooling from the mouth.  Patient's family states that she had a seizure last Friday and then again once again this early morning.  Since the seizure on Friday close change in her mental status and behavior.  She has been drooling more.  Patient does drink 1 beer every day.  She is currently enrolled in hospice.  Patient is DNR. There is no history of nausea vomiting. Denies abdominal pain. Denies dysuria In the ED CT of the head was unremarkable. Due to constant drooling, tele neurology was consulted; recommended MRI brain to rule out stroke.     Review of systems:    In addition to the HPI above,    All other systems reviewed and are negative.    Past History of the following :    Past Medical History:  Diagnosis Date   Alcohol dependence (HCC)    Alcoholic dementia (HCC)    Anemia    COPD (chronic obstructive pulmonary disease) (HCC)    Diabetes mellitus without complication (HCC)    Pt states she is not diabetic   Fatty liver    Hypertension    Pancreatitis    Poor circulation       Past Surgical History:  Procedure Laterality Date   ABDOMINAL HYSTERECTOMY     VIDEO ASSISTED THORACOSCOPY (VATS)/EMPYEMA Left 09/13/2019   Procedure: VIDEO ASSISTED THORACOSCOPY (VATS) EVACUATION of HEMOTHORAX WITH INTERCOSTAL NERVE BLOCK;  Surgeon: Delight Ovens, MD;  Location: [facility] OR;  Service: Thoracic;  Laterality: Left;   VIDEO  BRONCHOSCOPY N/A 09/13/2019   Procedure: Video Bronchoscopy;  Surgeon: Delight Ovens, MD;  Location: [facility] OR;  Service: Thoracic;  Laterality: N/A;      Social History:      Social History   Tobacco Use   Smoking status: Every Day    Packs/day: 2.50    Years: 35.00    Total pack years: 87.50    Types: Cigarettes   Smokeless tobacco: Never  Substance Use Topics   Alcohol use: Yes    Comment: 24oz beer daily       Family History :     Family History  Problem Relation Age of Onset   Diabetes Mother    Lung cancer Father    Colon cancer Neg Hx    Colon polyps Neg Hx       Home Medications:   Prior to Admission medications   Medication Sig Start Date End Date Taking? Authorizing Provider  acetaminophen (TYLENOL) 500 MG tablet Take 2 tablets (1,000 mg total) by  mouth every 6 (six) hours. 09/16/19  Yes Zannie CoveJoseph, Preetha, MD  albuterol (VENTOLIN HFA) 108 (90 Base) MCG/ACT inhaler Inhale 1-2 puffs into the lungs every 6 (six) hours as needed for wheezing or shortness of breath.   Yes [provider]  haloperidol (HALDOL) 5 MG tablet Take 5 mg by mouth every 4 (four) hours as needed. 02/16/22  Yes [provider]  hydrOXYzine (ATARAX) 10 MG tablet Take 10 mg by mouth every 4 (four) hours as needed. 02/13/22  Yes [provider]  levETIRAcetam (KEPPRA) 500 MG tablet Take 500 mg by mouth 2 (two) times daily.   Yes [provider]  pregabalin (LYRICA) 25 MG capsule Take 25 mg by mouth 2 (two) times daily.   Yes [provider]  SYMBICORT 160-4.5 MCG/ACT inhaler Inhale 1 puff into the lungs 2 (two) times daily as needed (shortness of breath). 06/14/21  Yes [provider]  albuterol (PROVENTIL) (2.5 MG/3ML) 0.083% nebulizer solution Take 2.5 mg by nebulization every 6 (six) hours as needed for wheezing or shortness of breath. Patient not taking: Reported on 03/24/2022    [provider]     Allergies:    No Known  Allergies   Physical Exam:   Vitals  Blood pressure (!) 123/90, pulse (!) 102, temperature 97.9 F (36.6 C), temperature source Oral, resp. rate 13, height 5\' 7"  (1.702 m), weight 40.8 kg, SpO2 97 %.  1.  General: Appears in no acute distress  2. Psychiatric: Alert, oriented x3  3. Neurologic: Cranial nerves II through grossly 12 intact, does have drooling from mouth, facial droop, moving all extremities  4. HEENMT:  Atraumatic normocephalic  5. Respiratory : Clear to auscultation bilaterally  6. Cardiovascular : S1-S2, regular, no murmur auscultated  7. Gastrointestinal:  Abdomen is soft, nontender, no organomegaly      Data Review:    CBC Recent Labs  Lab 03/24/22 1805  WBC 3.7*  HGB 11.6*  HCT 32.5*  PLT 175  MCV 86.2  MCH 30.8  MCHC 35.7  RDW 13.6  LYMPHSABS 1.5  MONOABS 0.6  EOSABS 0.1  BASOSABS 0.0   ------------------------------------------------------------------------------------------------------------------  Results for orders placed or performed during the hospital encounter of 03/24/22 (from the past 48 hour(s))  Protime-INR     Status: Abnormal   Collection Time: 03/24/22  6:05 PM  Result Value Ref Range   Prothrombin Time 20.0 (H) 11.4 - 15.2 seconds   INR 1.7 (H) 0.8 - 1.2    Comment: (NOTE) INR goal varies based on device and disease states. Performed at [facility], [address] [place], [place] [address]   APTT     Status: Abnormal   Collection Time: 03/24/22  6:05 PM  Result Value Ref Range   aPTT 50 (H) 24 - 36 seconds    Comment:        IF BASELINE aPTT IS ELEVATED, SUGGEST PATIENT RISK ASSESSMENT BE USED TO DETERMINE APPROPRIATE ANTICOAGULANT THERAPY. Performed at [facility], [address] [place], [place] [address]   CBC     Status: Abnormal   Collection Time: 03/24/22  6:05 PM  Result Value Ref Range   WBC 3.7 (L) 4.0 - 10.5 K/uL   RBC 3.77 (L) 3.87 - 5.11 MIL/uL   Hemoglobin 11.6 (L) 12.0 - 15.0 g/dL    HCT 47.432.5 (L) 25.936.0 - 46.0 %   MCV 86.2 80.0 - 100.0 fL   MCH 30.8 26.0 - 34.0 pg   MCHC 35.7 30.0 -  36.0 g/dL   RDW 38.7 56.4 - 33.2 %   Platelets 175 150 - 400 K/uL   nRBC 0.0 0.0 - 0.2 %    Comment: Performed at [facility], [address] [place], [place] [address]  Differential     Status: Abnormal   Collection Time: 03/24/22  6:05 PM  Result Value Ref Range   Neutrophils Relative % 40 %   Neutro Abs 1.5 (L) 1.7 - 7.7 K/uL   Lymphocytes Relative 39 %   Lymphs Abs 1.5 0.7 - 4.0 K/uL   Monocytes Relative 17 %   Monocytes Absolute 0.6 0.1 - 1.0 K/uL   Eosinophils Relative 3 %   Eosinophils Absolute 0.1 0.0 - 0.5 K/uL   Basophils Relative 1 %   Basophils Absolute 0.0 0.0 - 0.1 K/uL   Immature Granulocytes 0 %   Abs Immature Granulocytes 0.01 0.00 - 0.07 K/uL    Comment: Performed at [facility], [address], [place], [place] [address]  Comprehensive metabolic panel     Status: Abnormal   Collection Time: 03/24/22  6:05 PM  Result Value Ref Range   Sodium 135 135 - 145 mmol/L   Potassium 3.5 3.5 - 5.1 mmol/L   Chloride 100 98 - 111 mmol/L   CO2 24 22 - 32 mmol/L   Glucose, Bld 83 70 - 99 mg/dL    Comment: Glucose reference range applies only to samples taken after fasting for at least 8 hours.   BUN <5 (L) 8 - 23 mg/dL   Creatinine, Ser 6.30 (L) 0.44 - 1.00 mg/dL   Calcium 8.4 (L) 8.9 - 10.3 mg/dL   Total Protein 6.2 (L) 6.5 - 8.1 g/dL   Albumin 2.4 (L) 3.5 - 5.0 g/dL   AST 63 (H) 15 - 41 U/L   ALT 23 0 - 44 U/L   Alkaline Phosphatase 160 (H) 38 - 126 U/L   Total Bilirubin 1.0 0.3 - 1.2 mg/dL   GFR, Estimated >16 >01 mL/min    Comment: (NOTE) Calculated using the CKD-EPI Creatinine Equation (2021)    Anion gap 11 5 - 15    Comment: Performed at [facility], [address] [place], [place] [address]  Ethanol     Status: Abnormal   Collection Time: 03/24/22  6:05 PM  Result Value Ref Range   Alcohol, Ethyl (B) 179 (H) <10 mg/dL    Comment: (NOTE) Lowest  detectable limit for serum alcohol is 10 mg/dL.  For medical purposes only. Performed at [facility], [address] [place], [place] [address]   Ammonia     Status: None   Collection Time: 03/24/22  6:05 PM  Result Value Ref Range   Ammonia 19 9 - 35 umol/L    Comment: Performed at [facility], [address], [place], [place] [address]  Lipase, blood     Status: None   Collection Time: 03/24/22  6:05 PM  Result Value Ref Range   Lipase 21 11 - 51 U/L    Comment: Performed at [facility], [address], [place], [place] [address]    Chemistries  Recent Labs  Lab 03/24/22 1805  NA 135  K 3.5  CL 100  CO2 24  GLUCOSE 83  BUN <5*  CREATININE 0.32*  CALCIUM 8.4*  AST 63*  ALT 23  ALKPHOS 160*  BILITOT 1.0   ------------------------------------------------------------------------------------------------------------------  ------------------------------------------------------------------------------------------------------------------ GFR: Estimated Creatinine Clearance: 44 mL/min (A) (by C-G formula based on SCr of 0.32 mg/dL (L)). Liver  Function Tests: Recent Labs  Lab 03/24/22 1805  AST 63*  ALT 23  ALKPHOS 160*  BILITOT 1.0  PROT 6.2*  ALBUMIN 2.4*   Recent Labs  Lab 03/24/22 1805  LIPASE 21   Recent Labs  Lab 03/24/22 1805  AMMONIA 19   Coagulation Profile: Recent Labs  Lab 03/24/22 1805  INR 1.7*   Cardiac Enzymes: No results for input(s): "CKTOTAL", "CKMB", "CKMBINDEX", "TROPONINI" in the last 168 hours. BNP (last 3 results) No results for input(s): "PROBNP" in the last 8760 hours. HbA1C: No results for input(s): "HGBA1C" in the last 72 hours. CBG: No results for input(s): "GLUCAP" in the last 168 hours. Lipid Profile: No results for input(s): "CHOL", "HDL", "LDLCALC", "TRIG", "CHOLHDL", "LDLDIRECT" in the last 72 hours. Thyroid Function Tests: No results for input(s): "TSH", "T4TOTAL", "FREET4", "T3FREE", "THYROIDAB" in the last 72  hours. Anemia Panel: No results for input(s): "VITAMINB12", "FOLATE", "FERRITIN", "TIBC", "IRON", "RETICCTPCT" in the last 72 hours.  --------------------------------------------------------------------------------------------------------------- Urine analysis:    Component Value Date/Time   COLORURINE YELLOW 03/15/2021 0620   APPEARANCEUR CLEAR 03/15/2021 0620   LABSPEC 1.005 03/15/2021 0620   PHURINE 5.0 03/15/2021 0620   GLUCOSEU NEGATIVE 03/15/2021 0620   HGBUR NEGATIVE 03/15/2021 0620   BILIRUBINUR NEGATIVE 03/15/2021 0620   KETONESUR NEGATIVE 03/15/2021 0620   PROTEINUR NEGATIVE 03/15/2021 0620   UROBILINOGEN 0.2 11/26/2014 2053   NITRITE NEGATIVE 03/15/2021 0620   LEUKOCYTESUR NEGATIVE 03/15/2021 9326      Imaging Results:    CT Head Wo Contrast  Result Date: 03/24/2022 CLINICAL DATA:  Mental status change, unknown cause. Family reported pt started drooling today but pt's granddaughter told ems pt drools when she drinks alcohol; bottle of beer found beside pt when ems arrived Pt admits to drinking a 40 oz of beer Pt is actively drooling but is alert EXAM: CT HEAD WITHOUT CONTRAST TECHNIQUE: Contiguous axial images were obtained from the base of the skull through the vertex without intravenous contrast. RADIATION DOSE REDUCTION: This exam was performed according to the departmental dose-optimization program which includes automated exposure control, adjustment of the mA and/or kV according to patient size and/or use of iterative reconstruction technique. COMPARISON:  CT head 07/16/2021 BRAIN: BRAIN Cerebral ventricle sizes are concordant with the degree of cerebral volume loss. No evidence of large-territorial acute infarction. No parenchymal hemorrhage. No mass lesion. No extra-axial collection. No mass effect or midline shift. No hydrocephalus. Basilar cisterns are patent. Vascular: No hyperdense vessel. Skull: No acute fracture or focal lesion. Sinuses/Orbits: Paranasal sinuses  and mastoid air cells are clear. The orbits are unremarkable. Other: None. IMPRESSION: No acute intracranial abnormality. Electronically Signed   By: Iven Finn M.D.   On: 03/24/2022 17:49    My personal review of EKG: Rhythm NSR, QTc 500   Assessment & Plan:    Principal Problem:   Seizure ([place])   Seizure disorder-patient had breakthrough seizures x2 in the last 5 days.Tele neurology consulted, recommended to increase Keppra to 750 mg twice daily.  Will change Keppra to 750 mg IV twice daily.  Patient is currently n.p.o. due to drooling.  We will also initiate lorazepam 2 mg IV every 4 hours as needed for seizure. Mouth drooling-concern for CVA, CT head was unremarkable.  MRI brain without contrast have been ordered as per neurology recommendation.  We will keep patient n.p.o. and obtain swallow evaluation in a.m.  Neurology recommends Plavix 300 mg and aspirin 81 mg now and then aspirin 81 mg and 75 mg  starting tomorrow. Hypoglycemia-patient blood glucose is low in 80s, will start D5 half-normal saline at 75 mill per hour.  Check CBG every 4 hours. History of alcohol abuse-patient drinks alcohol every day.  Will start Ativan per CIWA protocol. Goals of care-patient is currently enrolled in hospice for dementia.  Consider palliative care consultation in a.m. after MRI brain as well as swallow evaluation for further discussion regarding artificial nutrition. COPD-at baseline, continue Symbicort, albuterol inhaler as needed.    DVT Prophylaxis-   Lovenox   AM Labs Ordered, also please review Full Orders  Family Communication: Admission, patients condition and plan of care including tests being ordered have been discussed with the patient and her daughter at bedside who indicate understanding and agree with the plan and Code Status.  Code Status: DNR  Admission status: Observation     * I certify that at the point of admission it is my clinical judgment that the patient will require  inpatient hospital care spanning beyond 2 midnights from the point of admission due to high intensity of service, high risk for further deterioration and high frequency of surveillance required.*  Time spent in minutes : 60 minutes   Kara Melching S Lacy Taglieri M.D

## 2022-03-24 NOTE — ED Notes (Signed)
Tele Neuro consult

## 2022-03-24 NOTE — ED Triage Notes (Signed)
Pt brought in by [facility] for c/o drooling  Cbg 117  Family reported pt started drooling today but pt's granddaughter told ems pt drools when she drinks alcohol; bottle of beer found beside pt when ems arrived   Pt admits to drinking a 40 oz of beer  Pt is actively drooling but is alert  Pt denies any pain

## 2022-03-24 NOTE — ED Notes (Signed)
Purple armband placed on pt 

## 2022-03-24 NOTE — Consult Note (Addendum)
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 Concern for new CVA as the etiology of breakthrough sz. Rec toxic metabolic infectious workup (U/A, UDS etc as per primary team/ED provider). Rec plavix load of 300 mg and ASA 81 mg now and then ASA 81 and plavix 75 starting tomorrow. Cont home Keppra but increase it to 750 mg bid and cont home lyrica for now. MRI brain wo ordered to eval for CVA.  - Brain MRI without contrast - Seizure precautions - Give Lorazepam 2 mg IV PRN for seizures > 3 min or > 3 episodes in one hour. Don't give more than 6-8 mg total in 24 hour period).  Please avoid aminophylline, theophylline, isoniazid, lindane, metronidazole, nalidixic acid, meropenem/imipenem, cefepime, TCAs, bupropion, cyclosporine, chlorambucil, tramadol, clozapine, or other drugs which can lower seizure threshold.    Recommendations: Our recommendations are outlined below.  Diagnostic Studies : MRI brain without contrast I ordered  Nursing Recommendations : Maintain Euglycemia and EuthermiaNeuro checks q1-2 hrs during ICU stay if criticalOnce stable neuro checks q 4hrs  Seizure precautions : Seizure precautions including no driving for state mandated time frame were discussed with patient with clear understanding  DVT Prophylaxis : Lovenox or LMW Heparin  Disposition : Neurology will follow   ----------------------------------------------------------------------------------------------------    Metrics: [facility]  Notification Time: 03/24/2022 19:11:56 Stamp Time: 03/24/2022 19:14:00 Callback Response Time: 03/24/2022 19:14:14  Primary Provider Notified of Diagnostic Impression and Management Plan on: 03/24/2022 21:04:11   CT HEAD: Reviewed no acute pathology    ----------------------------------------------------------------------------------------------------  Chief Complaint: drooling and face weakness and breakthrough sz  History of Present Illness: Patient is a 66 year old Female. 51F presents with drooling and face weakness and breakthrough sz. LKW Friday. Hx seizure, had a breakthrough seizure on Friday at some point and Monday morning about 3 am. Since then has been drooling, had a facial droop since Friday. Also reports change in taste.  On Keppra 500 mg bid and lyrica for hx sz. Lives with her father and her daughter. Has had sz disorder for a year.  No hx recent illness (SOB, fever, chills, CP, fever, new cough, N/V, diarrhea, etc). Has chronic cough.  Patient is unable to give own history so history taken from review of the medical record and from family.  She is in hospice care.   Past Medical History:      Diabetes Mellitus      Seizures      There is no history of Hypertension      There is no history of Hyperlipidemia      There is no history of Atrial Fibrillation      There is no history of Coronary Artery Disease      There is no history of Stroke Other PMH:  COPD, anemia, alcoholism, dementia on hospice  Medications:  No Anticoagulant use  No Antiplatelet use Reviewed EMR for current medications  Allergies:  Reviewed  Social History: Smoking: No Alcohol Use: yes Drug Use: No  Family History:  There is no family history of premature cerebrovascular  disease pertinent to this consultation  ROS : 14 Points Review of Systems was performed and was negative except mentioned in HPI.  Past Surgical History: There Is No Surgical History Contributory To  Today's Visit   Examination: BP(107/90), Pulse(94),  Neuro Exam: General: Alert,Awake, Oriented to Time, Place, Person  Speech: Fluent:  Language: Intact:  Face: Facial Droop:  Facial Sensation: Intact:  Visual Fields: Intact:  Extraocular Movements: Intact:  Motor Exam: Drift:  Sensation: Intact:  Coordination: Intact:  stooped posture, nonambulatory at baseline,   Spoke with : Dr. Rhunette Croft    Patient / Family was informed the Neurology Consult would occur via TeleHealth consult by way of interactive audio and video telecommunications and consented to receiving care in this manner.  Patient is being evaluated for possible acute neurologic impairment and high probability of imminent or life - threatening deterioration.I spent total of 35 minutes providing care to this patient, including time for face to face visit via telemedicine, review of medical records, imaging studies and discussion of findings with providers, the patient and / or family.   Dr Amedeo Kinsman   [facility] For Inpatient follow-up with [facility] physician please call [facility] [phone]. This is not an outpatient service. Post hospital discharge, please contact hospital directly.

## 2022-03-25 ENCOUNTER — Observation Stay (HOSPITAL_COMMUNITY)

## 2022-03-25 DIAGNOSIS — R569 Unspecified convulsions: Secondary | ICD-10-CM | POA: Diagnosis not present

## 2022-03-25 DIAGNOSIS — G9340 Encephalopathy, unspecified: Secondary | ICD-10-CM | POA: Diagnosis not present

## 2022-03-25 DIAGNOSIS — R1310 Dysphagia, unspecified: Secondary | ICD-10-CM

## 2022-03-25 LAB — COMPREHENSIVE METABOLIC PANEL
ALT: 20 U/L (ref 0–44)
AST: 48 U/L — ABNORMAL HIGH (ref 15–41)
Albumin: 2 g/dL — ABNORMAL LOW (ref 3.5–5.0)
Alkaline Phosphatase: 127 U/L — ABNORMAL HIGH (ref 38–126)
Anion gap: 7 (ref 5–15)
BUN: <5 mg/dL — ABNORMAL LOW (ref 8–23)
CO2: 25 mmol/L (ref 22–32)
Calcium: 8 mg/dL — ABNORMAL LOW (ref 8.9–10.3)
Chloride: 102 mmol/L (ref 98–111)
Creatinine, Ser: <0.3 mg/dL — ABNORMAL LOW (ref 0.44–1.00)
Glucose, Bld: 89 mg/dL (ref 70–99)
Potassium: 3.2 mmol/L — ABNORMAL LOW (ref 3.5–5.1)
Sodium: 134 mmol/L — ABNORMAL LOW (ref 135–145)
Total Bilirubin: 1.2 mg/dL (ref 0.3–1.2)
Total Protein: 5.2 g/dL — ABNORMAL LOW (ref 6.5–8.1)

## 2022-03-25 LAB — CBC
HCT: 28.8 % — ABNORMAL LOW (ref 36.0–46.0)
Hemoglobin: 10.5 g/dL — ABNORMAL LOW (ref 12.0–15.0)
MCH: 31 pg (ref 26.0–34.0)
MCHC: 36.5 g/dL — ABNORMAL HIGH (ref 30.0–36.0)
MCV: 85 fL (ref 80.0–100.0)
Platelets: 149 10*3/uL — ABNORMAL LOW (ref 150–400)
RBC: 3.39 MIL/uL — ABNORMAL LOW (ref 3.87–5.11)
RDW: 12.9 % (ref 11.5–15.5)
WBC: 3.3 10*3/uL — ABNORMAL LOW (ref 4.0–10.5)
nRBC: 0 % (ref 0.0–0.2)

## 2022-03-25 LAB — GLUCOSE, CAPILLARY
Glucose-Capillary: 80 mg/dL (ref 70–99)
Glucose-Capillary: 93 mg/dL (ref 70–99)
Glucose-Capillary: 95 mg/dL (ref 70–99)
Glucose-Capillary: 99 mg/dL (ref 70–99)

## 2022-03-25 MED ORDER — SODIUM CHLORIDE 0.9 % IV SOLN
750.0000 mg | Freq: Two times a day (BID) | INTRAVENOUS | Status: DC
  Administered 2022-03-25 (×2): 750 mg via INTRAVENOUS
  Filled 2022-03-25 (×4): qty 7.5

## 2022-03-25 MED ORDER — NICOTINE 7 MG/24HR TD PT24: 7.0000 mg | MEDICATED_PATCH | Freq: Every day | TRANSDERMAL | 0 refills | Status: AC

## 2022-03-25 MED ORDER — CYCLOBENZAPRINE HCL 10 MG PO TABS
5.0000 mg | ORAL_TABLET | Freq: Once | ORAL | Status: AC
  Administered 2022-03-25: 5 mg via ORAL
  Filled 2022-03-25: qty 1

## 2022-03-25 MED ORDER — NICOTINE 7 MG/24HR TD PT24
7.0000 mg | MEDICATED_PATCH | Freq: Every day | TRANSDERMAL | Status: DC
  Administered 2022-03-25 (×2): 7 mg via TRANSDERMAL
  Filled 2022-03-25 (×2): qty 1

## 2022-03-25 MED ORDER — LEVETIRACETAM 500 MG/5ML IV SOLN
INTRAVENOUS | Status: AC
  Filled 2022-03-25: qty 10

## 2022-03-25 MED ORDER — LEVETIRACETAM 750 MG PO TABS: 750.0000 mg | ORAL_TABLET | Freq: Two times a day (BID) | ORAL | 1 refills | Status: AC

## 2022-03-25 NOTE — Progress Notes (Signed)
Pt has discharge orders, discharge teaching given to pt and daughter bedside, no further questions at this time. Pt wheeled down to main entrance via wheelchair by staff to vehicle accompanied by daughter.

## 2022-03-25 NOTE — Progress Notes (Addendum)
[facility]) Hospital Note  Gloria Santana is a current patient with [facility] services at home, with a terminal diagnosis of severe protein calorie malnutrition with abnormal weight loss related to chronic alcoholism, chronic pancreatitis, seizures, Wernicke's dementia with behavioral disturbances, steatohepatitis, coronary artery disease, COPD, anemia of chronic disease, diabetes mellitus with other complications, and hypertension.    Patient was brought in for evaluation of increased drooling s/p seizure Friday 10.6 and again early morning of 10.10.  Admission for further workup by neurology, SLP evaluation.  Per ACC MD, Dr. Cherie Santana, this admission is related to terminal illness.    Visited patient at bedside.  Patient asleep mouth open, no signs of distress, suction set up at bedside and towels placed under patient's mouth. Husband at bedside, no concerns noted from him.  Informed him that Gloria Santana team will be following throughout patients hospital stay.    GIP appropriate as patient is receiving IVF and IV levetiracetam.  VS  97.1 oral 90/64 HR 108 RR 14 98% [place]@1LPM   I&O +610.2 mL   Labs Na 134 K+ 3.2 BUN<5 Cr <0.30 Alkaline Phos 127 Albumin 2.0 Total protein 5.2 WBC 3.3 Hgb 10.5 Glu 89  Diagnostics CT head: no acute intracranial abnormality.  MRI Brain: IMPRESSION: 1. No acute intracranial abnormality. 2. Mild chronic small vessel ischemic disease. 3. Moderately advanced cerebral atrophy. 4. Absent flow void in the right transverse and sigmoid sinuses which may reflect slow flow or occlusion.  IV/PRN Medications: D5 1/2NS @75  mL/h Levetiracetam 750mg  BID Lorazepam 1-4mg  q1h PRN  Problems List: - Seizure activity: 2 seizures in last 5 days, IV Keppra ordered BID. Withdrawal protocol initiated for history of ETOH use (40 oz at bedside when EMS picked patient up) - Increased drooling: concerned for CVA, Neurology  consulted, order for MRI of brain, and recommended Plavix 300mg  and aspirin 81mg  and 75mg  starting today.  Patient in NPO for SLP eval.   - Hypoglycemia: blood glucose has been in the 80s since admission, D5 1/2NS started, CBG checks q4h.   - COPD at baseline continue routine meds.  Goals of Care: Neurology workup, SLP evaluation and treat treatable.    Family contact  Hospital staff updated.  IDT updated.   Medication list and Transfer summary placed in patient chart.    Thank you for allowing [place] to participate in Gloria Santana care.    Kenna Gilbert BSN, RN  [facility]  [phone] Am[facility]Hospice

## 2022-03-25 NOTE — Discharge Summary (Signed)
Physician Discharge Summary   Patient: Gloria Santana MRN: [id number] DOB: [birth year]  Admit date:     03/24/2022  Discharge date: 03/25/22  Discharge Physician: Barton Dubois   PCP: Arthur Holms, NP   Recommendations at discharge:  Continue supportive care and symptomatic management only -Resumption of hospice at time of discharge requested  Discharge Diagnoses: Principal Problem:   Seizure [facility]) Active Problems:   Encephalopathy   Dysphagia Severe protein calorie malnutrition.  Brief Hospital admission course: As per H&P written by Dr. Almeta Monas on 03/24/2022 Gloria Santana  is a 66 y.o. female, with history of COPD, anemia, alcoholism, dementia, hypertension, seizure disorder on Keppra was brought to the ED for evaluation for drooling from the mouth.  Patient's family states that she had a seizure last Friday and then again once again this early morning.  Since the seizure on Friday close change in her mental status and behavior.  She has been drooling more.  Patient does drink 1 beer every day.  She is currently enrolled in hospice.  Patient is DNR. There is no history of nausea vomiting. Denies abdominal pain. Denies dysuria In the ED CT of the head was unremarkable. Due to constant drooling, tele neurology was consulted; recommended MRI brain to rule out stroke.  Assessment and Plan: 1-seizure disorder -Patient with prior history of seizure presenting after having 2 witnessed seizure activity by family members and another seizure event in the emergency department. -CT scan and MRI of the brain demonstrating no acute intracranial abnormalities -Neurology service consulted with recommendation to increase in Keppra dose to 750 mg twice a day. -No further seizure activity appreciated throughout hospitalization -Continue supportive care and outpatient hospice management.  2-history of alcohol abuse -No active withdrawal appreciated -Recommendation for abstinence in order to  prevent decreased seizure threshold provided. -Per reports by family of patient drinking significantly less these days.  3-COPD -No acute exacerbation appreciated -Good saturation on room air -Continue home bronchodilator management.  4-history of dementia -Continue supportive care and assistance -Patient enrolled in hospice.  5-dysphagia and drooling -Seen by speech therapy who has recommended dysphagia 3 mechanical soft diet with thin liquids. -Continue supportive care and feeding assistance -Medications to be given as a whole or crush in applesauce.  6-severe protein calorie malnutrition -In the setting of chronic disease and impair oral intake with ongoing dementia and dysphagia -Continue hospice care and symptomatic management -Continue adjusted diet consistency -Body mass index is 16.44 kg/m.  Consultants: Neurology Procedures performed: See below for x-ray reports Disposition: Home with hospice Diet recommendation: Dysphagia 3 diet with thin liquids  DISCHARGE MEDICATION: Allergies as of 03/25/2022   No Known Allergies      Medication List     TAKE these medications    acetaminophen 500 MG tablet Commonly known as: TYLENOL Take 2 tablets (1,000 mg total) by mouth every 6 (six) hours.   albuterol 108 (90 Base) MCG/ACT inhaler Commonly known as: VENTOLIN HFA Inhale 1-2 puffs into the lungs every 6 (six) hours as needed for wheezing or shortness of breath.   albuterol (2.5 MG/3ML) 0.083% nebulizer solution Commonly known as: PROVENTIL Take 2.5 mg by nebulization every 6 (six) hours as needed for wheezing or shortness of breath.   haloperidol 5 MG tablet Commonly known as: HALDOL Take 5 mg by mouth every 4 (four) hours as needed.   hydrOXYzine 10 MG tablet Commonly known as: ATARAX Take 10 mg by mouth every 4 (four) hours as needed.   levETIRAcetam 750 MG  tablet Commonly known as: KEPPRA Take 1 tablet (750 mg total) by mouth 2 (two) times daily. What  changed:  medication strength how much to take   nicotine 7 mg/24hr patch Commonly known as: NICODERM CQ - dosed in mg/24 hr Place 1 patch (7 mg total) onto the skin daily. Start taking on: March 26, 2022   pregabalin 25 MG capsule Commonly known as: LYRICA Take 25 mg by mouth 2 (two) times daily.   Symbicort 160-4.5 MCG/ACT inhaler Generic drug: budesonide-formoterol Inhale 1 puff into the lungs 2 (two) times daily as needed (shortness of breath).        Follow-up Information     Arthur Holms, NP. Schedule an appointment as soon as possible for a visit in 10 day(s).   Specialty: Nurse Practitioner Contact information: [place] [address] [phone]                Discharge Exam: Danley Danker Weights   03/24/22 1655 03/25/22 0008  Weight: 40.8 kg 47.6 kg   General exam: Chronically ill in appearance, cachectic, frail and in no acute distress.  Patient afebrile and following commands appropriately.  Demonstrating some difficulty enunciating certain words and demonstrating ongoing drooling. Respiratory system: No using accessory muscles; no wheezing.  Positive scattered rhonchi.  Good saturation on room air. Cardiovascular system:RRR. No rubs or gallops. Gastrointestinal system: Abdomen is nondistended, soft and nontender. No organomegaly or masses felt. Normal bowel sounds heard. Central nervous system: No new focal neurologic deficits. Extremities: No cyanosis or clubbing. Skin: No petechiae. Psychiatry: Mood & affect appropriate.    Condition at discharge: In stable conditions.  No further seizure activity appreciated and patient in no acute distress.  The results of significant diagnostics from this hospitalization (including imaging, microbiology, ancillary and laboratory) are listed below for reference.   Imaging Studies: MR BRAIN WO CONTRAST  Result Date: 03/25/2022 CLINICAL DATA:  Facial weakness, seizure, and drooling. EXAM: MRI HEAD  WITHOUT CONTRAST TECHNIQUE: Multiplanar, multiecho pulse sequences of the brain and surrounding structures were obtained without intravenous contrast. COMPARISON:  Head CT 03/24/2022 FINDINGS: Brain: There is no evidence of an acute infarct, intracranial hemorrhage, mass, midline shift, or extra-axial fluid collection. There is moderately advanced cerebral and cerebellar atrophy. T2 hyperintensities in the cerebral white matter are nonspecific but compatible with mild chronic small vessel ischemic disease. Vascular: Major intracranial arterial flow voids are preserved. Absent flow void in the right transverse and sigmoid sinuses. Skull and upper cervical spine: No suspicious marrow lesion. Sinuses/Orbits: Unremarkable orbits. Minimal mucosal thickening in the paranasal sinuses. Small to moderate right and small left mastoid effusions. Other: None. IMPRESSION: 1. No acute intracranial abnormality. 2. Mild chronic small vessel ischemic disease. 3. Moderately advanced cerebral atrophy. 4. Absent flow void in the right transverse and sigmoid sinuses which may reflect slow flow or occlusion. Electronically Signed   By: Logan Bores M.D.   On: 03/25/2022 10:22   CT Head Wo Contrast  Result Date: 03/24/2022 CLINICAL DATA:  Mental status change, unknown cause. Family reported pt started drooling today but pt's granddaughter told ems pt drools when she drinks alcohol; bottle of beer found beside pt when ems arrived Pt admits to drinking a 40 oz of beer Pt is actively drooling but is alert EXAM: CT HEAD WITHOUT CONTRAST TECHNIQUE: Contiguous axial images were obtained from the base of the skull through the vertex without intravenous contrast. RADIATION DOSE REDUCTION: This exam was performed according to the departmental dose-optimization program which includes  automated exposure control, adjustment of the mA and/or kV according to patient size and/or use of iterative reconstruction technique. COMPARISON:  CT head  07/16/2021 BRAIN: BRAIN Cerebral ventricle sizes are concordant with the degree of cerebral volume loss. No evidence of large-territorial acute infarction. No parenchymal hemorrhage. No mass lesion. No extra-axial collection. No mass effect or midline shift. No hydrocephalus. Basilar cisterns are patent. Vascular: No hyperdense vessel. Skull: No acute fracture or focal lesion. Sinuses/Orbits: Paranasal sinuses and mastoid air cells are clear. The orbits are unremarkable. Other: None. IMPRESSION: No acute intracranial abnormality. Electronically Signed   By: Iven Finn M.D.   On: 03/24/2022 17:49    Microbiology: Results for orders placed or performed during the hospital encounter of 07/16/20  SARS CORONAVIRUS 2 (TAT 6-24 HRS) Nasopharyngeal Nasopharyngeal Swab     Status: Abnormal   Collection Time: 07/16/20  2:15 PM   Specimen: Nasopharyngeal Swab  Result Value Ref Range Status   SARS Coronavirus 2 POSITIVE (A) NEGATIVE Final    Comment: (NOTE) SARS-CoV-2 target nucleic acids are DETECTED.  The SARS-CoV-2 RNA is generally detectable in upper and lower respiratory specimens during the acute phase of infection. Positive results are indicative of the presence of SARS-CoV-2 RNA. Clinical correlation with patient history and other diagnostic information is  necessary to determine patient infection status. Positive results do not rule out bacterial infection or co-infection with other viruses.  The expected result is Negative.  Fact Sheet for Patients: SugarRoll.be  Fact Sheet for Healthcare Providers: https://www.woods-mathews.com/  This test is not yet approved or cleared by the [place] FDA and  has been authorized for detection and/or diagnosis of SARS-CoV-2 by FDA under an Emergency Use Authorization (EUA). This EUA will remain  in effect (meaning this test can be used) for the duration of the COVID-19 declaration under Section 564(b)(1)  of the Act, 21 U. S.C. section 360bbb-3(b)(1), unless the authorization is terminated or revoked sooner.   Performed at [facility], [place] [address], [place], [place] [address]     Labs: CBC: Recent Labs  Lab 03/24/22 1805 03/25/22 0421  WBC 3.7* 3.3*  NEUTROABS 1.5*  --   HGB 11.6* 10.5*  HCT 32.5* 28.8*  MCV 86.2 85.0  PLT 175 123456*   Basic Metabolic Panel: Recent Labs  Lab 03/24/22 1805 03/25/22 0421  NA 135 134*  K 3.5 3.2*  CL 100 102  CO2 24 25  GLUCOSE 83 89  BUN <5* <5*  CREATININE 0.32* <0.30*  CALCIUM 8.4* 8.0*   Liver Function Tests: Recent Labs  Lab 03/24/22 1805 03/25/22 0421  AST 63* 48*  ALT 23 20  ALKPHOS 160* 127*  BILITOT 1.0 1.2  PROT 6.2* 5.2*  ALBUMIN 2.4* 2.0*   CBG: Recent Labs  Lab 03/25/22 0017 03/25/22 0414 03/25/22 0714 03/25/22 1126  GLUCAP 80 95 93 99    Discharge time spent: greater than 30 minutes.  Signed: Barton Dubois, MD [facility] 03/25/2022

## 2022-03-25 NOTE — Evaluation (Signed)
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[place])    Anemia    COPD (chronic obstructive pulmonary disease) ([place])    Diabetes mellitus without complication ([place])    Pt states she is not diabetic   Fatty liver    Hypertension    Pancreatitis    Poor circulation    Past Surgical History:  Past Surgical History:  Procedure Laterality Date   ABDOMINAL HYSTERECTOMY     VIDEO ASSISTED THORACOSCOPY (VATS)/EMPYEMA Left 09/13/2019   Procedure: VIDEO ASSISTED THORACOSCOPY (VATS) EVACUATION of HEMOTHORAX WITH INTERCOSTAL NERVE BLOCK;  Surgeon: Grace Isaac, MD;  Location: [place];  Service: Thoracic;  Laterality: Left;   VIDEO BRONCHOSCOPY N/A 09/13/2019   Procedure: Video Bronchoscopy;  Surgeon: Grace Isaac, MD;  Location: [facility] OR;  Service: Thoracic;  Laterality: N/A;   HPI:  Gloria Santana  is a 66 y.o. female, with history of COPD, anemia, alcoholism, dementia, hypertension, seizure disorder on Keppra was brought to the ED for evaluation for drooling from the mouth.  Patient's family states that she had a seizure last Friday and then again once again this early morning.  Since the seizure on Friday close change in her mental status and behavior.  She has been drooling more.  Patient does drink 1 beer every day.  She is currently enrolled in hospice.  Patient is DNR. MRI shows: No acute intracranial abnormality. Mild chronic small vessel ischemic disease. Moderately advanced cerebral atrophy. BSE ordered, however Pt passed the [place] swallow screen yesterday.    Assessment / Plan / Recommendation  Clinical Impression  Clinical swallow evaluation completed at  bedside with Pt's spouse present. He indicates that she no longer has her lower dentures and has difficulty masticating some solids. Pt noted to have some drooling and he reports this has been present for over a week. Pt with reduced labial seal and imparied mastication over course of PO trials, however consumed straw sips of thins, purees, and mechanical soft textures with allowance for extra time. Pt is on a heart healthy diet and did not receive salt on her tray and spouse states that she typically won't eat her foods without salt. Consider liberalizing salt restrictions if OK per MD. Recommend D3/mech soft and thin liquids via cup/straw and PO medications whole or crushed as able in puree with feeder assist. Pt with poor head/trunk support and will benefit from feeding assist and a towel beneath her chin due to labial spillage. No further SLP services indicated at this time. SLP will sign off. SLP Visit Diagnosis: Dysphagia, unspecified (R13.10)    Aspiration Risk  Mild aspiration risk    Diet Recommendation Dysphagia 3 (Mech soft);Thin liquid   Liquid Administration via: Cup;Straw Medication Administration: Whole meds with puree Supervision: Staff to assist with self feeding;Full supervision/cueing for compensatory strategies Compensations: Slow rate;Small sips/bites Postural Changes: Seated upright at 90 degrees;Remain upright for at least 30 minutes after po intake    Other  Recommendations Oral Care Recommendations: Oral care BID;Staff/trained caregiver to provide oral care Other Recommendations: Clarify dietary restrictions    Recommendations for follow up therapy are one component of a multi-disciplinary discharge planning process, led by the attending  physician.  Recommendations may be updated based on patient status, additional functional criteria and insurance authorization.  Follow up Recommendations No SLP follow up      Assistance Recommended at Discharge Frequent or constant  Supervision/Assistance  Functional Status Assessment Patient has not had a recent decline in their functional status  Frequency and Duration            Prognosis Prognosis for Safe Diet Advancement: Good      Swallow Study   General Date of Onset: 03/24/22 HPI: Gloria Santana  is a 66 y.o. female, with history of COPD, anemia, alcoholism, dementia, hypertension, seizure disorder on Keppra was brought to the ED for evaluation for drooling from the mouth.  Patient's family states that she had a seizure last Friday and then again once again this early morning.  Since the seizure on Friday close change in her mental status and behavior.  She has been drooling more.  Patient does drink 1 beer every day.  She is currently enrolled in hospice.  Patient is DNR. MRI shows: No acute intracranial abnormality. Mild chronic small vessel ischemic disease. Moderately advanced cerebral atrophy. BSE ordered, however Pt passed the [facility] swallow screen yesterday. Type of Study: Bedside Swallow Evaluation Previous Swallow Assessment: N/A Diet Prior to this Study: Regular;Thin liquids Temperature Spikes Noted: No Respiratory Status: Room air History of Recent Intubation: No Behavior/Cognition: Alert;Cooperative;Pleasant mood;Requires cueing Oral Cavity Assessment: Within Functional Limits Oral Care Completed by SLP: Recent completion by staff Oral Cavity - Dentition: Dentures, top;Missing dentition (missing lower dentures, lost) Vision: Functional for self-feeding Self-Feeding Abilities: Needs assist Patient Positioning: Upright in bed Baseline Vocal Quality: Normal;Low vocal intensity Volitional Cough: Strong Volitional Swallow: Able to elicit    Oral/Motor/Sensory Function Overall Oral Motor/Sensory Function: Mild impairment Facial ROM: Within Functional Limits Facial Symmetry: Within Functional Limits Facial Strength: Within Functional Limits;Other (Comment) (reduced labial seal) Facial Sensation:  Within Functional Limits Lingual ROM: Within Functional Limits Lingual Symmetry: Within Functional Limits Lingual Strength: Within Functional Limits Lingual Sensation: Within Functional Limits Velum:  (could not visualize) Mandible: Within Functional Limits   Ice Chips Ice chips: Not tested   Thin Liquid Thin Liquid: Within functional limits Presentation: Straw    Nectar Thick Nectar Thick Liquid: Not tested   Honey Thick Honey Thick Liquid: Not tested   Puree Puree: Within functional limits Presentation: Spoon   Solid     Solid: Impaired Presentation: Spoon Oral Phase Impairments: Reduced labial seal;Impaired mastication Oral Phase Functional Implications: Prolonged oral transit;Impaired mastication     Thank you,  Havery Moros, CCC-SLP [phone]  Alonzo Loving 03/25/2022,1:46 PM

## 2022-03-25 NOTE — Progress Notes (Signed)
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    Please call with any questions.  Kenna Gilbert BSN, RN  [facility] Liaison [phone] Amion - hospice

## 2022-03-25 NOTE — TOC Progression Note (Signed)
Transition of Care [facility]) - Progression Note    Patient Details  Name: APOLONIA ELLWOOD MRN: [id number] Date of Birth: Feb 12, 1956  Transition of Care [facility]) CM/SW Contact  Salome Arnt, [place] Phone Number: 03/25/2022, 10:41 AM  Clinical Narrative:  Transition of Care [facility]) Screening Note   Patient Details  Name: KATIA HANNEN Date of Birth: 07-Oct-1955   Transition of Care [facility]) CM/SW Contact:    Salome Arnt, LCSW Phone Number: 03/25/2022, 10:41 AM  TOC received consult for substance abuse. Discussed with MD and will clear and pt is active with hospice and reports drinking 1 beer a day.   Transition of Care Department [facility]) has reviewed patient and no TOC needs have been identified at this time. We will continue to monitor patient advancement through interdisciplinary progression rounds. If new patient transition needs arise, please place a TOC consult.             Expected Discharge Plan and Services                                                 Social Determinants of Health (SDOH) Interventions Housing Interventions: Intervention Not Indicated  Readmission Risk Interventions     No data to display

## 2022-03-26 LAB — HIV ANTIBODY (ROUTINE TESTING W REFLEX): HIV Screen 4th Generation wRfx: NONREACTIVE

## 2022-04-06 ENCOUNTER — Emergency Department (HOSPITAL_COMMUNITY)
Admission: EM | Admit: 2022-04-06 | Discharge: 2022-04-06 | Disposition: A | Discharge status: Home / Self Care | Attending: Emergency Medicine | Admitting: Emergency Medicine

## 2022-04-06 ENCOUNTER — Other Ambulatory Visit: Payer: Self-pay

## 2022-04-06 ENCOUNTER — Encounter (HOSPITAL_COMMUNITY): Payer: Self-pay | Admitting: Emergency Medicine

## 2022-04-06 DIAGNOSIS — Z8719 Personal history of other diseases of the digestive system: Secondary | ICD-10-CM | POA: Insufficient documentation

## 2022-04-06 DIAGNOSIS — R569 Unspecified convulsions: Secondary | ICD-10-CM | POA: Diagnosis present

## 2022-04-06 DIAGNOSIS — F0390 Unspecified dementia without behavioral disturbance: Secondary | ICD-10-CM | POA: Diagnosis not present

## 2022-04-06 DIAGNOSIS — Z79899 Other long term (current) drug therapy: Secondary | ICD-10-CM | POA: Diagnosis not present

## 2022-04-06 DIAGNOSIS — J449 Chronic obstructive pulmonary disease, unspecified: Secondary | ICD-10-CM | POA: Insufficient documentation

## 2022-04-06 DIAGNOSIS — E119 Type 2 diabetes mellitus without complications: Secondary | ICD-10-CM | POA: Diagnosis not present

## 2022-04-06 LAB — CBC WITH DIFFERENTIAL/PLATELET
Abs Immature Granulocytes: 0.01 10*3/uL (ref 0.00–0.07)
Basophils Absolute: 0 10*3/uL (ref 0.0–0.1)
Basophils Relative: 0 %
Eosinophils Absolute: 0.1 10*3/uL (ref 0.0–0.5)
Eosinophils Relative: 1 %
HCT: 36.1 % (ref 36.0–46.0)
Hemoglobin: 12.6 g/dL (ref 12.0–15.0)
Immature Granulocytes: 0 %
Lymphocytes Relative: 23 %
Lymphs Abs: 1.1 10*3/uL (ref 0.7–4.0)
MCH: 30.5 pg (ref 26.0–34.0)
MCHC: 34.9 g/dL (ref 30.0–36.0)
MCV: 87.4 fL (ref 80.0–100.0)
Monocytes Absolute: 1 10*3/uL (ref 0.1–1.0)
Monocytes Relative: 20 %
Neutro Abs: 2.6 10*3/uL (ref 1.7–7.7)
Neutrophils Relative %: 56 %
Platelets: 178 10*3/uL (ref 150–400)
RBC: 4.13 MIL/uL (ref 3.87–5.11)
RDW: 13.7 % (ref 11.5–15.5)
WBC: 4.7 10*3/uL (ref 4.0–10.5)
nRBC: 0 % (ref 0.0–0.2)

## 2022-04-06 LAB — URINALYSIS, ROUTINE W REFLEX MICROSCOPIC
Bilirubin Urine: NEGATIVE
Glucose, UA: NEGATIVE mg/dL
Hgb urine dipstick: NEGATIVE
Ketones, ur: 5 mg/dL — AB
Nitrite: NEGATIVE
Protein, ur: NEGATIVE mg/dL
Specific Gravity, Urine: 1.011 (ref 1.005–1.030)
WBC, UA: >50 WBC/hpf — ABNORMAL HIGH (ref 0–5)
pH: 5 (ref 5.0–8.0)

## 2022-04-06 LAB — COMPREHENSIVE METABOLIC PANEL
ALT: 23 U/L (ref 0–44)
AST: 55 U/L — ABNORMAL HIGH (ref 15–41)
Albumin: 2.5 g/dL — ABNORMAL LOW (ref 3.5–5.0)
Alkaline Phosphatase: 156 U/L — ABNORMAL HIGH (ref 38–126)
Anion gap: 9 (ref 5–15)
BUN: 5 mg/dL — ABNORMAL LOW (ref 8–23)
CO2: 26 mmol/L (ref 22–32)
Calcium: 8.4 mg/dL — ABNORMAL LOW (ref 8.9–10.3)
Chloride: 97 mmol/L — ABNORMAL LOW (ref 98–111)
Creatinine, Ser: 0.34 mg/dL — ABNORMAL LOW (ref 0.44–1.00)
GFR, Estimated: >60 mL/min (ref >60)
Glucose, Bld: 104 mg/dL — ABNORMAL HIGH (ref 70–99)
Potassium: 3.9 mmol/L (ref 3.5–5.1)
Sodium: 132 mmol/L — ABNORMAL LOW (ref 135–145)
Total Bilirubin: 1.3 mg/dL — ABNORMAL HIGH (ref 0.3–1.2)
Total Protein: 6.5 g/dL (ref 6.5–8.1)

## 2022-04-06 LAB — CBG MONITORING, ED: Glucose-Capillary: 102 mg/dL — ABNORMAL HIGH (ref 70–99)

## 2022-04-06 NOTE — Progress Notes (Signed)
[facility] ED [facility]) Hospital Liaison Note   This is a current [facility] hospice patient. Please call with any hospice questions or concerns. Thank you.   Zigmund Gottron, RN [facility] Liaison [phone]

## 2022-04-06 NOTE — Discharge Instructions (Signed)
As discussed, it is possible you had a seizure today even though it did not look like you are prior seizures.  Since you are back to your baseline it is safe for you to return home.  However I do recommend close follow-up with your primary provider and hospice services, call them tomorrow to let them know of today's events.  We are asking that you increase your Keppra to 1000 mg twice daily as discussed.

## 2022-04-06 NOTE — ED Provider Notes (Signed)
[facility] EMERGENCY DEPARTMENT Provider Note   CSN: [id number] Arrival date & time: 04/06/22  1309     History  Chief Complaint  Patient presents with   Shaking    Gloria Santana is a 66 y.o. female with a history including COPD, history of alcohol dependence with alcohol induced dementia per family at bedside, type 2 diabetes, history of pancreatitis, currently under the care of hospice who lives at home with her daughter, presenting here for an episode of shaking and unresponsiveness which lasted for about 20 minutes about an hour and a half prior to arrival.  Daughter contacted hospice care, but a provider was unable to come out until after 2 PM today so they elected to come here for further evaluation.  Patient symptoms have since resolved.  Although she does have a history of seizure for which she takes Keppra, daughter endorses that today's episode was not seizure-like.  She states that she had an exaggerated tremor to her head and hands and although her eyes were open and blinking she would not respond to the daughter.  Since arriving here she has returned to her baseline status.  She has had no known fevers or chills, patient denies headache, chest pain, shortness of breath and abdominal pain.   HPI     Home Medications Prior to Admission medications   Medication Sig Start Date End Date Taking? Authorizing Provider  acetaminophen (TYLENOL) 500 MG tablet Take 2 tablets (1,000 mg total) by mouth every 6 (six) hours. 09/16/19   Domenic Polite, MD  albuterol (PROVENTIL) (2.5 MG/3ML) 0.083% nebulizer solution Take 2.5 mg by nebulization every 6 (six) hours as needed for wheezing or shortness of breath. Patient not taking: Reported on 03/24/2022    [provider]  albuterol (VENTOLIN HFA) 108 (90 Base) MCG/ACT inhaler Inhale 1-2 puffs into the lungs every 6 (six) hours as needed for wheezing or shortness of breath.    [provider]  haloperidol (HALDOL) 5 MG  tablet Take 5 mg by mouth every 4 (four) hours as needed. 02/16/22   [provider]  hydrOXYzine (ATARAX) 10 MG tablet Take 10 mg by mouth every 4 (four) hours as needed. 02/13/22   [provider]  levETIRAcetam (KEPPRA) 750 MG tablet Take 1 tablet (750 mg total) by mouth 2 (two) times daily. 03/25/22   Barton Dubois, MD  nicotine (NICODERM CQ - DOSED IN MG/24 HR) 7 mg/24hr patch Place 1 patch (7 mg total) onto the skin daily. 03/26/22   Barton Dubois, MD  pregabalin (LYRICA) 25 MG capsule Take 25 mg by mouth 2 (two) times daily.    [provider]  SYMBICORT 160-4.5 MCG/ACT inhaler Inhale 1 puff into the lungs 2 (two) times daily as needed (shortness of breath). 06/14/21   [provider]      Allergies    Patient has no known allergies.    Review of Systems   Review of Systems  Unable to perform ROS: Dementia  All other systems reviewed and are negative.   Physical Exam Updated Vital Signs BP 103/74 (BP Location: Left Arm)   Pulse (!) 106   Temp 98.5 F (36.9 C) (Oral)   Resp 12   LMP  (LMP Unknown)   SpO2 100%  Physical Exam Vitals and nursing note reviewed.  Constitutional:      Appearance: She is well-developed.     Comments: Pleasantly confused.  She does have a chronic mild drool which per review of chart  is not a new finding.  HENT:     Head: Normocephalic and atraumatic.     Mouth/Throat:     Mouth: Mucous membranes are moist.  Eyes:     Conjunctiva/sclera: Conjunctivae normal.  Cardiovascular:     Rate and Rhythm: Normal rate and regular rhythm.     Heart sounds: Normal heart sounds.  Pulmonary:     Effort: Pulmonary effort is normal.     Breath sounds: Normal breath sounds. No wheezing.  Abdominal:     General: Bowel sounds are normal.     Palpations: Abdomen is soft.     Tenderness: There is no abdominal tenderness.  Musculoskeletal:        General: Normal range of motion.     Cervical back: Normal range of motion.   Skin:    General: Skin is warm and dry.  Neurological:     Mental Status: She is alert. Mental status is at baseline.     Comments: Moves all extremities without deficit.     ED Results / Procedures / Treatments   Labs (all labs ordered are listed, but only abnormal results are displayed) Labs Reviewed  COMPREHENSIVE METABOLIC PANEL - Abnormal; Notable for the following components:      Result Value   Sodium 132 (*)    Chloride 97 (*)    Glucose, Bld 104 (*)    BUN 5 (*)    Creatinine, Ser 0.34 (*)    Calcium 8.4 (*)    Albumin 2.5 (*)    AST 55 (*)    Alkaline Phosphatase 156 (*)    Total Bilirubin 1.3 (*)    All other components within normal limits  URINALYSIS, ROUTINE W REFLEX MICROSCOPIC - Abnormal; Notable for the following components:   Color, Urine AMBER (*)    APPearance CLOUDY (*)    Ketones, ur 5 (*)    Leukocytes,Ua LARGE (*)    WBC, UA >50 (*)    Bacteria, UA RARE (*)    All other components within normal limits  CBG MONITORING, ED - Abnormal; Notable for the following components:   Glucose-Capillary 102 (*)    All other components within normal limits  URINE CULTURE  CBC WITH DIFFERENTIAL/PLATELET    EKG None  Radiology No results found.  Procedures Procedures    Medications Ordered in ED Medications - No data to display  ED Course/ Medical Decision Making/ A&P                           Medical Decision Making Amount and/or Complexity of Data Reviewed Labs: ordered.   Medical Decision Making Amount and/or Complexity of Data Reviewed Labs: ordered. Radiology: ordered.     This patient presents to the ED for concern of AMS, now resolved in a patient with baseline dementia and h/o seizures, this involves an extensive number of treatment options, and is a complaint that carries with it a high risk of complications and morbidity.  The differential diagnosis includes cva/tia -now resolved, different seizure presentation, advancing dementia,  infection such as uti, sepsis.    Co morbidities that complicate the patient evaluation   Alcohol induced Dementia, seizure disorder, DM     Additional history obtained:   Additional history obtained from daughter at bedside     Lab Tests:   I Ordered, and personally interpreted labs.  The pertinent results include: C-Met revealing for an alk phos of 156, this is not a  new finding.  She does have a mild hyponatremia at 132.  She does not appear to be dehydrated, she has a low normal creatinine function.  CBC is normal with a WBC count of 4.7 and a hemoglobin of 12.6.  She does have a large number of leukocytes in her urine rare bacteria, no nitrites, no complaint of dysuria by patient and family member endorses no obvious changes in urination at home.  We will send urine culture, I do not think this represents a UTI.             Problem List / ED Course / Critical interventions / Medication management   ED course: Patient remained alert and at baseline, reconfirmed by an additional family member that arrived towards the end of her course.  No obvious seizure activity here. I ordered medication including:  none Reevaluation of the patient after these medicines showed that the patient stayed the same I have reviewed the patients home medicines and have made adjustments as needed.  Concern for possible new seizure as source of today's transient symptoms.  Patients caregivers were advised we were increasing her Keppra dose and she should follow-up with her PCP and/or hospice services whom she is under their care.    Social Determinants of Health:   dementia     Test / Admission - Considered:   Considered admission to the hospital patient is very stable appearing, family agrees to take him home and return if things get worse         Final Clinical Impression(s) / ED Diagnoses Final diagnoses:  Seizure [facility])    Rx / DC Orders ED Discharge Orders     None          Landis Martins 75/05/10 [id number]    Gray, [place], DO 24/79/98 0012

## 2022-04-06 NOTE — ED Triage Notes (Signed)
Pt on hospice care at home. Ems states family came in and saw her shaking, called Hospice and was not able to come see her until 2pm, per ems family stated that was unacceptable and so called ems. Pt arrived a/o to most. Pt states she did not want to come to ED. Pt drooling which is her normal. Denies pain. No shaking movements noted. Per ems no seizure like activity noted with them.

## 2022-04-06 NOTE — ED Notes (Addendum)
Pt daughter at bedside. Stated she was scared. Stated pt would just stare and not answer her for about 20 minutes per daughter. Advised since pt a/o x 4 that she had to make her own decision. Pt states "I dont want to be here". Daughter supports mother. EDP aware.

## 2022-04-07 LAB — URINE CULTURE: Culture: NO GROWTH

## 2022-07-16 DEATH — deceased
